# Patient Record
Sex: Female | Born: 1985
Health system: Southern US, Community
[De-identification: ages and names within clinical notes are randomized; demographics above are authoritative.]

## PROBLEM LIST (undated history)

## (undated) DIAGNOSIS — O99891 Other specified diseases and conditions complicating pregnancy: Secondary | ICD-10-CM

## (undated) DIAGNOSIS — B009 Herpesviral infection, unspecified: Secondary | ICD-10-CM

## (undated) DIAGNOSIS — O98519 Other viral diseases complicating pregnancy, unspecified trimester: Secondary | ICD-10-CM

## (undated) DIAGNOSIS — I509 Heart failure, unspecified: Secondary | ICD-10-CM

## (undated) DIAGNOSIS — Z8659 Personal history of other mental and behavioral disorders: Secondary | ICD-10-CM

## (undated) DIAGNOSIS — Z87898 Personal history of other specified conditions: Secondary | ICD-10-CM

## (undated) DIAGNOSIS — Z3490 Encounter for supervision of normal pregnancy, unspecified, unspecified trimester: Secondary | ICD-10-CM

## (undated) DIAGNOSIS — A549 Gonococcal infection, unspecified: Secondary | ICD-10-CM

## (undated) DIAGNOSIS — O9989 Other specified diseases and conditions complicating pregnancy, childbirth and the puerperium: Secondary | ICD-10-CM

## (undated) DIAGNOSIS — O280 Abnormal hematological finding on antenatal screening of mother: Secondary | ICD-10-CM

## (undated) DIAGNOSIS — Z720 Tobacco use: Secondary | ICD-10-CM

## (undated) DIAGNOSIS — E0500 Thyrotoxicosis with diffuse goiter without thyrotoxic crisis or storm: Secondary | ICD-10-CM

## (undated) DIAGNOSIS — F99 Mental disorder, not otherwise specified: Secondary | ICD-10-CM

## (undated) DIAGNOSIS — F419 Anxiety disorder, unspecified: Secondary | ICD-10-CM

## (undated) HISTORY — DX: Herpesviral infection, unspecified: O98.519

## (undated) HISTORY — PX: TONSILLECTOMY: SUR1361

## (undated) HISTORY — DX: Personal history of other mental and behavioral disorders: Z86.59

## (undated) HISTORY — DX: Other specified diseases and conditions complicating pregnancy: O99.891

## (undated) HISTORY — DX: Heart failure, unspecified: I50.9

## (undated) HISTORY — DX: Thyrotoxicosis with diffuse goiter without thyrotoxic crisis or storm: E05.00

## (undated) HISTORY — DX: Mental disorder, not otherwise specified: F99

## (undated) HISTORY — DX: Abnormal hematological finding on antenatal screening of mother: O28.0

## (undated) HISTORY — DX: Personal history of other specified conditions: Z87.898

## (undated) HISTORY — PX: TOE SURGERY: SHX1073

## (undated) HISTORY — DX: Herpesviral infection, unspecified: B00.9

## (undated) HISTORY — DX: Other specified diseases and conditions complicating pregnancy, childbirth and the puerperium: O99.89

---
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
## 2008-05-02 ENCOUNTER — Emergency Department (HOSPITAL_COMMUNITY): Admission: EM | Admit: 2008-05-02 | Discharge: 2008-05-02 | Payer: Self-pay | Admitting: Emergency Medicine

## 2008-05-11 ENCOUNTER — Emergency Department (HOSPITAL_COMMUNITY): Admission: EM | Admit: 2008-05-11 | Discharge: 2008-05-11 | Payer: Self-pay | Admitting: Emergency Medicine

## 2010-08-06 LAB — ABO/RH: RH Type: POSITIVE

## 2010-08-06 LAB — RUBELLA ANTIBODY, IGM: Rubella: IMMUNE

## 2010-08-06 LAB — HEPATITIS B SURFACE ANTIGEN: Hepatitis B Surface Ag: NEGATIVE

## 2010-08-06 LAB — ANTIBODY SCREEN: Antibody Screen: NEGATIVE

## 2010-08-27 ENCOUNTER — Other Ambulatory Visit: Payer: Self-pay | Admitting: Obstetrics and Gynecology

## 2010-08-27 ENCOUNTER — Other Ambulatory Visit (HOSPITAL_COMMUNITY)
Admission: RE | Admit: 2010-08-27 | Discharge: 2010-08-27 | Disposition: A | Discharge status: Home / Self Care | Payer: Medicaid Other | Source: Ambulatory Visit | Attending: Obstetrics and Gynecology | Admitting: Obstetrics and Gynecology

## 2010-08-27 DIAGNOSIS — Z113 Encounter for screening for infections with a predominantly sexual mode of transmission: Secondary | ICD-10-CM | POA: Insufficient documentation

## 2010-08-27 DIAGNOSIS — R87810 Cervical high risk human papillomavirus (HPV) DNA test positive: Secondary | ICD-10-CM | POA: Insufficient documentation

## 2010-08-27 DIAGNOSIS — Z01419 Encounter for gynecological examination (general) (routine) without abnormal findings: Secondary | ICD-10-CM | POA: Insufficient documentation

## 2010-10-19 NOTE — Op Note (Signed)
NAME:  Pamela [name], Pamela [name]                  ACCOUNT NO.:  [id number]   MEDICAL RECORD NO.:  [id number]          PATIENT TYPE:  AMB   LOCATION:  DSC                          FACILITY:  [facility]   PHYSICIAN:  Lucky Cowboy, MD         DATE OF BIRTH:  11/29/1985   DATE OF PROCEDURE:  01/04/2005  DATE OF DISCHARGE:  01/04/2005                                 OPERATIVE REPORT   PREOPERATIVE DIAGNOSIS:  Obstructive sleep apnea due to adenotonsillar  hypertrophy with chronic tonsillitis.   SURGEON:  Lucky Cowboy, MD   ANESTHESIA:  General endotracheal anesthesia.   ESTIMATED BLOOD LOSS:  30 mL.   SPECIMENS:  Tonsils and adenoids.   COMPLICATIONS:  None.   INDICATIONS:  This patient is a 25 year old female with chronic sore throat  over the past 5 years.  There is occasional Strep throat.  There is chronic  halitosis.  There is struggling to breathe with apnea at night.  She was  found to have kissing bilateral palatine tonsils.  For these reasons,  adenotonsillectomy is performed.   FINDINGS:  The patient was noted to have a profuse amount of adenotonsillar  hypertrophy with kissing bilateral palatine tonsils.   PROCEDURE:  The patient was taken to the operating room and placed on the  table in the supine position.  She was then placed under general  endotracheal anesthesia and the table rotated counterclockwise 90 degrees.  The neck was gently extended.  A Crowe-Davis mouth gag with a #3 tongue  blade was then placed intraorally, opened and suspended on the Mayo stand.  Palpation of the soft palate was without evidence of a submucosal cleft.  The tonsillectomy was performed first.  Allis clamp was used to grasp the  right palatine tonsil.  Harmonic scalpel was then used to excise the tonsil,  staying within the peritonsillar space adjacent to the tonsillar capsule.  A  small amount of cauterization was required in the right superior tonsillar  pole.  The left palatine tonsil was removed in  identical fashion.  The  palate was then re-elevated and a large adenoid curette was placed against  the vomer and direct inferiorly, severing the majority adenoid pad.  Subsequent passes were required.  Two sterile gauze Afrin-soaked packs were  placed in the nasopharynx and time allowed for hemostasis.  After allowing  time for hemostasis, the packs were removed and suction cautery performed.  The nasopharynx was copiously irrigated transnasally with normal saline,  which was suctioned out through the oral cavity.  An NG tube was placed down  the esophagus for  suctioning of the gastric contents.  The mouth gag was removed, noting no  damage to the teeth or soft tissues.  The table was rotated clockwise 90  degrees to its original position.  The patient was awakened from anesthesia  and taken to the postanesthesia care unit in stable condition.  There were  no complications.      Lucky Cowboy, MD  Electronically Signed     SJ/MEDQ  D:  01/06/2005  T:  01/07/2005  Job:  [id number]   cc:   [facility]   Day, Molly Maduro MD

## 2011-03-03 ENCOUNTER — Inpatient Hospital Stay (HOSPITAL_COMMUNITY)
Admission: AD | Admit: 2011-03-03 | Discharge: 2011-03-07 | DRG: 766 | Disposition: A | Discharge status: Home / Self Care | Payer: Medicaid Other | Source: Ambulatory Visit | Attending: Obstetrics & Gynecology | Admitting: Obstetrics & Gynecology

## 2011-03-03 ENCOUNTER — Encounter (HOSPITAL_COMMUNITY): Payer: Self-pay

## 2011-03-03 HISTORY — DX: Anxiety disorder, unspecified: F41.9

## 2011-03-03 LAB — CBC
HCT: 36.9 % (ref 36.0–46.0)
Hemoglobin: 12.3 g/dL (ref 12.0–15.0)
MCH: 28.3 pg (ref 26.0–34.0)
MCV: 85 fL (ref 78.0–100.0)
RBC: 4.34 MIL/uL (ref 3.87–5.11)

## 2011-03-03 LAB — COMPREHENSIVE METABOLIC PANEL
AST: 14 U/L (ref 0–37)
CO2: 21 mEq/L (ref 19–32)
Chloride: 105 mEq/L (ref 96–112)
Creatinine, Ser: <0.47 mg/dL — ABNORMAL LOW (ref 0.50–1.10)
Total Bilirubin: 0.2 mg/dL — ABNORMAL LOW (ref 0.3–1.2)

## 2011-03-03 MED ORDER — OXYCODONE-ACETAMINOPHEN 5-325 MG PO TABS: 2.0000 | ORAL_TABLET | ORAL | Status: DC | PRN

## 2011-03-03 MED ORDER — NALBUPHINE SYRINGE 5 MG/0.5 ML
10.0000 mg | INJECTION | INTRAMUSCULAR | Status: DC | PRN
  Administered 2011-03-03 (×2): 10 mg via INTRAVENOUS
  Filled 2011-03-03 (×3): qty 1

## 2011-03-03 MED ORDER — LACTATED RINGERS IV SOLN
INTRAVENOUS | Status: DC
  Administered 2011-03-03: 23:00:00 via INTRAVENOUS

## 2011-03-03 MED ORDER — LACTATED RINGERS IV SOLN
500.0000 mL | INTRAVENOUS | Status: DC | PRN
  Administered 2011-03-04: 1000 mL via INTRAVENOUS
  Administered 2011-03-04: 500 mL via INTRAVENOUS

## 2011-03-03 MED ORDER — OXYTOCIN BOLUS FROM INFUSION
500.0000 mL | Freq: Once | INTRAVENOUS | Status: DC
  Filled 2011-03-03: qty 500

## 2011-03-03 MED ORDER — OXYTOCIN 20 UNITS IN LACTATED RINGERS INFUSION - SIMPLE: 125.0000 mL/h | Freq: Once | INTRAVENOUS | Status: DC

## 2011-03-03 MED ORDER — LIDOCAINE HCL (PF) 1 % IJ SOLN
30.0000 mL | INTRAMUSCULAR | Status: DC | PRN
  Filled 2011-03-03: qty 30

## 2011-03-03 MED ORDER — IBUPROFEN 600 MG PO TABS: 600.0000 mg | ORAL_TABLET | Freq: Four times a day (QID) | ORAL | Status: DC | PRN

## 2011-03-03 MED ORDER — FLEET ENEMA 7-19 GM/118ML RE ENEM: 1.0000 | ENEMA | RECTAL | Status: DC | PRN

## 2011-03-03 MED ORDER — CITRIC ACID-SODIUM CITRATE 334-500 MG/5ML PO SOLN
30.0000 mL | ORAL | Status: DC | PRN
  Administered 2011-03-04: 30 mL via ORAL
  Filled 2011-03-03: qty 15

## 2011-03-03 MED ORDER — LACTATED RINGERS IV BOLUS (SEPSIS)
500.0000 mL | Freq: Once | INTRAVENOUS | Status: AC
  Administered 2011-03-03: 500 mL via INTRAVENOUS

## 2011-03-03 MED ORDER — ONDANSETRON HCL 4 MG/2ML IJ SOLN: 4.0000 mg | Freq: Four times a day (QID) | INTRAMUSCULAR | Status: DC | PRN

## 2011-03-03 MED ORDER — ACETAMINOPHEN 325 MG PO TABS: 650.0000 mg | ORAL_TABLET | ORAL | Status: DC | PRN

## 2011-03-03 NOTE — Progress Notes (Signed)
Report given to Jodi, RN

## 2011-03-03 NOTE — H&P (Signed)
Pamela [name] is a 25 y.o. female presenting for earley labor and variable decels on admit. Maternal Medical History:  Reason for admission: Reason for admission: contractions.  Contractions: Onset was 3-5 hours ago.   Frequency: regular.   Perceived severity is mild.    Fetal activity: Perceived fetal activity is normal.   Last perceived fetal movement was within the past hour.      OB History    Grav Para Term Preterm Abortions TAB SAB Ect Mult Living   2 0 0 0 1 0 1 0 0 0      Past Medical History  Diagnosis Date  . Genital herpes    No past surgical history on file. Family History: family history is not on file. Social History:  does not have a smoking history on file. She does not have any smokeless tobacco history on file. Her alcohol and drug histories not on file.  Review of Systems  Constitutional: Negative.   HENT: Negative.   Eyes: Negative.   Respiratory: Negative.   Cardiovascular: Negative.   Gastrointestinal: Negative.   Genitourinary: Negative.   Musculoskeletal: Negative.   Skin: Negative.   Neurological: Negative.   Endo/Heme/Allergies: Negative.   Psychiatric/Behavioral: Negative.     Dilation: 1.5 Effacement (%): 60 Station: -1 Exam by:: J. Rasch RN  Blood pressure 151/99, pulse 98, temperature 98.2 F (36.8 C), temperature source Oral, resp. rate 16, height 5\' 10"  (1.778 m), weight 146.965 kg (324 lb). Maternal Exam:  Uterine Assessment: Contraction strength is mild.  Contraction frequency is regular.   Abdomen: Patient reports no abdominal tenderness. Fetal presentation: vertex  Introitus: Normal vulva. Normal vagina.    Physical Exam  Constitutional: She is oriented to person, place, and time. She appears well-developed and well-nourished.  HENT:  Head: Normocephalic.  Neck: Normal range of motion.  Cardiovascular: Normal rate, regular rhythm and normal heart sounds.   Respiratory: Effort normal and breath sounds normal.  GI: Soft.  Bowel sounds are normal.  Genitourinary: Vagina normal and uterus normal.  Musculoskeletal: Normal range of motion.  Neurological: She is alert and oriented to person, place, and time. She has normal reflexes.  Skin: Skin is warm and dry.  Psychiatric: She has a normal mood and affect. Her behavior is normal. Judgment and thought content normal.    Prenatal labs: ABO, Rh:   Antibody:   Rubella:   RPR:    HBsAg:    HIV:    GBS:   neg  Assessment/Plan: Adnit, pit aug and deliver.   Zerita Boers 03/03/2011, 7:44 PM

## 2011-03-03 NOTE — Progress Notes (Signed)
Contractions since early this morning, contractions much worse stronger and closer together, G1 39 weeks

## 2011-03-03 NOTE — Progress Notes (Signed)
  2100 SVE 2-3/90/-1 AROM mod mec IUPC and FSE placed.

## 2011-03-03 NOTE — Plan of Care (Signed)
Problem: Consults Goal: Birthing Suites Patient Information Press F2 to bring up selections list  Outcome: Completed/Met Date Met:  03/03/11  Pt 37-[redacted] weeks EGA

## 2011-03-03 NOTE — Progress Notes (Signed)
Pt presents to MAU with complaints of contractions. Pt states the contractions have kept her up all night. Pt is a G2P0

## 2011-03-03 NOTE — Progress Notes (Signed)
Pamela [name] Returned page. Notified her of patient arrival for labor eval. G2P0 39 weeks contracting 2-3 mins with variable decels.Pt very uncomfortable, thrashing in bed. Orders received.

## 2011-03-03 NOTE — Progress Notes (Signed)
Unable to reach Marlynn Perking CNM- Over head page done.

## 2011-03-04 ENCOUNTER — Encounter (HOSPITAL_COMMUNITY): Payer: Self-pay | Admitting: Anesthesiology

## 2011-03-04 ENCOUNTER — Inpatient Hospital Stay (HOSPITAL_COMMUNITY): Payer: Medicaid Other | Admitting: Anesthesiology

## 2011-03-04 ENCOUNTER — Other Ambulatory Visit: Payer: Self-pay | Admitting: Obstetrics & Gynecology

## 2011-03-04 ENCOUNTER — Encounter (HOSPITAL_COMMUNITY): Payer: Self-pay

## 2011-03-04 ENCOUNTER — Encounter (HOSPITAL_COMMUNITY)
Admission: AD | Disposition: A | Discharge status: Home / Self Care | Payer: Self-pay | Source: Ambulatory Visit | Attending: Obstetrics & Gynecology

## 2011-03-04 DIAGNOSIS — O368990 Maternal care for other specified fetal problems, unspecified trimester, not applicable or unspecified: Secondary | ICD-10-CM

## 2011-03-04 DIAGNOSIS — O43899 Other placental disorders, unspecified trimester: Secondary | ICD-10-CM

## 2011-03-04 DIAGNOSIS — O368390 Maternal care for abnormalities of the fetal heart rate or rhythm, unspecified trimester, not applicable or unspecified: Secondary | ICD-10-CM

## 2011-03-04 LAB — PROTEIN / CREATININE RATIO, URINE: Total Protein, Urine: 16.6 mg/dL

## 2011-03-04 LAB — RPR: RPR Ser Ql: NONREACTIVE

## 2011-03-04 SURGERY — Surgical Case
Anesthesia: Epidural | Site: Abdomen | Wound class: Clean Contaminated

## 2011-03-04 MED ORDER — OXYTOCIN 10 UNIT/ML IJ SOLN
INTRAMUSCULAR | Status: AC
  Filled 2011-03-04: qty 4

## 2011-03-04 MED ORDER — CLINDAMYCIN PHOSPHATE 600 MG/50ML IV SOLN
INTRAVENOUS | Status: DC | PRN
  Administered 2011-03-04: 900 mg via INTRAVENOUS

## 2011-03-04 MED ORDER — LACTATED RINGERS IV SOLN
INTRAVENOUS | Status: DC | PRN
  Administered 2011-03-04 (×2): via INTRAVENOUS

## 2011-03-04 MED ORDER — ONDANSETRON HCL 4 MG/2ML IJ SOLN: 4.0000 mg | INTRAMUSCULAR | Status: DC | PRN

## 2011-03-04 MED ORDER — SODIUM CHLORIDE 0.9 % IJ SOLN: 3.0000 mL | INTRAMUSCULAR | Status: DC | PRN

## 2011-03-04 MED ORDER — FENTANYL 2.5 MCG/ML BUPIVACAINE 1/10 % EPIDURAL INFUSION (WH - ANES)
14.0000 mL/h | INTRAMUSCULAR | Status: DC
  Administered 2011-03-04: 14 mL/h via EPIDURAL
  Filled 2011-03-04: qty 60

## 2011-03-04 MED ORDER — KETOROLAC TROMETHAMINE 30 MG/ML IJ SOLN
30.0000 mg | Freq: Four times a day (QID) | INTRAMUSCULAR | Status: AC | PRN
  Administered 2011-03-04: 30 mg via INTRAVENOUS

## 2011-03-04 MED ORDER — OXYTOCIN 20 UNITS IN LACTATED RINGERS INFUSION - SIMPLE: 125.0000 mL/h | INTRAVENOUS | Status: AC

## 2011-03-04 MED ORDER — SIMETHICONE 80 MG PO CHEW
80.0000 mg | CHEWABLE_TABLET | ORAL | Status: DC | PRN
  Administered 2011-03-04 – 2011-03-06 (×7): 80 mg via ORAL

## 2011-03-04 MED ORDER — DIPHENHYDRAMINE HCL 25 MG PO CAPS: 25.0000 mg | ORAL_CAPSULE | Freq: Four times a day (QID) | ORAL | Status: DC | PRN

## 2011-03-04 MED ORDER — SODIUM CHLORIDE 0.9 % IV SOLN
1.0000 ug/kg/h | INTRAVENOUS | Status: DC | PRN
  Filled 2011-03-04: qty 2.5

## 2011-03-04 MED ORDER — IBUPROFEN 600 MG PO TABS
600.0000 mg | ORAL_TABLET | Freq: Four times a day (QID) | ORAL | Status: DC | PRN
  Filled 2011-03-04: qty 1

## 2011-03-04 MED ORDER — LANOLIN HYDROUS EX OINT: 1.0000 "application " | TOPICAL_OINTMENT | CUTANEOUS | Status: DC | PRN

## 2011-03-04 MED ORDER — NALOXONE HCL 0.4 MG/ML IJ SOLN: 0.4000 mg | INTRAMUSCULAR | Status: DC | PRN

## 2011-03-04 MED ORDER — DIPHENHYDRAMINE HCL 50 MG/ML IJ SOLN: 25.0000 mg | INTRAMUSCULAR | Status: DC | PRN

## 2011-03-04 MED ORDER — KETOROLAC TROMETHAMINE 30 MG/ML IJ SOLN
INTRAMUSCULAR | Status: AC
  Administered 2011-03-04: 30 mg via INTRAVENOUS
  Filled 2011-03-04: qty 1

## 2011-03-04 MED ORDER — MEPERIDINE HCL 25 MG/ML IJ SOLN
INTRAMUSCULAR | Status: DC | PRN
  Administered 2011-03-04: 25 mg via INTRAVENOUS

## 2011-03-04 MED ORDER — METOCLOPRAMIDE HCL 5 MG/ML IJ SOLN
10.0000 mg | Freq: Three times a day (TID) | INTRAMUSCULAR | Status: DC | PRN
  Administered 2011-03-04: 10 mg via INTRAVENOUS
  Filled 2011-03-04: qty 2

## 2011-03-04 MED ORDER — NALBUPHINE HCL 10 MG/ML IJ SOLN
5.0000 mg | INTRAMUSCULAR | Status: DC | PRN
  Filled 2011-03-04: qty 1

## 2011-03-04 MED ORDER — PRENATAL PLUS 27-1 MG PO TABS
1.0000 | ORAL_TABLET | Freq: Every day | ORAL | Status: DC
  Administered 2011-03-04 – 2011-03-07 (×4): 1 via ORAL
  Filled 2011-03-04 (×4): qty 1

## 2011-03-04 MED ORDER — PHENYLEPHRINE 40 MCG/ML (10ML) SYRINGE FOR IV PUSH (FOR BLOOD PRESSURE SUPPORT)
80.0000 ug | PREFILLED_SYRINGE | INTRAVENOUS | Status: DC | PRN
  Filled 2011-03-04: qty 5

## 2011-03-04 MED ORDER — ONDANSETRON HCL 4 MG/2ML IJ SOLN
INTRAMUSCULAR | Status: AC
  Filled 2011-03-04: qty 2

## 2011-03-04 MED ORDER — TETANUS-DIPHTH-ACELL PERTUSSIS 5-2.5-18.5 LF-MCG/0.5 IM SUSP
0.5000 mL | Freq: Once | INTRAMUSCULAR | Status: AC
  Administered 2011-03-05: 0.5 mL via INTRAMUSCULAR
  Filled 2011-03-04: qty 0.5

## 2011-03-04 MED ORDER — FENTANYL CITRATE 0.05 MG/ML IJ SOLN
INTRAMUSCULAR | Status: AC
  Filled 2011-03-04: qty 2

## 2011-03-04 MED ORDER — LACTATED RINGERS IV SOLN
500.0000 mL | Freq: Once | INTRAVENOUS | Status: AC
  Administered 2011-03-04: 500 mL via INTRAVENOUS

## 2011-03-04 MED ORDER — DIPHENHYDRAMINE HCL 50 MG/ML IJ SOLN: 12.5000 mg | INTRAMUSCULAR | Status: DC | PRN

## 2011-03-04 MED ORDER — ONDANSETRON HCL 4 MG/2ML IJ SOLN
INTRAMUSCULAR | Status: DC | PRN
  Administered 2011-03-04: 4 mg via INTRAVENOUS

## 2011-03-04 MED ORDER — WITCH HAZEL-GLYCERIN EX PADS: 1.0000 "application " | MEDICATED_PAD | CUTANEOUS | Status: DC | PRN

## 2011-03-04 MED ORDER — MEASLES, MUMPS & RUBELLA VAC ~~LOC~~ INJ
0.5000 mL | INJECTION | Freq: Once | SUBCUTANEOUS | Status: DC
  Filled 2011-03-04: qty 0.5

## 2011-03-04 MED ORDER — FENTANYL CITRATE 0.05 MG/ML IJ SOLN: 25.0000 ug | INTRAMUSCULAR | Status: DC | PRN

## 2011-03-04 MED ORDER — ONDANSETRON HCL 4 MG PO TABS: 4.0000 mg | ORAL_TABLET | ORAL | Status: DC | PRN

## 2011-03-04 MED ORDER — EPHEDRINE 5 MG/ML INJ
10.0000 mg | INTRAVENOUS | Status: DC | PRN
  Filled 2011-03-04 (×2): qty 4

## 2011-03-04 MED ORDER — PROMETHAZINE HCL 25 MG/ML IJ SOLN: 6.2500 mg | INTRAMUSCULAR | Status: DC | PRN

## 2011-03-04 MED ORDER — SCOPOLAMINE 1 MG/3DAYS TD PT72
1.0000 | MEDICATED_PATCH | Freq: Once | TRANSDERMAL | Status: AC
  Administered 2011-03-04: 1.5 mg via TRANSDERMAL

## 2011-03-04 MED ORDER — MENTHOL 3 MG MT LOZG: 1.0000 | LOZENGE | OROMUCOSAL | Status: DC | PRN

## 2011-03-04 MED ORDER — KETOROLAC TROMETHAMINE 30 MG/ML IJ SOLN: 30.0000 mg | Freq: Four times a day (QID) | INTRAMUSCULAR | Status: AC | PRN

## 2011-03-04 MED ORDER — SCOPOLAMINE 1 MG/3DAYS TD PT72
MEDICATED_PATCH | TRANSDERMAL | Status: AC
  Administered 2011-03-04: 1.5 mg via TRANSDERMAL
  Filled 2011-03-04: qty 1

## 2011-03-04 MED ORDER — OXYTOCIN 10 UNIT/ML IJ SOLN
40.0000 [IU] | INTRAVENOUS | Status: DC | PRN
  Administered 2011-03-04: 40 [IU] via INTRAVENOUS

## 2011-03-04 MED ORDER — DIPHENHYDRAMINE HCL 25 MG PO CAPS
25.0000 mg | ORAL_CAPSULE | ORAL | Status: DC | PRN
  Administered 2011-03-04 (×2): 25 mg via ORAL
  Filled 2011-03-04 (×2): qty 1

## 2011-03-04 MED ORDER — FENTANYL CITRATE 0.05 MG/ML IJ SOLN
INTRAMUSCULAR | Status: DC | PRN
  Administered 2011-03-04: 100 ug via INTRAVENOUS

## 2011-03-04 MED ORDER — IBUPROFEN 600 MG PO TABS
600.0000 mg | ORAL_TABLET | Freq: Four times a day (QID) | ORAL | Status: DC
  Administered 2011-03-04 – 2011-03-07 (×12): 600 mg via ORAL
  Filled 2011-03-04 (×12): qty 1

## 2011-03-04 MED ORDER — LIDOCAINE HCL 1.5 % IJ SOLN
INTRAMUSCULAR | Status: DC | PRN
  Administered 2011-03-04 (×2): 5 mL via INTRADERMAL

## 2011-03-04 MED ORDER — DIPHENHYDRAMINE HCL 50 MG/ML IJ SOLN
12.5000 mg | INTRAMUSCULAR | Status: DC | PRN
Start: 2011-03-04 — End: 2011-03-07
  Administered 2011-03-04: 50 mg via INTRAVENOUS
  Filled 2011-03-04: qty 1

## 2011-03-04 MED ORDER — PHENYLEPHRINE 40 MCG/ML (10ML) SYRINGE FOR IV PUSH (FOR BLOOD PRESSURE SUPPORT)
80.0000 ug | PREFILLED_SYRINGE | INTRAVENOUS | Status: DC | PRN
  Filled 2011-03-04 (×2): qty 5

## 2011-03-04 MED ORDER — MORPHINE SULFATE (PF) 0.5 MG/ML IJ SOLN
INTRAMUSCULAR | Status: DC | PRN
  Administered 2011-03-04: 4 mg via EPIDURAL

## 2011-03-04 MED ORDER — PHENYLEPHRINE HCL 10 MG/ML IJ SOLN
INTRAMUSCULAR | Status: DC | PRN
  Administered 2011-03-04: 120 ug via INTRAVENOUS
  Administered 2011-03-04: 80 ug via INTRAVENOUS
  Administered 2011-03-04: 120 ug via INTRAVENOUS
  Administered 2011-03-04: 80 ug via INTRAVENOUS

## 2011-03-04 MED ORDER — ACETAMINOPHEN 325 MG PO TABS: 325.0000 mg | ORAL_TABLET | ORAL | Status: DC | PRN

## 2011-03-04 MED ORDER — OXYTOCIN 10 UNIT/ML IJ SOLN
INTRAMUSCULAR | Status: AC
  Filled 2011-03-04: qty 2

## 2011-03-04 MED ORDER — OXYCODONE-ACETAMINOPHEN 5-325 MG PO TABS
1.0000 | ORAL_TABLET | ORAL | Status: DC | PRN
  Administered 2011-03-05 (×5): 1 via ORAL
  Administered 2011-03-06 (×2): 2 via ORAL
  Administered 2011-03-07 (×2): 1 via ORAL
  Filled 2011-03-04 (×6): qty 1
  Filled 2011-03-04 (×2): qty 2
  Filled 2011-03-04: qty 1

## 2011-03-04 MED ORDER — LACTATED RINGERS IV SOLN
INTRAVENOUS | Status: DC
  Administered 2011-03-04 (×2): via INTRAVENOUS

## 2011-03-04 MED ORDER — MEPERIDINE HCL 25 MG/ML IJ SOLN
INTRAMUSCULAR | Status: AC
  Filled 2011-03-04: qty 1

## 2011-03-04 MED ORDER — LACTATED RINGERS IV SOLN
INTRAVENOUS | Status: DC
  Administered 2011-03-04: 01:00:00 via INTRAUTERINE

## 2011-03-04 MED ORDER — EPHEDRINE 5 MG/ML INJ
10.0000 mg | INTRAVENOUS | Status: DC | PRN
  Filled 2011-03-04: qty 4

## 2011-03-04 MED ORDER — DEXTROSE 5 % IV SOLN
Freq: Three times a day (TID) | INTRAVENOUS | Status: AC
  Filled 2011-03-04: qty 6

## 2011-03-04 MED ORDER — KETOROLAC TROMETHAMINE 30 MG/ML IJ SOLN: 15.0000 mg | Freq: Once | INTRAMUSCULAR | Status: DC | PRN

## 2011-03-04 MED ORDER — PHENYLEPHRINE 40 MCG/ML (10ML) SYRINGE FOR IV PUSH (FOR BLOOD PRESSURE SUPPORT)
PREFILLED_SYRINGE | INTRAVENOUS | Status: AC
  Filled 2011-03-04: qty 10

## 2011-03-04 MED ORDER — SODIUM BICARBONATE 8.4 % IV SOLN
INTRAVENOUS | Status: DC | PRN
  Administered 2011-03-04: 15 mL via EPIDURAL

## 2011-03-04 MED ORDER — ONDANSETRON HCL 4 MG/2ML IJ SOLN: 4.0000 mg | Freq: Three times a day (TID) | INTRAMUSCULAR | Status: DC | PRN

## 2011-03-04 MED ORDER — MORPHINE SULFATE (PF) 0.5 MG/ML IJ SOLN
INTRAMUSCULAR | Status: DC | PRN
  Administered 2011-03-04: 1 mg via INTRAVENOUS

## 2011-03-04 MED ORDER — MEPERIDINE HCL 25 MG/ML IJ SOLN: 6.2500 mg | INTRAMUSCULAR | Status: DC | PRN

## 2011-03-04 MED ORDER — DIBUCAINE 1 % RE OINT: 1.0000 "application " | TOPICAL_OINTMENT | RECTAL | Status: DC | PRN

## 2011-03-04 MED ORDER — MORPHINE SULFATE 0.5 MG/ML IJ SOLN
INTRAMUSCULAR | Status: AC
  Filled 2011-03-04: qty 10

## 2011-03-04 SURGICAL SUPPLY — 33 items
CHLORAPREP W/TINT 26ML (MISCELLANEOUS) ×2 IMPLANT
CLOTH BEACON ORANGE TIMEOUT ST (SAFETY) ×2 IMPLANT
CONTAINER PREFILL 10% NBF 15ML (MISCELLANEOUS) IMPLANT
DRAIN JACKSON PRT FLT 7MM (DRAIN) IMPLANT
DRESSING TELFA 8X3 (GAUZE/BANDAGES/DRESSINGS) ×1 IMPLANT
DRSG PAD ABDOMINAL 8X10 ST (GAUZE/BANDAGES/DRESSINGS) ×1 IMPLANT
ELECT REM PT RETURN 9FT ADLT (ELECTROSURGICAL) ×2
ELECTRODE REM PT RTRN 9FT ADLT (ELECTROSURGICAL) ×1 IMPLANT
EVACUATOR SILICONE 100CC (DRAIN) IMPLANT
EXTRACTOR VACUUM M CUP 4 TUBE (SUCTIONS) IMPLANT
GAUZE SPONGE 4X4 12PLY STRL LF (GAUZE/BANDAGES/DRESSINGS) ×4 IMPLANT
GLOVE BIO SURGEON STRL SZ7 (GLOVE) ×2 IMPLANT
GLOVE BIOGEL PI IND STRL 7.0 (GLOVE) ×2 IMPLANT
GLOVE BIOGEL PI INDICATOR 7.0 (GLOVE) ×2
GOWN PREVENTION PLUS LG XLONG (DISPOSABLE) ×6 IMPLANT
KIT ABG SYR 3ML LUER SLIP (SYRINGE) ×1 IMPLANT
NDL HYPO 25X5/8 SAFETYGLIDE (NEEDLE) ×1 IMPLANT
NEEDLE HYPO 25X5/8 SAFETYGLIDE (NEEDLE) ×2 IMPLANT
NS IRRIG 1000ML POUR BTL (IV SOLUTION) ×2 IMPLANT
PACK C SECTION WH (CUSTOM PROCEDURE TRAY) ×2 IMPLANT
PAD ABD 7.5X8 STRL (GAUZE/BANDAGES/DRESSINGS) ×1 IMPLANT
RTRCTR C-SECT PINK 25CM LRG (MISCELLANEOUS) ×2 IMPLANT
SLEEVE SCD COMPRESS KNEE MED (MISCELLANEOUS) ×1 IMPLANT
SPONGE GAUZE 4X4 12PLY (GAUZE/BANDAGES/DRESSINGS) ×1 IMPLANT
STAPLER VISISTAT 35W (STAPLE) ×1 IMPLANT
SUT PLAIN 2 0 XLH (SUTURE) ×1 IMPLANT
SUT VIC AB 0 CTX 36 (SUTURE) ×10
SUT VIC AB 0 CTX36XBRD ANBCTRL (SUTURE) ×5 IMPLANT
SUT VIC AB 4-0 KS 27 (SUTURE) IMPLANT
TAPE CLOTH SURG 4X10 WHT LF (GAUZE/BANDAGES/DRESSINGS) ×1 IMPLANT
TOWEL OR 17X24 6PK STRL BLUE (TOWEL DISPOSABLE) ×4 IMPLANT
TRAY FOLEY CATH 14FR (SET/KITS/TRAYS/PACK) ×1 IMPLANT
WATER STERILE IRR 1000ML POUR (IV SOLUTION) ×2 IMPLANT

## 2011-03-04 NOTE — Anesthesia Preprocedure Evaluation (Addendum)
Anesthesia Evaluation  Name, MR# and DOB Patient awake  General Assessment Comment  Reviewed: Allergy & Precautions, H&P , Patient's Chart, lab work & pertinent test results  Airway Mallampati: IV TM Distance: >3 FB Neck ROM: full    Dental No notable dental hx.    Pulmonary  clear to auscultation  Pulmonary exam normal       Cardiovascular regular Normal    Neuro/Psych Negative Neurological ROS  Negative Psych ROS   GI/Hepatic negative GI ROS Neg liver ROS    Endo/Other  Negative Endocrine ROSMorbid obesity  Renal/GU negative Renal ROS     Musculoskeletal   Abdominal   Peds  Hematology negative hematology ROS (+)   Anesthesia Other Findings   Reproductive/Obstetrics (+) Pregnancy                           Anesthesia Physical Anesthesia Plan  ASA: III  Anesthesia Plan: Epidural   Post-op Pain Management:    Induction:   Airway Management Planned:   Additional Equipment:   Intra-op Plan:   Post-operative Plan:   Informed Consent: I have reviewed the patients History and Physical, chart, labs and discussed the procedure including the risks, benefits and alternatives for the proposed anesthesia with the patient or authorized representative who has indicated his/her understanding and acceptance.     Plan Discussed with:   Anesthesia Plan Comments:         Anesthesia Quick Evaluation

## 2011-03-04 NOTE — Progress Notes (Signed)
UR chart review completed.  

## 2011-03-04 NOTE — Progress Notes (Signed)
Pt in OR .

## 2011-03-04 NOTE — Consult Note (Signed)
Called to attend primary C/S for non reassuring FHR to term gestation pregnancy. Also reported is moderate MSF c < 12 hrs of ROM.   At delivery infant in vertex, manually extracted with spontaneous cries and active tone. Laryngscopy deferred. Placed under radiant warmer; minimal meconium staining noted. Also cafe au lait on right forearm, left testis not descended or palpated in canal. Right testis in scrotum.  Given tactile stimulation and bulb suction to naso/oropharynx  Yielding minimal fluid.  Shown to mother and then carried to transitional nursery.    Care to assigned Pediatrician  James Lafalce. Alphonsa Gin MD [facility]

## 2011-03-04 NOTE — Progress Notes (Signed)
Dr Penne Lash called emergent c-section.

## 2011-03-04 NOTE — Anesthesia Procedure Notes (Signed)
Epidural Patient location during procedure: OB Start time: 03/04/2011 12:54 AM  Staffing Performed by: anesthesiologist   Preanesthetic Checklist Completed: patient identified, site marked, surgical consent, pre-op evaluation, timeout performed, IV checked, risks and benefits discussed and monitors and equipment checked  Epidural Patient position: sitting Prep: site prepped and draped and DuraPrep Patient monitoring: continuous pulse ox and blood pressure Approach: midline Injection technique: LOR air and LOR saline  Needle:  Needle type: Tuohy  Needle gauge: 17 G Needle length: 9 cm Needle insertion depth: 9 cm Catheter type: closed end flexible Catheter size: 19 Gauge Catheter at skin depth: 14 cm Test dose: negative  Assessment Events: blood not aspirated, injection not painful, no injection resistance, negative IV test and no paresthesia  Additional Notes Patient identified.  Risk benefits discussed including failed block, incomplete pain control, headache, nerve damage, paralysis, blood pressure changes, nausea, vomiting, reactions to medication both toxic or allergic, and postpartum back pain.  Patient expressed understanding and wished to proceed.  All questions were answered.  Sterile technique used throughout procedure and epidural site dressed with sterile barrier dressing. No paresthesia or other complications noted.The patient did not experience any signs of intravascular injection such as tinnitus or metallic taste in mouth nor signs of intrathecal spread such as rapid motor block. Please see nursing notes for vital signs.

## 2011-03-04 NOTE — Progress Notes (Signed)
  Late decels will call Dr. Penne Lash to evaluate.

## 2011-03-04 NOTE — Anesthesia Postprocedure Evaluation (Signed)
Anesthesia Post Note  Patient: Pamela [name]  Procedure(s) Performed:  CESAREAN SECTION - primary of baby  boy at 0330  Anesthesia type: Epidural  Patient location: pacu   Post pain: Pain level controlled  Post assessment: Post-op Vital signs reviewed  Last Vitals:  Filed Vitals:   03/04/11 0615  BP: 117/85  Pulse: 101  Temp: 98.9 F (37.2 C)  Resp: 16    Post vital signs: Reviewed  Level of consciousness: awake  Complications: No apparent anesthesia complications

## 2011-03-04 NOTE — Op Note (Signed)
Pamela [name] PROCEDURE DATE: 03/04/2011  PREOPERATIVE DIAGNOSIS: Intrauterine pregnancy at  [redacted]w[redacted]d weeks gestation with non reassuring fetal heart tracing      POSTOPERATIVE DIAGNOSIS: The same  PROCEDURE:    Low Transverse Cesarean Section  SURGEON:  Dr. Elsie Lincoln  ASSISTANT:  Zerita Boers, CNM  INDICATIONS: Pamela [name] is a 25 y.o. G2P1011 at [redacted]w[redacted]d with non reassuring FHT.  The risks of cesarean section discussed with the patient included but were not limited to: bleeding which may require transfusion or reoperation; infection which may require antibiotics; injury to bowel, bladder, ureters or other surrounding organs; injury to the fetus; need for additional procedures including hysterectomy in the event of a life-threatening hemorrhage; placental abnormalities wth subsequent pregnancies, incisional problems, thromboembolic phenomenon and other postoperative/anesthesia complications. The patient concurred with the proposed plan, giving informed written consent for the procedure.    FINDINGS:  Viable female infant in cephalic presentation, APGAR 8, 9:  6 lb 12 oz. Meconium stained amniotic fluid.  Intact placenta, three vessel cord.  Grossly normal uterus, ovaries and fallopian tubes. .   ANESTHESIA:    Epidural ESTIMATED BLOOD LOSS: 800 ml SPECIMENS: Placenta sent to placenta   COMPLICATIONS: None immediate  PROCEDURE IN DETAIL:  The patient received intravenous antibiotics and had sequential compression devices applied to her lower extremities while in the preoperative area.  She was then taken to the operating room where epidural anesthesia was dosed up to surgical level and was found to be adequate. She was then placed in a dorsal supine position with a leftward tilt, and prepped and draped in a sterile manner.  A foley catheter was in her bladder and attached to constant gravity.  After an adequate timeout was performed, a Pfannenstiel skin incision was made with scalpel and  carried through to the underlying layer of fascia. The fascia was incised in the midline and this incision was extended bilaterally using the Mayo scissors. Kocher clamps were applied to the superior aspect of the fascial incision and the underlying rectus muscles were dissected off bluntly. A similar process was carried out on the inferior aspect of the facial incision. The rectus muscles were separated in the midline bluntly and the peritoneum was entered bluntly.  The peritoneal incision was extended with good visualization of the bladder.  A transverse hysterotomy was made with a scalpel and extended bilaterally bluntly. The bladder blade was then removed. The infant was successfully delivered, and cord was clamped and cut and infant was handed over to awaiting neonatology team. Uterine massage was then administered and the placenta delivered intact with three-vessel cord. The uterus was cleared of clot and debris.  The hysterotomy was closed with 0 vicryl.  A second imbricating suture of 0-Vicryl was used to reinforce the incision and aid in hemostasis.  The peritoneum and rectus muscles were noted to be hemostatic.  The fascia was closed with 0-Vicryl in a running fashion with good restoration of anatomy.  The subcutaneus tissue was copiously irrigated and re approximated with 0 plain suture.The skin was closed with staples.  Pt tolerated the procedure will.  All counts were correct x2.  Pt went to the recovery room in stable condition.

## 2011-03-04 NOTE — Progress Notes (Signed)
Encounter addended by: Suella Grove on: 03/04/2011  8:04 AM<BR>     Documentation filed: Notes Section

## 2011-03-04 NOTE — Anesthesia Postprocedure Evaluation (Signed)
Anesthesia Post Note  Patient: Pamela [name]  Procedure(s) Performed:  CESAREAN SECTION - primary of baby  boy at 0330  Anesthesia type: Epidural  Patient location: pacu   Post pain: Pain level controlled  Post assessment: Post-op Vital signs reviewed  Last Vitals:  Filed Vitals:   03/04/11 0700  BP: 121/76  Pulse: 92  Temp: 98.3 F (36.8 C)  Resp: 18    Post vital signs: Reviewed  Level of consciousness: awake  Complications: No apparent anesthesia complications

## 2011-03-04 NOTE — Anesthesia Postprocedure Evaluation (Signed)
  Anesthesia Post-op Note  Patient: Pamela [name]  Procedure(s) Performed:  CESAREAN SECTION - primary of baby  boy at 51  Patient Location: PACU and Mother/Baby  Anesthesia Type: Epidural  Level of Consciousness: awake, alert , oriented and patient cooperative  Airway and Oxygen Therapy: Patient Spontanous Breathing  Post-op Pain: none  Post-op Assessment: Post-op Vital signs reviewed, Patient's Cardiovascular Status Stable, Respiratory Function Stable, Patent Airway, NAUSEA AND VOMITING PRESENT, Adequate PO intake and Pain level controlled  Post-op Vital Signs: Reviewed and stable  Complications: No apparent anesthesia complications

## 2011-03-04 NOTE — OR Nursing (Signed)
Fundal Massage DLWegner RN, cord ph [phone]

## 2011-03-04 NOTE — Transfer of Care (Signed)
Immediate Anesthesia Transfer of Care Note  Patient: Pamela [name]  Procedure(s) Performed:  CESAREAN SECTION - primary of baby  boy at 66  Patient Location: PACU  Anesthesia Type: Epidural  Level of Consciousness: awake, alert  and oriented  Airway & Oxygen Therapy: Patient Spontanous Breathing  Post-op Assessment: Report given to PACU RN and Post -op Vital signs reviewed and stable  Post vital signs: stable  Complications: No apparent anesthesia complications

## 2011-03-04 NOTE — Progress Notes (Signed)
Pamela [name] is a 25 y.o. G2P1011 at [redacted]w[redacted]d by with non reassuring fetal heart tracing (This is a late entry.  Entered after urgent c/s complete)  Subjective: Pt has no complaints  Objective: BP 119/41  Pulse 94  Temp(Src) 97.9 F (36.6 C) (Oral)  Resp 18  Ht 5\' 10"  (1.778 m)  Wt 146.965 kg (324 lb)  BMI 46.49 kg/m2  SpO2 99%  Breastfeeding? Unknown   Total I/O In: 1300 [I.V.:1300] Out: 650 [Urine:50; Blood:600]  FHT:  Variability moderate, late decelerations present after each contractions for approx 30 mins.  O2, postion changes and fluid bolus di d not relieve late decelerations.   UC:   regular, every 3-4 minutes SVE:   Dilation: 4.5 Effacement (%): 90 Station: -1 Exam by:: l.poore, rn  Labs: Lab Results  Component Value Date   WBC 11.8* 03/03/2011   HGB 12.3 03/03/2011   HCT 36.9 03/03/2011   MCV 85.0 03/03/2011   PLT 271 03/03/2011    Assessment / Plan: Spontaneous labor, progressing normally  Labor: 4-5 cm, transverse presentation Preeclampsia:  no signs or symptoms of toxicity Fetal Wellbeing:  Category III Pain Control:  Epidural I/D:  n/a Anticipated MOD:  Pt was counseled for c/s due to nonreassuring FHT.  Rozelle Caudle H. 03/04/2011, 4:08 AM

## 2011-03-04 NOTE — Progress Notes (Signed)
Pamela [name] is a 25 y.o. G2P0010 at [redacted]w[redacted]d by ultrasound admitted for earley labor  Subjective:   Objective: BP 129/45  Pulse 93  Temp(Src) 97.9 F (36.6 C) (Oral)  Resp 18  Ht 5\' 10"  (1.778 m)  Wt 146.965 kg (324 lb)  BMI 46.49 kg/m2      FHT:  FHR baseline 145-150, mod variability, accels noted. Variable decels noted. UC:   regular, every 3-4 minutes SVE:   Dilation: 3 Effacement (%): 90 Station: -1 Exam by:: l.poore, rn  Labs: Lab Results  Component Value Date   WBC 11.8* 03/03/2011   HGB 12.3 03/03/2011   HCT 36.9 03/03/2011   MCV 85.0 03/03/2011   PLT 271 03/03/2011    Assessment / Plan: Spontaneous labor, progressing normally  Labor: Progressing normally Preeclampsia:  no signs or symptoms of toxicity Fetal Wellbeing:  Category II Pain Control:  Labor support without medications I/D:  n/a Anticipated MOD:  NSVD  Zerita Boers 03/04/2011, 12:29 AM

## 2011-03-05 ENCOUNTER — Encounter (HOSPITAL_COMMUNITY): Payer: Self-pay | Admitting: Obstetrics & Gynecology

## 2011-03-05 LAB — URINALYSIS, ROUTINE W REFLEX MICROSCOPIC
Bilirubin Urine: NEGATIVE
Glucose, UA: NEGATIVE
Ketones, ur: NEGATIVE
Leukocytes, UA: NEGATIVE
Nitrite: NEGATIVE
pH: 5.5

## 2011-03-05 LAB — CBC
MCV: 87.9 fL (ref 78.0–100.0)
Platelets: 246 10*3/uL (ref 150–400)
RBC: 3.72 MIL/uL — ABNORMAL LOW (ref 3.87–5.11)
WBC: 14.2 10*3/uL — ABNORMAL HIGH (ref 4.0–10.5)

## 2011-03-05 LAB — PREGNANCY, URINE: Preg Test, Ur: POSITIVE

## 2011-03-05 LAB — HEMOGLOBIN AND HEMATOCRIT, BLOOD
HCT: 35.2 — ABNORMAL LOW
Hemoglobin: 11.7 — ABNORMAL LOW

## 2011-03-05 LAB — URINE MICROSCOPIC-ADD ON

## 2011-03-05 MED ORDER — SENNOSIDES-DOCUSATE SODIUM 8.6-50 MG PO TABS
2.0000 | ORAL_TABLET | Freq: Every day | ORAL | Status: DC
  Administered 2011-03-05 – 2011-03-06 (×2): 2 via ORAL

## 2011-03-05 NOTE — Progress Notes (Addendum)
Subjective: Postpartum Day 1: Cesarean Delivery Patient reports nausea.    Objective: Vital signs in last 24 hours: Temp:  [98.3 F (36.8 C)-99.2 F (37.3 C)] 98.5 F (36.9 C) (10/02 0400) Pulse Rate:  [84-105] 96  (10/02 0400) Resp:  [18-20] 18  (10/02 0400) BP: (102-141)/(64-76) 119/76 mmHg (10/02 0400) SpO2:  [96 %-99 %] 98 % (10/02 0400)  Physical Exam:  General: alert, cooperative and no distress Lung: CTAB Hrt: RRR, No murmur or bruits Lochia: appropriate Uterine Fundus: firm Incision: healing well, no significant drainage, staples DVT Evaluation: No evidence of DVT seen on physical exam. 1+ edema in lower ext   [facility] 03/05/11 0550 03/03/11 1951  HGB 10.6* 12.3  HCT 32.7* 36.9    Assessment/Plan: Status post Cesarean section. Doing well postoperatively.  Continue current care Brst/Bottlefeeding Mirena at 6 week visit.  Zacchary Pompei E. 03/05/2011, 7:25 AM

## 2011-03-05 NOTE — Progress Notes (Signed)
PSYCHOSOCIAL ASSESSMENT ~ MATERNAL/CHILD  Name: Pamela [name] Age: 25  Referral Date: 10 / 02 / 12  Reason/Source: History of MJ use / CN  I. FAMILY/HOME ENVIRONMENT  A. Child's Legal Guardian _X__Parent(s) ___Grandparent ___Foster parent ___DSS_________________  Name: Pamela [name] DOB: // Age: 24  Address: [address] [place], [place] [address]  Name: Pamela [name] DOB: // Age: 24  Address:  B. Other Household Members/Support Persons Name: Pamela [name] Relationship: parents DOB ___/___/___  Name: Relationship: sister ; 17yr DOB ___/___/___  Name: Relationship: DOB ___/___/___  Name: Relationship: DOB ___/___/___  C. Other Support:  II. PSYCHOSOCIAL DATA A. Information Source _X_Patient Interview __Family Interview __Other___________ B. Financial and Community Resources __Employment:  _X_Medicaid County: [place] __Private Insurance: __Self Pay  __Food Stamps _X_WIC __Work First __Public Housing __Section 8  _X_Maternity Care Coordination/Child Service Coordination/Early Intervention : ?  ___School: Grade:  __Other:  C. Cultural and Environment Information Cultural Issues Impacting Care:  III. STRENGTHS _X__Supportive family/friends  _X__Adequate Resources  ___Compliance with medical plan  _X__Home prepared for Child (including basic supplies)  ___Understanding of illness  ___Other:  RISK FACTORS AND CURRENT PROBLEMS ____No Problems Noted  History of MJ use  IV. SOCIAL WORK ASSESSMENT Pt admits to smoking MJ, daily prior to pregnancy confirmation at 2 months. Pt states that she "tried to quit" but couldn't as a result of being scared (about becoming a mom) and "stressed out." At present, pt states she is no longer "scared" and loves her child. While pt decreased MJ use, she continued to smoke, estimating a total of 8 times during the pregnancy. The last time the pt smoked MJ was at the end of August. Sw informed pt of positive UDS results and advised her that a CPS report  would be made. Pt appears understanding as she stated " there are consequences to every action." Pt lives with her parents, who they are not aware of MJ use. Pt is hopeful that CPS staff will not disclose the reason for there involvement with her parents, as she is "grown." FOB will not be involved, as per the pt. She has supplies for the infant and adequate family support. Pt appears to be bonding well with the infant and appropriate. Sw will report positive drug screen result and continue to follow until discharge.  V. SOCIAL WORK PLAN ___No Further Intervention Required/No Barriers to Discharge  ___Psychosocial Support and Ongoing Assessment of Needs  ___Patient/Family Education:  _X__Child Protective Services Report County: [place] Date: 03/05/11  ___Information/Referral to Community Resources_________________________  ___Other:     DOB ___/___/___  C. Other Support:   II. PSYCHOSOCIAL DATA A. Information Source                                                                                             _X_Patient Interview  __Family Interview           __Other___________  B. Surveyor, [employer] and [employer] __Employment: _X_Medicaid    [place]: [employer]                __Private Insurance:                   __Self Pay  __Food Stamps   _X_WIC __Work First     __Public Housing     __Section 8    _X_Maternity Care Coordination/Child Service Coordination/Early Intervention : ?  ___School:                                                                         Grade:  __Other:   Pamela [name] Cultural and Environment Information Cultural Issues Impacting Care:  III. STRENGTHS _X__Supportive  family/friends _X__Adequate Resources ___Compliance with medical plan _X__Home prepared for Child (including basic supplies) ___Understanding of illness      ___Other: RISK FACTORS AND CURRENT PROBLEMS         ____No Problems Noted       History of MJ use                                                                                                                                                                                                                                            IV. SOCIAL WORK ASSESSMENT  Pt admits to smoking MJ, daily prior to pregnancy confirmation at 2 months.  Pt  states that she "tried to quit" but couldn't as a result of being scared (about becoming a mom) and "stressed out."  At present, pt states she is no longer "scared" and loves her child.  While pt decreased MJ use, she continued to smoke, estimating a total of 8 times during the pregnancy.  The last time the pt smoked MJ was at the end of August.  Sw informed pt of positive UDS results and advised her that a CPS report would be made.  Pt appears understanding as she stated " there are consequences to every action."  Pt lives with her parents, who they are not aware of MJ use.  Pt is hopeful that CPS staff will not disclose the reason for there involvement with her parents, as she is "grown."   FOB will not be involved, as per the pt.  She has supplies for the infant and adequate family support.  Pt appears to be bonding well with the infant and appropriate.  Sw will report positive drug screen result and continue to follow until discharge.     V. SOCIAL WORK PLAN  ___No Further Intervention Required/No Barriers to Discharge   ___Psychosocial Support and Ongoing Assessment of Needs   ___Patient/Family Education:   _X__Child Protective Services Report County: [place] Date: 03/05/11   ___Information/Referral to [employer]_________________________   ___Other:

## 2011-03-06 NOTE — Progress Notes (Signed)
Post Partum Day 2 Subjective: no complaints, up ad lib, voiding, tolerating PO and no flatus or BM yet. Mild pain at incision site, well-controlled with PO pain medication. Patient states she has had mild nausea with one episode of vomiting yesterday.  Objective: Blood pressure 133/74, pulse 109, temperature 98 F (36.7 C), temperature source Oral, resp. rate 20, height 5\' 10"  (1.778 m), weight 146.965 kg (324 lb), SpO2 98.00%, unknown if currently breastfeeding.  Physical Exam:  General: alert, cooperative and no distress Lochia: appropriate Uterine Fundus: firm Incision: healing well; staples intact; no edema, erythema or discharge from the wound.  DVT Evaluation: No evidence of DVT seen on physical exam. No cords or calf tenderness.   Basename 03/05/11 0550 03/03/11 1951  HGB 10.6* 12.3  HCT 32.7* 36.9    Assessment/Plan: Plan for discharge tomorrow and Contraception ; plans for IUD insertion at [facility] following 6 week PP visit Patient was seen by the lactation consultant yesterday. She felt this was helpful, although she is still having difficulty with latching. Patient started on colace yesterday.  Patient given advice regarding the dangers of co-sleeping with her infant and was discouraged from doing so. She acknowledged and agreed with this plan.    LOS: 3 days   Judith Blonder 03/06/2011, 7:28 AM

## 2011-03-06 NOTE — H&P (Signed)
Agree with above note.  Pamela [name] H. 03/06/2011 5:26 AM

## 2011-03-07 MED ORDER — IBUPROFEN 600 MG PO TABS: 600.0000 mg | ORAL_TABLET | Freq: Four times a day (QID) | ORAL | Status: AC

## 2011-03-07 MED ORDER — OXYCODONE-ACETAMINOPHEN 5-325 MG PO TABS: 1.0000 | ORAL_TABLET | ORAL | Status: AC | PRN

## 2011-03-07 MED ORDER — DOCUSATE SODIUM 50 MG PO CAPS: 100.0000 mg | ORAL_CAPSULE | Freq: Two times a day (BID) | ORAL | Status: AC | PRN

## 2011-03-07 NOTE — Progress Notes (Signed)
Post Partum Day 3 Subjective: no complaints, up ad lib, voiding, tolerating PO, + flatus and +BM Patient continues to have moderate pain at incision site. Well-controlled by PO pain medications.  Objective: Blood pressure 132/71, pulse 111, temperature 98.6 F (37 C), temperature source Oral, resp. rate 18, height 5\' 10"  (1.778 m), weight 146.965 kg (324 lb), SpO2 98.00%, unknown if currently breastfeeding.  Physical Exam:  General: alert, cooperative and no distress Lochia: appropriate Uterine Fundus: firm Incision: healing well, no significant drainage, no dehiscence, no significant erythema DVT Evaluation: No evidence of DVT seen on physical exam. No cords or calf tenderness.   Basename 03/05/11 0550  HGB 10.6*  HCT 32.7*    Assessment/Plan: Discharge home, Breastfeeding and Contraception ; plans for IUD after 6 week PP visit Patient has scheduled [facility]; Needs [facility] on Monday 10/8 for staple removal    LOS: 4 days   Judith Blonder 03/07/2011, 7:43 AM

## 2011-03-07 NOTE — Discharge Summary (Signed)
Obstetric Discharge Summary Reason for Admission: onset of labor Prenatal Procedures: ultrasound Intrapartum Procedures: cesarean: low cervical, transverse for NRFHR Postpartum Procedures: none Complications-Operative and Postpartum: none Hemoglobin  Date Value Range Status  03/05/2011 10.6* 12.0-15.0 (g/dL) Final     HCT  Date Value Range Status  03/05/2011 32.7* 36.0-46.0 (%) Final   Hospital Course: [name] was admitted on 9/30 at 39.4 for early labor. Several hours into the process the fetus started having late decelerations. Therefore, she take emergently to the OR by Dr. Penne Lash. A successful LTCS was performed, and the delivery was attended by the neonatology team. The patient had no complications after surgery. She was concerned about her ability to breast feed, so she had a lactation consultation. On POD#3 the patient was appropriate for discharge.   Discharge Diagnoses: Term Pregnancy-delivered  Discharge Information: Date: 03/07/2011 Activity: pelvic rest Diet: routine Medications: PNV, Ibuprofen and Colace, Percocet 5-325 mg, #60 Condition: stable Instructions: refer to practice specific booklet Discharge to: home Follow-up Information    Follow up with [facility] in 6 weeks. (as scheduled)    Contact information:   [address] [place] [address] [phone]       Follow-up to [facility] on 10/8 for staple removal   Newborn Data: Live born female  Birth Weight: 6 lb 12.1 oz (3065 g) APGAR: 8, 9  Home with mother.  Pamela [name] 03/07/2011, 7:57 AM  [name]. [name] M.D. 03/07/11 @ 3:52 PM

## 2011-03-08 LAB — CBC
MCHC: 33 g/dL (ref 30.0–36.0)
MCV: 87.1 fL (ref 78.0–100.0)
Platelets: 426 10*3/uL — ABNORMAL HIGH (ref 150–400)
RBC: 3.3 MIL/uL — ABNORMAL LOW (ref 3.87–5.11)

## 2011-03-08 LAB — HCG, QUANTITATIVE, PREGNANCY: hCG, Beta Chain, Quant, S: 1393 m[IU]/mL — ABNORMAL HIGH (ref <5)

## 2011-03-08 LAB — DIFFERENTIAL
Basophils Absolute: 0 10*3/uL (ref 0.0–0.1)
Basophils Relative: 0 % (ref 0–1)
Eosinophils Absolute: 0.2 10*3/uL (ref 0.0–0.7)
Neutro Abs: 7.3 10*3/uL (ref 1.7–7.7)
Neutrophils Relative %: 64 % (ref 43–77)

## 2011-03-08 LAB — PREGNANCY, URINE: Preg Test, Ur: POSITIVE

## 2011-03-13 NOTE — Discharge Summary (Signed)
Agree with above note.  Pamela [name] H. 03/13/2011 9:40 AM

## 2011-10-08 ENCOUNTER — Other Ambulatory Visit: Payer: Self-pay

## 2011-10-08 ENCOUNTER — Emergency Department (HOSPITAL_COMMUNITY): Payer: Medicaid Other

## 2011-10-08 ENCOUNTER — Emergency Department (HOSPITAL_COMMUNITY)
Admission: EM | Admit: 2011-10-08 | Discharge: 2011-10-08 | Disposition: A | Discharge status: Home / Self Care | Payer: Medicaid Other | Attending: Emergency Medicine | Admitting: Emergency Medicine

## 2011-10-08 DIAGNOSIS — F411 Generalized anxiety disorder: Secondary | ICD-10-CM | POA: Insufficient documentation

## 2011-10-08 DIAGNOSIS — R1011 Right upper quadrant pain: Secondary | ICD-10-CM | POA: Insufficient documentation

## 2011-10-08 DIAGNOSIS — R109 Unspecified abdominal pain: Secondary | ICD-10-CM

## 2011-10-08 DIAGNOSIS — K8020 Calculus of gallbladder without cholecystitis without obstruction: Secondary | ICD-10-CM | POA: Insufficient documentation

## 2011-10-08 LAB — URINALYSIS, ROUTINE W REFLEX MICROSCOPIC
Bilirubin Urine: NEGATIVE
Glucose, UA: NEGATIVE mg/dL
Protein, ur: NEGATIVE mg/dL

## 2011-10-08 LAB — COMPREHENSIVE METABOLIC PANEL
ALT: 36 U/L — ABNORMAL HIGH (ref 0–35)
AST: 58 U/L — ABNORMAL HIGH (ref 0–37)
Albumin: 3.6 g/dL (ref 3.5–5.2)
CO2: 23 mEq/L (ref 19–32)
Chloride: 103 mEq/L (ref 96–112)
Creatinine, Ser: 0.77 mg/dL (ref 0.50–1.10)
Potassium: 3.5 mEq/L (ref 3.5–5.1)
Sodium: 138 mEq/L (ref 135–145)
Total Bilirubin: 0.2 mg/dL — ABNORMAL LOW (ref 0.3–1.2)

## 2011-10-08 LAB — DIFFERENTIAL
Basophils Absolute: 0 10*3/uL (ref 0.0–0.1)
Basophils Relative: 0 % (ref 0–1)
Lymphocytes Relative: 49 % — ABNORMAL HIGH (ref 12–46)
Monocytes Absolute: 0.6 10*3/uL (ref 0.1–1.0)
Neutro Abs: 3.9 10*3/uL (ref 1.7–7.7)
Neutrophils Relative %: 43 % (ref 43–77)

## 2011-10-08 LAB — CBC
MCHC: 33.1 g/dL (ref 30.0–36.0)
Platelets: 360 10*3/uL (ref 150–400)
RDW: 13.2 % (ref 11.5–15.5)
WBC: 9.2 10*3/uL (ref 4.0–10.5)

## 2011-10-08 LAB — PREGNANCY, URINE: Preg Test, Ur: NEGATIVE

## 2011-10-08 LAB — URINE MICROSCOPIC-ADD ON

## 2011-10-08 LAB — LIPASE, BLOOD: Lipase: 27 U/L (ref 11–59)

## 2011-10-08 MED ORDER — OXYCODONE-ACETAMINOPHEN 5-325 MG PO TABS: 2.0000 | ORAL_TABLET | ORAL | Status: DC | PRN

## 2011-10-08 MED ORDER — HYDROMORPHONE HCL PF 1 MG/ML IJ SOLN
1.0000 mg | Freq: Once | INTRAMUSCULAR | Status: AC
  Administered 2011-10-08: 1 mg via INTRAVENOUS
  Filled 2011-10-08: qty 1

## 2011-10-08 MED ORDER — SODIUM CHLORIDE 0.9 % IV SOLN
INTRAVENOUS | Status: DC
Start: 2011-10-08 — End: 2011-10-08
  Administered 2011-10-08 (×2): via INTRAVENOUS

## 2011-10-08 MED ORDER — HYDROMORPHONE HCL PF 1 MG/ML IJ SOLN
0.5000 mg | Freq: Once | INTRAMUSCULAR | Status: AC
  Administered 2011-10-08: 0.5 mg via INTRAVENOUS
  Filled 2011-10-08: qty 1

## 2011-10-08 MED ORDER — SODIUM CHLORIDE 0.9 % IV BOLUS (SEPSIS)
500.0000 mL | Freq: Once | INTRAVENOUS | Status: AC
  Administered 2011-10-08: 500 mL via INTRAVENOUS

## 2011-10-08 MED ORDER — ONDANSETRON HCL 4 MG/2ML IJ SOLN
4.0000 mg | Freq: Once | INTRAMUSCULAR | Status: AC
  Administered 2011-10-08: 4 mg via INTRAVENOUS
  Filled 2011-10-08: qty 2

## 2011-10-08 MED ORDER — OXYCODONE-ACETAMINOPHEN 5-325 MG PO TABS: 1.0000 | ORAL_TABLET | ORAL | Status: AC | PRN

## 2011-10-08 NOTE — Discharge Instructions (Signed)
Call the surgeon for a followup appointment, as soon as possible.  Biliary Colic  Biliary colic is a steady or irregular pain in the upper abdomen. It is usually under the right side of the rib cage. It happens when gallstones interfere with the normal flow of bile from the gallbladder. Bile is a liquid that helps to digest fats. Bile is made in the liver and stored in the gallbladder. When you eat a meal, bile passes from the gallbladder through the cystic duct and the common bile duct into the small intestine. There, it mixes with partially digested food. If a gallstone blocks either of these ducts, the normal flow of bile is blocked. The muscle cells in the bile duct contract forcefully to try to move the stone. This causes the pain of biliary colic.  SYMPTOMS   A person with biliary colic usually complains of pain in the upper abdomen. This pain can be:   In the center of the upper abdomen just below the breastbone.   In the upper-right part of the abdomen, near the gallbladder and liver.   Spread back toward the right shoulder blade.   Nausea and vomiting.   The pain usually occurs after eating.   Biliary colic is usually triggered by the digestive system's demand for bile. The demand for bile is high after fatty meals. Symptoms can also occur when a person who has been fasting suddenly eats a very large meal. Most episodes of biliary colic pass after 1 to 5 hours. After the most intense pain passes, your abdomen may continue to ache mildly for about 24 hours.  DIAGNOSIS  After you describe your symptoms, your caregiver will perform a physical exam. He or she will pay attention to the upper right portion of your belly (abdomen). This is the area of your liver and gallbladder. An ultrasound will help your caregiver look for gallstones. Specialized scans of the gallbladder may also be done. Blood tests may be done, especially if you have fever or if your pain persists. PREVENTION  Biliary  colic can be prevented by controlling the risk factors for gallstones. Some of these risk factors, such as heredity, increasing age, and pregnancy are a normal part of life. Obesity and a high-fat diet are risk factors you can change through a healthy lifestyle. Women going through menopause who take hormone replacement therapy (estrogen) are also more likely to develop biliary colic. TREATMENT   Pain medication may be prescribed.   You may be encouraged to eat a fat-free diet.   If the first episode of biliary colic is severe, or episodes of colic keep retuning, surgery to remove the gallbladder (cholecystectomy) is usually recommended. This procedure can be done through small incisions using an instrument called a laparoscope. The procedure often requires a brief stay in the hospital. Some people can leave the hospital the same day. It is the most widely used treatment in people troubled by painful gallstones. It is effective and safe, with no complications in more than 90% of cases.   If surgery cannot be done, medication that dissolves gallstones may be used. This medication is expensive and can take months or years to work. Only small stones will dissolve.   Rarely, medication to dissolve gallstones is combined with a procedure called shock-wave lithotripsy. This procedure uses carefully aimed shock waves to break up gallstones. In many people treated with this procedure, gallstones form again within a few years.  PROGNOSIS  If gallstones block your cystic duct  or common bile duct, you are at risk for repeated episodes of biliary colic. There is also a 25% chance that you will develop a gallbladder infection(acute cholecystitis), or some other complication of gallstones within 10 to 20 years. If you have surgery, schedule it at a time that is convenient for you and at a time when you are not sick. HOME CARE INSTRUCTIONS   Drink plenty of clear fluids.   Avoid fatty, greasy or fried foods, or  any foods that make your pain worse.   Take medications as directed.  SEEK MEDICAL CARE IF:   You develop a fever over 100.5 F (38.1 C).   Your pain gets worse over time.   You develop nausea that prevents you from eating and drinking.   You develop vomiting.  SEEK IMMEDIATE MEDICAL CARE IF:   You have continuous or severe belly (abdominal) pain which is not relieved with medications.   You develop nausea and vomiting which is not relieved with medications.   You have symptoms of biliary colic and you suddenly develop a fever and shaking chills. This may signal cholecystitis. Call your caregiver immediately.   You develop a yellow color to your skin or the white part of your eyes (jaundice).  Document Released: 10/21/2005 Document Revised: 05/09/2011 Document Reviewed: 12/31/2007 [facility] Patient Information 2012 [place], [place].Gallstones Gallstones are a form of gallbladder disease. The gallbladder is a small organ that helps you digest food.  HOME CARE  Only take medicine as told by your doctor.   Eat a low-fat diet.   Follow up as told.  GET HELP RIGHT AWAY IF:   Your pain gets worse.   You develop yellow skin or eyes (jaundice).   The pain moves to another part of your belly (abdomen) or back.   You have a temperature by mouth above 102 F (38.9 C), not controlled by medicine.   You feel sick to your stomach (nauseous) and throw up (vomit).  MAKE SURE YOU:   Understand these instructions.   Will watch your condition.   Will get help right away if you are not doing well or get worse.  Document Released: 11/06/2007 Document Revised: 05/09/2011 Document Reviewed: 04/25/2009 [facility] Patient Information 2012 [place], [place].

## 2011-10-08 NOTE — ED Provider Notes (Signed)
History     CSN: [id number]  Arrival date & time 10/08/11  4098   First MD Initiated Contact with Patient 10/08/11 [phone]      Chief Complaint  Patient presents with  . Abdominal Pain    (Consider location/radiation/quality/duration/timing/severity/associated sxs/prior treatment) HPI Comments: Pamela [name] is a 26 y.o. Female who was awakened by upper abdominal pain. 2 hours ago. It has persisted. She's never had this happen before. She denies nausea, vomiting, diarrhea, fever, chills, back pain, cough, or shortness of breath. She did not take any medicine for it.  The history is provided by the patient and a relative.    Past Medical History  Diagnosis Date  . Genital herpes   . Anxiety     Past Surgical History  Procedure Date  . Tonsillectomy   . Cesarean section 03/04/2011    Procedure: CESAREAN SECTION;  Surgeon: Lesly Dukes, MD;  Location: [facility];  Service: Gynecology;  Laterality: N/A;  primary of baby  boy at 20    No family history on file.  History  Substance Use Topics  . Smoking status: Former Games developer  . Smokeless tobacco: Never Used  . Alcohol Use: No    OB History    Grav Para Term Preterm Abortions TAB SAB Ect Mult Living   2 1 1  0 1 0 1 0 0 1      Review of Systems  All other systems reviewed and are negative.    Allergies  Ceftriaxone  Home Medications   Current Outpatient Rx  Name Route Sig Dispense Refill  . PRENATAL PLUS 27-1 MG PO TABS Oral Take 1 tablet by mouth daily.      Marland Kitchen VALACYCLOVIR HCL 500 MG PO TABS Oral Take 500 mg by mouth 2 (two) times daily.      . OXYCODONE-ACETAMINOPHEN 5-325 MG PO TABS Oral Take 1 tablet by mouth every 4 (four) hours as needed for pain. 30 tablet 0    BP 118/90  Pulse 61  Temp(Src) 98.3 F (36.8 C) (Oral)  Resp 18  Ht 5\' 10"  (1.778 m)  SpO2 100%  LMP 09/13/2011  Breastfeeding? Unknown  Physical Exam  Nursing note and vitals reviewed. Constitutional: She is oriented to person, place,  and time. She appears well-developed and well-nourished.  HENT:  Head: Normocephalic and atraumatic.  Eyes: Conjunctivae and EOM are normal. Pupils are equal, round, and reactive to light.  Neck: Normal range of motion and phonation normal. Neck supple.  Cardiovascular: Normal rate, regular rhythm and intact distal pulses.   Pulmonary/Chest: Effort normal and breath sounds normal. She exhibits no tenderness.  Abdominal: Soft. She exhibits no distension and no mass. There is tenderness (Moderate right upper quadrant). There is no rebound and no guarding.  Musculoskeletal: Normal range of motion.  Neurological: She is alert and oriented to person, place, and time. She has normal strength. She exhibits normal muscle tone.  Skin: Skin is warm and dry.  Psychiatric: Her behavior is normal.       Anxious.     ED Course  Procedures (including critical care time) Emergency department treatment: IV fluids. IV Dilaudid, IV Zofran      Date: 10/08/2011  Rate: 79  Rhythm: normal sinus rhythm  QRS Axis: normal  Intervals: normal  ST/T Wave abnormalities: normal  Conduction Disutrbances:none  Narrative Interpretation:   Old EKG Reviewed: none available    Labs Reviewed  DIFFERENTIAL - Abnormal; Notable for the following:    Lymphocytes Relative  49 (*)    Lymphs Abs 4.5 (*)    All other components within normal limits  COMPREHENSIVE METABOLIC PANEL - Abnormal; Notable for the following:    Glucose, Bld 108 (*)    AST 58 (*)    ALT 36 (*)    Total Bilirubin 0.2 (*)    All other components within normal limits  URINALYSIS, ROUTINE W REFLEX MICROSCOPIC - Abnormal; Notable for the following:    APPearance CLOUDY (*)    Hgb urine dipstick MODERATE (*)    All other components within normal limits  URINE MICROSCOPIC-ADD ON - Abnormal; Notable for the following:    Bacteria, UA MANY (*)    All other components within normal limits  CBC  PREGNANCY, URINE  LIPASE, BLOOD   Dg Chest 2  View  10/08/2011  *RADIOLOGY REPORT*  Clinical Data: Upper abdominal pain  CHEST - 2 VIEW  Comparison: None.  Findings: Normal heart size.  Clear lungs.  No pneumothorax.  IMPRESSION: No active cardiopulmonary disease.  Original Report Authenticated By: Donavan Burnet, M.D.   US Abdomen Complete  10/08/2011  *RADIOLOGY REPORT*  Clinical Data:  Abdominal pain  COMPLETE ABDOMINAL ULTRASOUND  Comparison:  None.  Findings:  Gallbladder:  Tiny gallstones are noted within gallbladder.  No thickening of the gallbladder wall.  There is positive sonographic Murphy's sign.  Clinical correlation is necessary to exclude early cholecystitis.  Common bile duct:  Measures 7 mm in diameter mild prominent in size.  Liver:    Mild coarse echogenicity without focal mass or intrahepatic biliary ductal dilatation.  IVC:  Appears normal.  Pancreas:  Limited visualization due to abundant bowel gas.  Spleen:  Measures 8.5 cm in length.  Normal echogenicity.  Small accessory splenule measures 1.7 x 1.6 cm.  Right Kidney:  Measures 12.2 cm in length. No hydronephrosis or diagnostic renal calculus  Left Kidney:  Measures 11.5 cm in length.  No hydronephrosis or diagnostic renal calculus  Abdominal aorta:  No aneurysm identified. Measures up to 1.9 cm in diameter.  IMPRESSION: 1.  Tiny gallstones are noted within gallbladder.  No thickening of the gallbladder wall.  There is positive sonographic Murphy's sign. Clinical correlation is necessary to exclude early cholecystitis. 2.  Mild prominent size CBD measures 7 mm in diameter. 3.  No hydronephrosis or diagnostic renal calculus.  Original Report Authenticated By: Natasha Mead, M.D.   Mr Mrcp  10/08/2011  *RADIOLOGY REPORT*  Clinical Data:  Abdominal pain with biliary dilatation on ultrasound.  Cholelithiasis with positive sonographic Murphy's sign.  MRI ABDOMEN WITHOUT CONTRAST (INCLUDING MRCP)  Technique:  Multiplanar multisequence MR imaging of the abdomen was performed without  administration of intravenous contrast. Heavily T2-weighted images of the biliary and pancreatic ducts were obtained, and three-dimensional MRCP images were rendered by post processing.  Comparison:  Ultrasound same date.  Findings:  Tiny gallstones were better seen on ultrasound.  The gallbladder is distended without wall thickening or surrounding inflammatory change.  There is no intrahepatic biliary dilatation. The common hepatic duct measures 9 mm in diameter.  The common bile duct measures 7 mm in diameter and tapers distally.  There is no evidence of choledocholithiasis.  There is no evidence of pancreatic mass or pancreatic ductal dilatation.  There are no peripancreatic inflammatory changes.  The liver, spleen, adrenal glands and kidneys appear normal.  There is no lymphadenopathy or ascites.  IMPRESSION:  1.  Mild extrahepatic biliary dilatation without evidence of choledocholithiasis.  The duct tapers  distally and may reflect a normal variant. 2.  Cholelithiasis without gallbladder wall thickening.  Original Report Authenticated By: Gerrianne Scale, M.D.   Mr 3d Recon At Scanner  10/08/2011  *RADIOLOGY REPORT*  Clinical Data:  Abdominal pain with biliary dilatation on ultrasound.  Cholelithiasis with positive sonographic Murphy's sign.  MRI ABDOMEN WITHOUT CONTRAST (INCLUDING MRCP)  Technique:  Multiplanar multisequence MR imaging of the abdomen was performed without administration of intravenous contrast. Heavily T2-weighted images of the biliary and pancreatic ducts were obtained, and three-dimensional MRCP images were rendered by post processing.  Comparison:  Ultrasound same date.  Findings:  Tiny gallstones were better seen on ultrasound.  The gallbladder is distended without wall thickening or surrounding inflammatory change.  There is no intrahepatic biliary dilatation. The common hepatic duct measures 9 mm in diameter.  The common bile duct measures 7 mm in diameter and tapers distally.  There  is no evidence of choledocholithiasis.  There is no evidence of pancreatic mass or pancreatic ductal dilatation.  There are no peripancreatic inflammatory changes.  The liver, spleen, adrenal glands and kidneys appear normal.  There is no lymphadenopathy or ascites.  IMPRESSION:  1.  Mild extrahepatic biliary dilatation without evidence of choledocholithiasis.  The duct tapers distally and may reflect a normal variant. 2.  Cholelithiasis without gallbladder wall thickening.  Original Report Authenticated By: Gerrianne Scale, M.D.     1. Abdominal pain   2. Cholelithiasis       MDM  Uncomplicated cholelithiasis. Pt's pain improved in ED. Doubt CBD stone. Suspect passed small stone accounting for transient severe pain. Pt is stable for d/c. Doubt sepsis, metabolic instability or need for acute surgical intervention.      Plan: Home Medications- Percocet; Home Treatments- low fat diet ; Recommend F/u with Gen Surg in 1-2 weeks       Flint Melter, MD 10/08/11 1924

## 2011-10-08 NOTE — ED Notes (Signed)
Off floor for testing 

## 2011-10-08 NOTE — ED Notes (Signed)
RUE:[id number]<JW> Expected date:<BR> Expected time:<BR> Means of arrival:<BR> Comments:<BR> Female/abd and chest wall pain

## 2011-10-08 NOTE — ED Notes (Signed)
AS per EMS pt c/o chest wall pain, no N/V, dizziness or SOB, pt was ambulatory when EMS arrived. Epigastric tender

## 2011-10-08 NOTE — ED Notes (Signed)
Report received-airway intact-no s/s's of distress-will continue to monitor 

## 2011-10-08 NOTE — ED Notes (Signed)
Transported back from MRI

## 2011-10-24 ENCOUNTER — Telehealth (INDEPENDENT_AMBULATORY_CARE_PROVIDER_SITE_OTHER): Payer: Self-pay

## 2011-10-24 NOTE — Telephone Encounter (Signed)
Called home and cell no answer. I need for pt to be notified that we r/s her appt from 6-6 to 6-17.

## 2011-10-30 ENCOUNTER — Ambulatory Visit (INDEPENDENT_AMBULATORY_CARE_PROVIDER_SITE_OTHER): Payer: Medicaid Other | Admitting: Surgery

## 2011-10-31 ENCOUNTER — Ambulatory Visit (INDEPENDENT_AMBULATORY_CARE_PROVIDER_SITE_OTHER): Payer: Medicaid Other | Admitting: General Surgery

## 2011-11-07 ENCOUNTER — Ambulatory Visit (INDEPENDENT_AMBULATORY_CARE_PROVIDER_SITE_OTHER): Payer: Medicaid Other | Admitting: Surgery

## 2011-11-18 ENCOUNTER — Ambulatory Visit (INDEPENDENT_AMBULATORY_CARE_PROVIDER_SITE_OTHER): Payer: Medicaid Other | Admitting: Surgery

## 2012-01-09 ENCOUNTER — Inpatient Hospital Stay (HOSPITAL_COMMUNITY)
Admission: EM | Admit: 2012-01-09 | Discharge: 2012-01-11 | DRG: 418 | Disposition: A | Discharge status: Home / Self Care | Payer: Medicaid Other | Attending: Internal Medicine | Admitting: Internal Medicine

## 2012-01-09 ENCOUNTER — Encounter (HOSPITAL_COMMUNITY): Payer: Self-pay | Admitting: Emergency Medicine

## 2012-01-09 DIAGNOSIS — A6000 Herpesviral infection of urogenital system, unspecified: Secondary | ICD-10-CM | POA: Diagnosis present

## 2012-01-09 DIAGNOSIS — I498 Other specified cardiac arrhythmias: Secondary | ICD-10-CM | POA: Diagnosis present

## 2012-01-09 DIAGNOSIS — Z9089 Acquired absence of other organs: Secondary | ICD-10-CM

## 2012-01-09 DIAGNOSIS — F1210 Cannabis abuse, uncomplicated: Secondary | ICD-10-CM | POA: Diagnosis present

## 2012-01-09 DIAGNOSIS — R109 Unspecified abdominal pain: Secondary | ICD-10-CM

## 2012-01-09 DIAGNOSIS — F411 Generalized anxiety disorder: Secondary | ICD-10-CM | POA: Diagnosis present

## 2012-01-09 DIAGNOSIS — Z91040 Latex allergy status: Secondary | ICD-10-CM

## 2012-01-09 DIAGNOSIS — K859 Acute pancreatitis without necrosis or infection, unspecified: Principal | ICD-10-CM | POA: Diagnosis present

## 2012-01-09 DIAGNOSIS — F17200 Nicotine dependence, unspecified, uncomplicated: Secondary | ICD-10-CM | POA: Diagnosis present

## 2012-01-09 DIAGNOSIS — K8050 Calculus of bile duct without cholangitis or cholecystitis without obstruction: Secondary | ICD-10-CM | POA: Diagnosis present

## 2012-01-09 DIAGNOSIS — Z6841 Body Mass Index (BMI) 40.0 and over, adult: Secondary | ICD-10-CM

## 2012-01-09 DIAGNOSIS — R001 Bradycardia, unspecified: Secondary | ICD-10-CM

## 2012-01-09 DIAGNOSIS — K8020 Calculus of gallbladder without cholecystitis without obstruction: Secondary | ICD-10-CM | POA: Diagnosis present

## 2012-01-09 DIAGNOSIS — Z881 Allergy status to other antibiotic agents status: Secondary | ICD-10-CM

## 2012-01-09 DIAGNOSIS — Z720 Tobacco use: Secondary | ICD-10-CM

## 2012-01-09 HISTORY — DX: Tobacco use: Z72.0

## 2012-01-09 LAB — CBC WITH DIFFERENTIAL/PLATELET
Basophils Absolute: 0 10*3/uL (ref 0.0–0.1)
Basophils Relative: 0 % (ref 0–1)
Eosinophils Absolute: 0.1 10*3/uL (ref 0.0–0.7)
Eosinophils Relative: 1 % (ref 0–5)
MCH: 28.2 pg (ref 26.0–34.0)
MCV: 85.4 fL (ref 78.0–100.0)
Platelets: 384 10*3/uL (ref 150–400)
RDW: 13.7 % (ref 11.5–15.5)

## 2012-01-09 LAB — COMPREHENSIVE METABOLIC PANEL
ALT: 15 U/L (ref 0–35)
AST: 15 U/L (ref 0–37)
Albumin: 3.5 g/dL (ref 3.5–5.2)
Alkaline Phosphatase: 59 U/L (ref 39–117)
CO2: 27 mEq/L (ref 19–32)
Chloride: 104 mEq/L (ref 96–112)
Creatinine, Ser: 0.61 mg/dL (ref 0.50–1.10)
Potassium: 4.1 mEq/L (ref 3.5–5.1)
Sodium: 138 mEq/L (ref 135–145)
Total Bilirubin: 0.2 mg/dL — ABNORMAL LOW (ref 0.3–1.2)

## 2012-01-09 MED ORDER — HYDROMORPHONE HCL PF 1 MG/ML IJ SOLN
1.0000 mg | INTRAMUSCULAR | Status: DC | PRN
  Administered 2012-01-09: 1 mg via INTRAVENOUS
  Filled 2012-01-09: qty 1

## 2012-01-09 MED ORDER — ONDANSETRON HCL 4 MG/2ML IJ SOLN
4.0000 mg | Freq: Three times a day (TID) | INTRAMUSCULAR | Status: DC | PRN
  Administered 2012-01-09: 4 mg via INTRAVENOUS
  Filled 2012-01-09: qty 2

## 2012-01-09 MED ORDER — ONDANSETRON HCL 4 MG/2ML IJ SOLN
4.0000 mg | Freq: Once | INTRAMUSCULAR | Status: AC
  Administered 2012-01-09: 4 mg via INTRAVENOUS
  Filled 2012-01-09: qty 2

## 2012-01-09 MED ORDER — HYDROMORPHONE HCL PF 1 MG/ML IJ SOLN
1.0000 mg | Freq: Once | INTRAMUSCULAR | Status: AC
  Administered 2012-01-09: 1 mg via INTRAVENOUS
  Filled 2012-01-09: qty 1

## 2012-01-09 NOTE — ED Notes (Signed)
Pt c/o RUQ pain. Was supposed to see surgeon for removal last month, did not make appt.

## 2012-01-09 NOTE — ED Notes (Signed)
Report given to floor. Pt ready for transport to room 302.

## 2012-01-09 NOTE — ED Provider Notes (Signed)
History     CSN: [id number]  Arrival date & time 01/09/12  1801   First MD Initiated Contact with Patient 01/09/12 1911      Chief Complaint  Patient presents with  . Abdominal Pain    (Consider location/radiation/quality/duration/timing/severity/associated sxs/prior treatment) HPI Pt with known gall stones reports daily RUQ abdominal pain for the last several months, worse after eating and particularly severe since around dinner time tonight. Pain is severe, aching, not associated with vomiting or fever.   Past Medical History  Diagnosis Date  . Genital herpes   . Anxiety     Past Surgical History  Procedure Date  . Tonsillectomy   . Cesarean section 03/04/2011    Procedure: CESAREAN SECTION;  Surgeon: Lesly Dukes, MD;  Location: [facility];  Service: Gynecology;  Laterality: N/A;  primary of baby  boy at 64    History reviewed. No pertinent family history.  History  Substance Use Topics  . Smoking status: Current Everyday Smoker -- 0.5 packs/day for 5 years    Types: Cigarettes  . Smokeless tobacco: Never Used  . Alcohol Use: No    OB History    Grav Para Term Preterm Abortions TAB SAB Ect Mult Living   2 1 1  0 1 0 1 0 0 1      Review of Systems All other systems reviewed and are negative except as noted in HPI.    Allergies  Ceftriaxone and Latex  Home Medications  No current outpatient prescriptions on file.  BP 130/70  Pulse 85  Temp 97.7 F (36.5 C) (Oral)  Resp 20  Ht 5\' 10"  (1.778 m)  Wt 274 lb (124.286 kg)  BMI 39.32 kg/m2  SpO2 100%  LMP 01/09/2012  Physical Exam  Nursing note and vitals reviewed. Constitutional: She is oriented to person, place, and time. She appears well-developed and well-nourished.  HENT:  Head: Normocephalic and atraumatic.  Eyes: EOM are normal. Pupils are equal, round, and reactive to light.  Neck: Normal range of motion. Neck supple.  Cardiovascular: Normal rate, normal heart sounds and intact distal  pulses.   Pulmonary/Chest: Effort normal and breath sounds normal.  Abdominal: There is tenderness.       Tender in the RUQ but patient pushed my hand away precluding any more detailed exam  Musculoskeletal: Normal range of motion. She exhibits no edema and no tenderness.  Neurological: She is alert and oriented to person, place, and time. She has normal strength. No cranial nerve deficit or sensory deficit.  Skin: Skin is warm and dry. No rash noted.  Psychiatric: She has a normal mood and affect.    ED Course  Procedures (including critical care time)   Labs Reviewed  CBC WITH DIFFERENTIAL  COMPREHENSIVE METABOLIC PANEL  LIPASE, BLOOD   No results found.   No diagnosis found.    MDM  Pt with likely biliary colic. She was advised to followup with General surgery after a visit to [facility] in May for same but has not yet done so. Will check labs for evidence of infection or duct obstruction.   8:59 PM Lipase is slightly elevated, otherwise labs are normal. Discussed with Dr. Lovell Sheehan with Gen Surg who recommends hospitalist admission and [place] in the AM. Dr. Orvan Falconer to admit.       Charles B. Bernette Mayers, MD 01/09/12 2100

## 2012-01-10 ENCOUNTER — Inpatient Hospital Stay (HOSPITAL_COMMUNITY): Payer: Medicaid Other

## 2012-01-10 ENCOUNTER — Encounter (HOSPITAL_COMMUNITY): Payer: Self-pay | Admitting: Anesthesiology

## 2012-01-10 ENCOUNTER — Encounter (HOSPITAL_COMMUNITY)
Admission: EM | Disposition: A | Discharge status: Home / Self Care | Payer: Self-pay | Source: Home / Self Care | Attending: Internal Medicine

## 2012-01-10 ENCOUNTER — Encounter (HOSPITAL_COMMUNITY): Payer: Self-pay | Admitting: *Deleted

## 2012-01-10 ENCOUNTER — Inpatient Hospital Stay (HOSPITAL_COMMUNITY): Payer: Medicaid Other | Admitting: Anesthesiology

## 2012-01-10 ENCOUNTER — Encounter (HOSPITAL_COMMUNITY): Payer: Self-pay | Admitting: Internal Medicine

## 2012-01-10 DIAGNOSIS — F17200 Nicotine dependence, unspecified, uncomplicated: Secondary | ICD-10-CM

## 2012-01-10 DIAGNOSIS — R109 Unspecified abdominal pain: Secondary | ICD-10-CM | POA: Diagnosis present

## 2012-01-10 DIAGNOSIS — Z720 Tobacco use: Secondary | ICD-10-CM

## 2012-01-10 DIAGNOSIS — K8020 Calculus of gallbladder without cholecystitis without obstruction: Secondary | ICD-10-CM

## 2012-01-10 DIAGNOSIS — I498 Other specified cardiac arrhythmias: Secondary | ICD-10-CM

## 2012-01-10 DIAGNOSIS — R001 Bradycardia, unspecified: Secondary | ICD-10-CM | POA: Diagnosis not present

## 2012-01-10 HISTORY — PX: CHOLECYSTECTOMY: SHX55

## 2012-01-10 HISTORY — DX: Tobacco use: Z72.0

## 2012-01-10 LAB — COMPREHENSIVE METABOLIC PANEL
AST: 10 U/L (ref 0–37)
Albumin: 3.1 g/dL — ABNORMAL LOW (ref 3.5–5.2)
BUN: 7 mg/dL (ref 6–23)
Creatinine, Ser: 0.65 mg/dL (ref 0.50–1.10)
Total Protein: 6.5 g/dL (ref 6.0–8.3)

## 2012-01-10 LAB — CBC
HCT: 37.4 % (ref 36.0–46.0)
MCHC: 32.6 g/dL (ref 30.0–36.0)
MCV: 86.2 fL (ref 78.0–100.0)
Platelets: 358 10*3/uL (ref 150–400)
RDW: 13.7 % (ref 11.5–15.5)

## 2012-01-10 LAB — LIPASE, BLOOD: Lipase: 22 U/L (ref 11–59)

## 2012-01-10 LAB — APTT: aPTT: 33 seconds (ref 24–37)

## 2012-01-10 LAB — PROTIME-INR
INR: 1.14 (ref 0.00–1.49)
Prothrombin Time: 14.8 seconds (ref 11.6–15.2)

## 2012-01-10 LAB — TSH: TSH: 0.44 u[IU]/mL (ref 0.350–4.500)

## 2012-01-10 SURGERY — LAPAROSCOPIC CHOLECYSTECTOMY
Anesthesia: General | Site: Abdomen | Wound class: Contaminated

## 2012-01-10 MED ORDER — FENTANYL CITRATE 0.05 MG/ML IJ SOLN
25.0000 ug | INTRAMUSCULAR | Status: DC | PRN
  Administered 2012-01-10 (×4): 50 ug via INTRAVENOUS

## 2012-01-10 MED ORDER — 0.9 % SODIUM CHLORIDE (POUR BTL) OPTIME
TOPICAL | Status: DC | PRN
  Administered 2012-01-10: 1000 mL

## 2012-01-10 MED ORDER — LIDOCAINE HCL (CARDIAC) 10 MG/ML IV SOLN
INTRAVENOUS | Status: DC | PRN
  Administered 2012-01-10: 10 mg via INTRAVENOUS

## 2012-01-10 MED ORDER — FENTANYL CITRATE 0.05 MG/ML IJ SOLN
INTRAMUSCULAR | Status: DC | PRN
  Administered 2012-01-10: 100 ug via INTRAVENOUS
  Administered 2012-01-10 (×3): 50 ug via INTRAVENOUS

## 2012-01-10 MED ORDER — POTASSIUM CHLORIDE IN NACL 20-0.9 MEQ/L-% IV SOLN
INTRAVENOUS | Status: DC
  Administered 2012-01-10: 05:00:00 via INTRAVENOUS

## 2012-01-10 MED ORDER — BUPIVACAINE HCL (PF) 0.5 % IJ SOLN
INTRAMUSCULAR | Status: AC
  Filled 2012-01-10: qty 30

## 2012-01-10 MED ORDER — HEMOSTATIC AGENTS (NO CHARGE) OPTIME
TOPICAL | Status: DC | PRN
  Administered 2012-01-10: 1 via TOPICAL

## 2012-01-10 MED ORDER — HYDROMORPHONE HCL PF 1 MG/ML IJ SOLN
1.0000 mg | INTRAMUSCULAR | Status: DC | PRN
  Administered 2012-01-10 – 2012-01-11 (×5): 1 mg via INTRAVENOUS
  Filled 2012-01-10 (×5): qty 1

## 2012-01-10 MED ORDER — GLYCOPYRROLATE 0.2 MG/ML IJ SOLN
0.2000 mg | Freq: Once | INTRAMUSCULAR | Status: AC
  Administered 2012-01-10: 0.2 mg via INTRAVENOUS

## 2012-01-10 MED ORDER — LACTATED RINGERS IV SOLN
INTRAVENOUS | Status: DC
  Administered 2012-01-10: 1000 mL via INTRAVENOUS

## 2012-01-10 MED ORDER — SODIUM CHLORIDE 0.9 % IJ SOLN
INTRAMUSCULAR | Status: AC
  Administered 2012-01-10: 22:00:00
  Filled 2012-01-10: qty 3

## 2012-01-10 MED ORDER — MIDAZOLAM HCL 2 MG/2ML IJ SOLN
INTRAMUSCULAR | Status: AC
  Filled 2012-01-10: qty 2

## 2012-01-10 MED ORDER — CHLORHEXIDINE GLUCONATE 4 % EX LIQD: 1.0000 "application " | Freq: Once | CUTANEOUS | Status: DC

## 2012-01-10 MED ORDER — PROPOFOL 10 MG/ML IV EMUL
INTRAVENOUS | Status: AC
  Filled 2012-01-10: qty 20

## 2012-01-10 MED ORDER — MIDAZOLAM HCL 2 MG/2ML IJ SOLN
1.0000 mg | INTRAMUSCULAR | Status: DC | PRN
  Administered 2012-01-10: 2 mg via INTRAVENOUS

## 2012-01-10 MED ORDER — PROPOFOL 10 MG/ML IV EMUL
INTRAVENOUS | Status: DC | PRN
  Administered 2012-01-10: 150 mg via INTRAVENOUS

## 2012-01-10 MED ORDER — ONDANSETRON HCL 4 MG/2ML IJ SOLN
4.0000 mg | Freq: Four times a day (QID) | INTRAMUSCULAR | Status: DC | PRN
  Administered 2012-01-10 – 2012-01-11 (×3): 4 mg via INTRAVENOUS
  Filled 2012-01-10 (×3): qty 2

## 2012-01-10 MED ORDER — BISACODYL 10 MG RE SUPP: 10.0000 mg | Freq: Every day | RECTAL | Status: DC | PRN

## 2012-01-10 MED ORDER — FLEET ENEMA 7-19 GM/118ML RE ENEM: 1.0000 | ENEMA | Freq: Once | RECTAL | Status: DC | PRN

## 2012-01-10 MED ORDER — FENTANYL CITRATE 0.05 MG/ML IJ SOLN
INTRAMUSCULAR | Status: AC
  Filled 2012-01-10: qty 5

## 2012-01-10 MED ORDER — HYDROMORPHONE HCL PF 1 MG/ML IJ SOLN
0.5000 mg | INTRAMUSCULAR | Status: DC | PRN
  Administered 2012-01-10 (×2): 1 mg via INTRAVENOUS
  Filled 2012-01-10 (×2): qty 1

## 2012-01-10 MED ORDER — GLYCOPYRROLATE 0.2 MG/ML IJ SOLN
INTRAMUSCULAR | Status: AC
  Filled 2012-01-10: qty 1

## 2012-01-10 MED ORDER — SODIUM CHLORIDE 0.9 % IR SOLN
Status: DC | PRN
  Administered 2012-01-10: 3000 mL

## 2012-01-10 MED ORDER — ONDANSETRON HCL 4 MG/2ML IJ SOLN
4.0000 mg | INTRAMUSCULAR | Status: DC | PRN
  Administered 2012-01-10: 4 mg via INTRAVENOUS
  Filled 2012-01-10 (×2): qty 2

## 2012-01-10 MED ORDER — FENTANYL CITRATE 0.05 MG/ML IJ SOLN
INTRAMUSCULAR | Status: AC
  Filled 2012-01-10: qty 2

## 2012-01-10 MED ORDER — PANTOPRAZOLE SODIUM 40 MG PO TBEC
40.0000 mg | DELAYED_RELEASE_TABLET | Freq: Every day | ORAL | Status: DC
  Administered 2012-01-10: 40 mg via ORAL
  Filled 2012-01-10: qty 1

## 2012-01-10 MED ORDER — VANCOMYCIN HCL 1000 MG IV SOLR
1500.0000 mg | INTRAVENOUS | Status: AC
  Administered 2012-01-10: 1500 mg via INTRAVENOUS
  Filled 2012-01-10: qty 1500

## 2012-01-10 MED ORDER — ONDANSETRON HCL 4 MG/2ML IJ SOLN
INTRAMUSCULAR | Status: AC
  Filled 2012-01-10: qty 2

## 2012-01-10 MED ORDER — NEOSTIGMINE METHYLSULFATE 1 MG/ML IJ SOLN
INTRAMUSCULAR | Status: DC | PRN
  Administered 2012-01-10: 2 mg via INTRAVENOUS

## 2012-01-10 MED ORDER — ROCURONIUM BROMIDE 50 MG/5ML IV SOLN
INTRAVENOUS | Status: AC
  Filled 2012-01-10: qty 1

## 2012-01-10 MED ORDER — LACTATED RINGERS IV SOLN
INTRAVENOUS | Status: DC
  Administered 2012-01-11: 06:00:00 via INTRAVENOUS

## 2012-01-10 MED ORDER — GLYCOPYRROLATE 0.2 MG/ML IJ SOLN
INTRAMUSCULAR | Status: DC | PRN
  Administered 2012-01-10: 0.4 mg via INTRAVENOUS

## 2012-01-10 MED ORDER — ENOXAPARIN SODIUM 40 MG/0.4ML ~~LOC~~ SOLN: 40.0000 mg | SUBCUTANEOUS | Status: DC

## 2012-01-10 MED ORDER — ENOXAPARIN SODIUM 40 MG/0.4ML ~~LOC~~ SOLN
40.0000 mg | SUBCUTANEOUS | Status: DC
  Administered 2012-01-11: 40 mg via SUBCUTANEOUS
  Filled 2012-01-10 (×2): qty 0.4

## 2012-01-10 MED ORDER — ACETAMINOPHEN 10 MG/ML IV SOLN
1000.0000 mg | Freq: Four times a day (QID) | INTRAVENOUS | Status: AC
  Administered 2012-01-10 – 2012-01-11 (×4): 1000 mg via INTRAVENOUS
  Filled 2012-01-10 (×3): qty 100

## 2012-01-10 MED ORDER — GLYCOPYRROLATE 0.2 MG/ML IJ SOLN
INTRAMUSCULAR | Status: AC
  Filled 2012-01-10: qty 2

## 2012-01-10 MED ORDER — IOHEXOL 350 MG/ML SOLN
INTRAVENOUS | Status: DC | PRN
  Administered 2012-01-10: 50 mL via INTRAVENOUS

## 2012-01-10 MED ORDER — ONDANSETRON HCL 4 MG/2ML IJ SOLN
4.0000 mg | Freq: Once | INTRAMUSCULAR | Status: AC
  Administered 2012-01-10: 4 mg via INTRAVENOUS

## 2012-01-10 MED ORDER — ONDANSETRON HCL 4 MG/2ML IJ SOLN: 4.0000 mg | Freq: Once | INTRAMUSCULAR | Status: DC | PRN

## 2012-01-10 MED ORDER — ACETAMINOPHEN 10 MG/ML IV SOLN
INTRAVENOUS | Status: AC
  Filled 2012-01-10: qty 100

## 2012-01-10 MED ORDER — ONDANSETRON HCL 4 MG PO TABS: 4.0000 mg | ORAL_TABLET | Freq: Four times a day (QID) | ORAL | Status: DC | PRN

## 2012-01-10 MED ORDER — ROCURONIUM BROMIDE 100 MG/10ML IV SOLN
INTRAVENOUS | Status: DC | PRN
  Administered 2012-01-10: 5 mg via INTRAVENOUS
  Administered 2012-01-10: 35 mg via INTRAVENOUS

## 2012-01-10 MED ORDER — BUPIVACAINE HCL (PF) 0.5 % IJ SOLN
INTRAMUSCULAR | Status: DC | PRN
  Administered 2012-01-10: 10 mL

## 2012-01-10 SURGICAL SUPPLY — 46 items
APPLIER CLIP ROT 10 11.4 M/L (STAPLE) ×3
APR CLP MED LRG 11.4X10 (STAPLE) ×2
BAG HAMPER (MISCELLANEOUS) ×3 IMPLANT
BAG SPEC RTRVL LRG 6X4 10 (ENDOMECHANICALS) ×2
CATH CHOLANGIOGRAM 4.5FR (CATHETERS) ×3 IMPLANT
CLIP APPLIE ROT 10 11.4 M/L (STAPLE) ×2 IMPLANT
CLOTH BEACON ORANGE TIMEOUT ST (SAFETY) ×3 IMPLANT
COVER LIGHT HANDLE STERIS (MISCELLANEOUS) ×6 IMPLANT
COVER MAYO STAND XLG (DRAPE) ×3 IMPLANT
DECANTER SPIKE VIAL GLASS SM (MISCELLANEOUS) ×3 IMPLANT
DISSECTOR BLUNT TIP ENDO 5MM (MISCELLANEOUS) IMPLANT
DRAPE C-ARM FOLDED MOBILE STRL (DRAPES) ×3 IMPLANT
DURAPREP 26ML APPLICATOR (WOUND CARE) ×3 IMPLANT
ELECT REM PT RETURN 9FT ADLT (ELECTROSURGICAL) ×3
ELECTRODE REM PT RTRN 9FT ADLT (ELECTROSURGICAL) ×2 IMPLANT
FILTER SMOKE EVAC LAPAROSHD (FILTER) ×3 IMPLANT
FORMALIN 10 PREFIL 120ML (MISCELLANEOUS) ×3 IMPLANT
GLOVE BIO SURGEON STRL SZ7.5 (GLOVE) ×3 IMPLANT
GLOVE BIOGEL PI IND STRL 7.0 (GLOVE) ×2 IMPLANT
GLOVE BIOGEL PI INDICATOR 7.0 (GLOVE) ×2
GLOVE SKINSENSE NS SZ6.5 (GLOVE) ×1
GLOVE SKINSENSE NS SZ7.0 (GLOVE) ×1
GLOVE SKINSENSE NS SZ7.5 (GLOVE) ×1
GLOVE SKINSENSE STRL SZ6.5 (GLOVE) ×1 IMPLANT
GLOVE SKINSENSE STRL SZ7.0 (GLOVE) ×1 IMPLANT
GLOVE SKINSENSE STRL SZ7.5 (GLOVE) ×1 IMPLANT
GOWN STRL REIN XL XLG (GOWN DISPOSABLE) ×9 IMPLANT
HEMOSTAT SNOW SURGICEL 2X4 (HEMOSTASIS) ×3 IMPLANT
INST SET LAPROSCOPIC AP (KITS) ×3 IMPLANT
IV NS IRRIG 3000ML ARTHROMATIC (IV SOLUTION) ×2 IMPLANT
KIT ROOM TURNOVER APOR (KITS) ×3 IMPLANT
KIT TROCAR LAP CHOLE (TROCAR) ×3 IMPLANT
MANIFOLD NEPTUNE II (INSTRUMENTS) ×3 IMPLANT
NS IRRIG 1000ML POUR BTL (IV SOLUTION) ×3 IMPLANT
PACK LAP CHOLE LZT030E (CUSTOM PROCEDURE TRAY) ×3 IMPLANT
PAD ARMBOARD 7.5X6 YLW CONV (MISCELLANEOUS) ×3 IMPLANT
POUCH SPECIMEN RETRIEVAL 10MM (ENDOMECHANICALS) ×3 IMPLANT
SET BASIN LINEN APH (SET/KITS/TRAYS/PACK) ×3 IMPLANT
SET TUBE IRRIG SUCTION NO TIP (IRRIGATION / IRRIGATOR) ×2 IMPLANT
SPONGE GAUZE 2X2 8PLY STRL LF (GAUZE/BANDAGES/DRESSINGS) ×12 IMPLANT
STAPLER VISISTAT (STAPLE) ×3 IMPLANT
SUT VICRYL 0 UR6 27IN ABS (SUTURE) ×3 IMPLANT
SYR 20CC LL (SYRINGE) ×3 IMPLANT
SYR 30ML LL (SYRINGE) ×3 IMPLANT
WARMER LAPAROSCOPE (MISCELLANEOUS) ×3 IMPLANT
YANKAUER SUCT 12FT TUBE ARGYLE (SUCTIONS) ×3 IMPLANT

## 2012-01-10 NOTE — H&P (Signed)
[facility] History and Physical  Pamela [name] ZOX:[id number] DOB: June 16, 1985 DOA: 01/09/2012    PCP:   No primary provider on file.   Chief Complaint:  Right upper quadrant and chest pain worse since today  HPI: Pamela [name] is an 26 y.o. female.   Morbidly obese African American lady with known gallstones and chronic episodic right upper quadrant pain for the past 3 months, presents with sudden worsening of the pain since today. She says the pain is steady in the right flank radiates up to her chest and last for 5 minutes to one hour at a time 10 out of 10 in intensity. It is associated with nausea no vomiting/ She denies fever or chills she denies palpitations or diaphoresis; denies jaundice or black or bloody stool.  Rewiew of Systems:   All systems negative except as marked bold or noted in the HPI;  Constitutional: Negative for malaise, fever and chills. ;  Eyes: Negative for eye pain, redness and discharge. ;  ENMT: Negative for ear pain, hoarseness, nasal congestion, sinus pressure and sore throat. ;  Cardiovascular: Negative for palpitations, diaphoresis, dyspnea and peripheral edema. ;  Respiratory: Negative for cough, hemoptysis, wheezing and stridor. ;  Gastrointestinal: Negative for vomiting, diarrhea, constipation, melena, blood in stool, hematemesis, jaundice and rectal bleeding. unusual weight loss..   Genitourinary: Negative for frequency, dysuria, incontinence,flank pain and hematuria; Musculoskeletal: Negative for back pain and neck pain. Negative for swelling and trauma.;  Skin: . Negative for pruritus, rash, abrasions, bruising and skin lesion.; ulcerations Neuro: Negative for headache, lightheadedness and neck stiffness. Negative for weakness, altered level of consciousness , altered mental status, extremity weakness, burning feet, involuntary movement, seizure and syncope.  Psych: negative for, depression, insomnia, tearfulness, panic attacks, hallucinations,  paranoia, suicidal or homicidal ideation; anxiety   Past Medical History  Diagnosis Date  . Genital herpes   . Anxiety     Past Surgical History  Procedure Date  . Tonsillectomy   . Cesarean section 03/04/2011    Procedure: CESAREAN SECTION;  Surgeon: Lesly Dukes, MD;  Location: [facility];  Service: Gynecology;  Laterality: N/A;  primary of baby  boy at 38    Medications:  HOME MEDS: Prior to Admission medications   Not on File     Allergies:  Allergies  Allergen Reactions  . Ceftriaxone Hives and Shortness Of Breath    Penicillins OK  . Latex Itching and Other (See Comments)    REACTION: discoloring of skin    Social History:   reports that she has been smoking Cigarettes.  She has a 1.25 pack-year smoking history. She has never used smokeless tobacco. She reports that she uses illicit drugs (Marijuana) about twice per week. She reports that she does not drink alcohol.  Family History: Family History  Problem Relation Age of Onset  . Nephrolithiasis Father   . Graves' disease Father      Physical Exam: Filed Vitals:   01/09/12 1755 01/09/12 1756 01/09/12 2132 01/09/12 2212  BP: 130/70  119/48 106/70  Pulse: 85  75 62  Temp: 97.7 F (36.5 C)  97.4 F (36.3 C) 97.6 F (36.4 C)  TempSrc: Oral  Oral Oral  Resp: 20   20  Height:  5\' 10"  (1.778 m)  5\' 10"  (1.778 m)  Weight:  124.286 kg (274 lb)  135.399 kg (298 lb 8 oz)  SpO2: 100%  100% 96%   Blood pressure 106/70, pulse 62, temperature 97.6 F (36.4 C),  temperature source Oral, resp. rate 20, height 5\' 10"  (1.778 m), weight 135.399 kg (298 lb 8 oz), last menstrual period 01/09/2012, SpO2 96.00%, not currently breastfeeding.  GEN:  Pleasant morbidly obese African American lady lying in the stretcher; cooperative with exam PSYCH:  alert and oriented x4; does not appear anxious or depressed; affect is appropriate. HEENT: Mucous membranes pink and anicteric; PERRLA; EOM intact; no cervical lymphadenopathy  nor thyromegaly or carotid bruit; no JVD; markedly thick neck Breasts:: Not examined CHEST WALL: No tenderness CHEST: Normal respiration, clear to auscultation bilaterally HEART: Regular rate and rhythm; no murmurs rubs or gallops BACK: No kyphosis or scoliosis; no CVA tenderness ABDOMEN: Obese, exquisitely tender in the right anterior flank and epigastrium; no masses, no organomegaly, normal abdominal bowel sounds; large pannus; no intertriginous candida. Rectal Exam: Not done EXTREMITIES: No bone or joint deformity; age-appropriate arthropathy of the hands and knees; no edema; no ulcerations. Genitalia: not examined PULSES: 2+ and symmetric SKIN: Normal hydration no rash or ulceration CNS: Cranial nerves 2-12 grossly intact no focal lateralizing neurologic deficit   Labs on Admission:  Basic Metabolic Panel:  Lab 01/09/12 4098  NA 138  K 4.1  CL 104  CO2 27  GLUCOSE 84  BUN 8  CREATININE 0.61  CALCIUM 9.9  MG --  PHOS --   Liver Function Tests:  Lab 01/09/12 1915  AST 15  ALT 15  ALKPHOS 59  BILITOT 0.2*  PROT 7.2  ALBUMIN 3.5    Lab 01/09/12 1915  LIPASE 172*  AMYLASE --   No results found for this basename: AMMONIA:5 in the last 168 hours CBC:  Lab 01/09/12 1915  WBC 7.5  NEUTROABS 3.5  HGB 13.3  HCT 40.2  MCV 85.4  PLT 384   Cardiac Enzymes: No results found for this basename: CKTOTAL:5,CKMB:5,CKMBINDEX:5,TROPONINI:5 in the last 168 hours BNP: No components found with this basename: POCBNP:5 CBG: No results found for this basename: GLUCAP:5 in the last 168 hours  Results for orders placed during the hospital encounter of 01/09/12 (from the past 48 hour(s))  CBC WITH DIFFERENTIAL     Status: Normal   Collection Time   01/09/12  7:15 PM      Component Value Range Comment   WBC 7.5  4.0 - 10.5 K/uL    RBC 4.71  3.87 - 5.11 MIL/uL    Hemoglobin 13.3  12.0 - 15.0 g/dL    HCT 11.9  14.7 - 82.9 %    MCV 85.4  78.0 - 100.0 fL    MCH 28.2  26.0 - 34.0  pg    MCHC 33.1  30.0 - 36.0 g/dL    RDW 56.2  13.0 - 86.5 %    Platelets 384  150 - 400 K/uL    Neutrophils Relative 47  43 - 77 %    Neutro Abs 3.5  1.7 - 7.7 K/uL    Lymphocytes Relative 45  12 - 46 %    Lymphs Abs 3.4  0.7 - 4.0 K/uL    Monocytes Relative 7  3 - 12 %    Monocytes Absolute 0.5  0.1 - 1.0 K/uL    Eosinophils Relative 1  0 - 5 %    Eosinophils Absolute 0.1  0.0 - 0.7 K/uL    Basophils Relative 0  0 - 1 %    Basophils Absolute 0.0  0.0 - 0.1 K/uL   COMPREHENSIVE METABOLIC PANEL     Status: Abnormal   Collection Time  01/09/12  7:15 PM      Component Value Range Comment   Sodium 138  135 - 145 mEq/L    Potassium 4.1  3.5 - 5.1 mEq/L    Chloride 104  96 - 112 mEq/L    CO2 27  19 - 32 mEq/L    Glucose, Bld 84  70 - 99 mg/dL    BUN 8  6 - 23 mg/dL    Creatinine, Ser 9.81  0.50 - 1.10 mg/dL    Calcium 9.9  8.4 - 19.1 mg/dL    Total Protein 7.2  6.0 - 8.3 g/dL    Albumin 3.5  3.5 - 5.2 g/dL    AST 15  0 - 37 U/L    ALT 15  0 - 35 U/L    Alkaline Phosphatase 59  39 - 117 U/L    Total Bilirubin 0.2 (*) 0.3 - 1.2 mg/dL    GFR calc non Af Amer >90  >90 mL/min    GFR calc Af Amer >90  >90 mL/min   LIPASE, BLOOD     Status: Abnormal   Collection Time   01/09/12  7:15 PM      Component Value Range Comment   Lipase 172 (*) 11 - 59 U/L      Radiological Exams on Admission: No results found.   Assessment/Plan Present on Admission:  .Cholelithiasis .Pancreatitis .Abdominal pain .Morbid obesity   PLAN: Admit this lady for pain control, bowel rest, and abdominal ultrasound in the morning. She will likely need a surgical consult.  Counseled on nicotine cessation.  Counsel on dietary and exercise management of her morbid obesity  Other plans as per orders.  Code Status: Full code Family Communication:  Disposition Plan: Pending    Hugo Lybrand Nocturnist [facility] Pager [phone]   01/10/2012, 12:16 AM

## 2012-01-10 NOTE — Progress Notes (Signed)
Subjective: The patient has 8/10 epigastric and right upper quadrant abdominal pain. No nausea vomiting this morning.  Objective: Vital signs in last 24 hours: Filed Vitals:   01/09/12 1756 01/09/12 2132 01/09/12 2212 01/10/12 0522  BP:  119/48 106/70 101/67  Pulse:  75 62 53  Temp:  97.4 F (36.3 C) 97.6 F (36.4 C) 98.3 F (36.8 C)  TempSrc:  Oral Oral   Resp:   20 20  Height: 5\' 10"  (1.778 m)  5\' 10"  (1.778 m)   Weight: 124.286 kg (274 lb)  135.399 kg (298 lb 8 oz) 135.898 kg (299 lb 9.6 oz)  SpO2:  100% 96% 100%   No intake or output data in the 24 hours ending 01/10/12 0907  Weight change:   Physical exam: General: Pleasant morbidly obese 26 year old African-American woman laying in bed, in no acute distress. Lungs: Clear to auscultation bilaterally. Heart: S1, S2, with bradycardia. Abdomen: Obese, positive bowel sounds, soft, moderately tender in the epigastrium and right upper quadrant. No masses palpated. No hepatosplenomegaly. Extremities: No pedal edema.    Lab Results: Basic Metabolic Panel:  Basename 01/10/12 0517 01/09/12 1915  NA 138 138  K 4.2 4.1  CL 103 104  CO2 27 27  GLUCOSE 89 84  BUN 7 8  CREATININE 0.65 0.61  CALCIUM 9.5 9.9  MG -- --  PHOS -- --   Liver Function Tests:  [facility] 01/10/12 0517 01/09/12 1915  AST 10 15  ALT 13 15  ALKPHOS 51 59  BILITOT 0.2* 0.2*  PROT 6.5 7.2  ALBUMIN 3.1* 3.5    Basename 01/10/12 0517 01/09/12 1915  LIPASE 22 172*  AMYLASE -- --   No results found for this basename: AMMONIA:2 in the last 72 hours CBC:  Basename 01/10/12 0517 01/09/12 1915  WBC 8.4 7.5  NEUTROABS -- 3.5  HGB 12.2 13.3  HCT 37.4 40.2  MCV 86.2 85.4  PLT 358 384   Cardiac Enzymes: No results found for this basename: CKTOTAL:3,CKMB:3,CKMBINDEX:3,TROPONINI:3 in the last 72 hours BNP: No results found for this basename: PROBNP:3 in the last 72 hours D-Dimer: No results found for this basename: DDIMER:2 in the last 72  hours CBG: No results found for this basename: GLUCAP:6 in the last 72 hours Hemoglobin A1C: No results found for this basename: HGBA1C in the last 72 hours Fasting Lipid Panel: No results found for this basename: CHOL,HDL,LDLCALC,TRIG,CHOLHDL,LDLDIRECT in the last 72 hours Thyroid Function Tests: No results found for this basename: TSH,T4TOTAL,FREET4,T3FREE,THYROIDAB in the last 72 hours Anemia Panel: No results found for this basename: VITAMINB12,FOLATE,FERRITIN,TIBC,IRON,RETICCTPCT in the last 72 hours Coagulation:  Basename 01/10/12 0517  LABPROT 14.8  INR 1.14   Urine Drug Screen: Drugs of Abuse  No results found for this basename: labopia,  cocainscrnur,  labbenz,  amphetmu,  thcu,  labbarb    Alcohol Level: No results found for this basename: ETH:2 in the last 72 hours Urinalysis: No results found for this basename: COLORURINE:2,APPERANCEUR:2,LABSPEC:2,PHURINE:2,GLUCOSEU:2,HGBUR:2,BILIRUBINUR:2,KETONESUR:2,PROTEINUR:2,UROBILINOGEN:2,NITRITE:2,LEUKOCYTESUR:2 in the last 72 hours Misc. Labs:   Micro: No results found for this or any previous visit (from the past 240 hour(s)).  Studies/Results: No results found.  Medications:  Scheduled:   . enoxaparin (LOVENOX) injection  40 mg Subcutaneous Q24H  .  HYDROmorphone (DILAUDID) injection  1 mg Intravenous Once  . ondansetron  4 mg Intravenous Once   Continuous:   . 0.9 % NaCl with KCl 20 mEq / L 150 mL/hr at 01/10/12 0526   ZOX:WRUEAVWUJ, HYDROmorphone (DILAUDID) injection, ondansetron (ZOFRAN) IV,  sodium phosphate, DISCONTD:  HYDROmorphone (DILAUDID) injection, DISCONTD: ondansetron (ZOFRAN) IV  Assessment: Active Problems:  Cholelithiasis  Pancreatitis  Abdominal pain  Morbid obesity  Tobacco abuse  Bradycardia  This patient presents with known cholelithiasis diagnosed via an outpatient MRCP in May 2013. She was supposed to followup with General Surgery in [place]. She did not. Her lipase has normalized.  Her liver transaminases are within normal limits. Dr. Lovell Sheehan has evaluated the patient and will perform a cholecystectomy today. The patient was advised to stop smoking although she smokes only 3-4 cigarettes daily. A TSH will be ordered for evaluation of asymptomatic bradycardia. We'll continue supportive treatment an IV fluid hydration.  Plan: As above.   LOS: 1 day   Pamela [name] 01/10/2012, 9:07 AM

## 2012-01-10 NOTE — Anesthesia Preprocedure Evaluation (Signed)
Anesthesia Evaluation  Patient identified by MRN, date of birth, ID band Patient awake    Reviewed: Allergy & Precautions, H&P , NPO status , Patient's Chart, lab work & pertinent test results  Airway Mallampati: I TM Distance: >3 FB Neck ROM: Full    Dental No notable dental hx.    Pulmonary    Pulmonary exam normal       Cardiovascular negative cardio ROS  Rhythm:Regular Rate:Normal     Neuro/Psych Anxiety    GI/Hepatic negative GI ROS, Neg liver ROS,   Endo/Other  Morbid obesity  Renal/GU negative Renal ROS     Musculoskeletal negative musculoskeletal ROS (+)   Abdominal (+) + obese,  Abdomen: soft.    Peds  Hematology negative hematology ROS (+)   Anesthesia Other Findings   Reproductive/Obstetrics                           Anesthesia Physical Anesthesia Plan  ASA: II  Anesthesia Plan: General   Post-op Pain Management:    Induction: Intravenous  Airway Management Planned: Oral ETT  Additional Equipment:   Intra-op Plan:   Post-operative Plan: Extubation in OR  Informed Consent: I have reviewed the patients History and Physical, chart, labs and discussed the procedure including the risks, benefits and alternatives for the proposed anesthesia with the patient or authorized representative who has indicated his/her understanding and acceptance.   Dental advisory given  Plan Discussed with: CRNA  Anesthesia Plan Comments:         Anesthesia Quick Evaluation

## 2012-01-10 NOTE — Transfer of Care (Signed)
Immediate Anesthesia Transfer of Care Note  Patient: Pamela [name]  Procedure(s) Performed: Procedure(s) (LRB): LAPAROSCOPIC CHOLECYSTECTOMY (N/A)  Patient Location: PACU  Anesthesia Type: General  Level of Consciousness: awake  Airway & Oxygen Therapy: Patient Spontanous Breathing and non-rebreather face mask  Post-op Assessment: Report given to PACU RN, Post -op Vital signs reviewed and stable and Patient moving all extremities  Post vital signs: Reviewed and stable  Complications: No apparent anesthesia complications

## 2012-01-10 NOTE — Progress Notes (Signed)
UR chart review completed.  

## 2012-01-10 NOTE — Consult Note (Signed)
Reason for Consult: Gallstone pancreatitis Referring Physician: [facility]  Pamela [name] is an 26 y.o. female.  HPI: Patient is a 26 year old morbidly obese black female who presented emergency room yesterday evening with recurrent epigastric pain. She had been seen in the ER at [facility] several months ago and was diagnosed with cholelithiasis. She was noted to have a dilated common bile duct, but MRCP was negative for choledocholithiasis. In the emergency room, she was noted have an elevated lipase.  This morning, her lipase has normalized. She is still having some epigastric pain. She denies any jaundice.  Past Medical History  Diagnosis Date  . Genital herpes   . Anxiety   . Tobacco abuse 01/10/2012    Past Surgical History  Procedure Date  . Tonsillectomy   . Cesarean section 03/04/2011    Procedure: CESAREAN SECTION;  Surgeon: Lesly Dukes, MD;  Location: [facility];  Service: Gynecology;  Laterality: N/A;  primary of baby  boy at 69    Family History  Problem Relation Age of Onset  . Nephrolithiasis Father   . Graves' disease Father     Social History:  reports that she has been smoking Cigarettes.  She has a 1.25 pack-year smoking history. She has never used smokeless tobacco. She reports that she uses illicit drugs (Marijuana) about twice per week. She reports that she does not drink alcohol.  Allergies:  Allergies  Allergen Reactions  . Ceftriaxone Hives and Shortness Of Breath    Penicillins OK  . Latex Itching and Other (See Comments)    REACTION: discoloring of skin    Medications: I have reviewed the patient's current medications.  Results for orders placed during the hospital encounter of 01/09/12 (from the past 48 hour(s))  CBC WITH DIFFERENTIAL     Status: Normal   Collection Time   01/09/12  7:15 PM      Component Value Range Comment   WBC 7.5  4.0 - 10.5 K/uL    RBC 4.71  3.87 - 5.11 MIL/uL    Hemoglobin 13.3  12.0 - 15.0 g/dL    HCT  16.1  09.6 - 04.5 %    MCV 85.4  78.0 - 100.0 fL    MCH 28.2  26.0 - 34.0 pg    MCHC 33.1  30.0 - 36.0 g/dL    RDW 40.9  81.1 - 91.4 %    Platelets 384  150 - 400 K/uL    Neutrophils Relative 47  43 - 77 %    Neutro Abs 3.5  1.7 - 7.7 K/uL    Lymphocytes Relative 45  12 - 46 %    Lymphs Abs 3.4  0.7 - 4.0 K/uL    Monocytes Relative 7  3 - 12 %    Monocytes Absolute 0.5  0.1 - 1.0 K/uL    Eosinophils Relative 1  0 - 5 %    Eosinophils Absolute 0.1  0.0 - 0.7 K/uL    Basophils Relative 0  0 - 1 %    Basophils Absolute 0.0  0.0 - 0.1 K/uL   COMPREHENSIVE METABOLIC PANEL     Status: Abnormal   Collection Time   01/09/12  7:15 PM      Component Value Range Comment   Sodium 138  135 - 145 mEq/L    Potassium 4.1  3.5 - 5.1 mEq/L    Chloride 104  96 - 112 mEq/L    CO2 27  19 - 32 mEq/L  Glucose, Bld 84  70 - 99 mg/dL    BUN 8  6 - 23 mg/dL    Creatinine, Ser 2.84  0.50 - 1.10 mg/dL    Calcium 9.9  8.4 - 13.2 mg/dL    Total Protein 7.2  6.0 - 8.3 g/dL    Albumin 3.5  3.5 - 5.2 g/dL    AST 15  0 - 37 U/L    ALT 15  0 - 35 U/L    Alkaline Phosphatase 59  39 - 117 U/L    Total Bilirubin 0.2 (*) 0.3 - 1.2 mg/dL    GFR calc non Af Amer >90  >90 mL/min    GFR calc Af Amer >90  >90 mL/min   LIPASE, BLOOD     Status: Abnormal   Collection Time   01/09/12  7:15 PM      Component Value Range Comment   Lipase 172 (*) 11 - 59 U/L   CBC     Status: Normal   Collection Time   01/10/12  5:17 AM      Component Value Range Comment   WBC 8.4  4.0 - 10.5 K/uL    RBC 4.34  3.87 - 5.11 MIL/uL    Hemoglobin 12.2  12.0 - 15.0 g/dL    HCT 44.0  10.2 - 72.5 %    MCV 86.2  78.0 - 100.0 fL    MCH 28.1  26.0 - 34.0 pg    MCHC 32.6  30.0 - 36.0 g/dL    RDW 36.6  44.0 - 34.7 %    Platelets 358  150 - 400 K/uL   COMPREHENSIVE METABOLIC PANEL     Status: Abnormal   Collection Time   01/10/12  5:17 AM      Component Value Range Comment   Sodium 138  135 - 145 mEq/L    Potassium 4.2  3.5 - 5.1 mEq/L     Chloride 103  96 - 112 mEq/L    CO2 27  19 - 32 mEq/L    Glucose, Bld 89  70 - 99 mg/dL    BUN 7  6 - 23 mg/dL    Creatinine, Ser 4.25  0.50 - 1.10 mg/dL    Calcium 9.5  8.4 - 95.6 mg/dL    Total Protein 6.5  6.0 - 8.3 g/dL    Albumin 3.1 (*) 3.5 - 5.2 g/dL    AST 10  0 - 37 U/L    ALT 13  0 - 35 U/L    Alkaline Phosphatase 51  39 - 117 U/L    Total Bilirubin 0.2 (*) 0.3 - 1.2 mg/dL    GFR calc non Af Amer >90  >90 mL/min    GFR calc Af Amer >90  >90 mL/min   PROTIME-INR     Status: Normal   Collection Time   01/10/12  5:17 AM      Component Value Range Comment   Prothrombin Time 14.8  11.6 - 15.2 seconds    INR 1.14  0.00 - 1.49   APTT     Status: Normal   Collection Time   01/10/12  5:17 AM      Component Value Range Comment   aPTT 33  24 - 37 seconds   LIPASE, BLOOD     Status: Normal   Collection Time   01/10/12  5:17 AM      Component Value Range Comment   Lipase 22  11 - 59 U/L  No results found.  ROS: See chart Blood pressure 101/67, pulse 53, temperature 98.3 F (36.8 C), temperature source Oral, resp. rate 20, height 5\' 10"  (1.778 m), weight 135.898 kg (299 lb 9.6 oz), last menstrual period 01/09/2012, SpO2 100.00%, not currently breastfeeding. Physical Exam: Morbidly obese black female in no acute distress. HEENT: No scleral icterus Abdomen: Soft with tenderness noted in the epigastric region. No hepatosplenomegaly, masses, or hernias.   Assessment/Plan: Impression: Gallstone pancreatitis Plan: We'll take the patient today to the operating room for laparoscopic cholecystectomy with cholangiograms. The risks and benefits of the procedure including bleeding, infection, hepatobiliary, the possibly of an open procedure were fully explained to the patient, gave informed consent.  Rhyanna Sorce A 01/10/2012, 8:33 AM

## 2012-01-10 NOTE — Op Note (Signed)
Patient:  Pamela [name]  DOB:  1985-09-20  MRN:  [id number]   Preop Diagnosis:  Gallstone pancreatitis  Postop Diagnosis:  Same  Procedure:  Laparoscopic cholecystectomy  Surgeon:  Franky Macho, M.D.  Anes:  General endotracheal  Indications:  Patient is a 26 year old obese black female presents with gallstone pancreatitis. She's been seen in the emergency room at [facility] several months ago was found to have cholelithiasis. MRCP was negative for choledocholithiasis. She was unable to followup with the surgeons in [place], thus came to the emergency rooma at [facility]. Initially, her lipase was elevated but quickly returned to normal on hospital day one. She now comes to the operating room for laparoscopic cholecystectomy, possible cholangiogram. The risks and benefits of the procedure including bleeding, infection, hepatobiliary injury, the possibly of an open procedure were fully explained to the patient, gave informed consent.  Procedure note:  Patient is placed the supine position. After induction of general endotracheal anesthesia, the abdomen was prepped and draped using the usual sterile technique with DuraPrep. Surgical site confirmation was performed.  A supraumbilical incision was made down the fascia. A Veress needle was introduced into the abdominal cavity and confirmation of placement was done using the saline drop test. The abdomen was then insufflated to 16 mm mercury pressure. An 11 mm trocar was introduced into the abdominal cavity under direct visualization without difficulty. The patient was placed in reverse Trendelenburg position additional 11 mm trocar was placed the epigastric region. 5 mm trochars were placed the right upper quadrant and right flank regions. The liver was inspected and noted within normal limits. The gallbladder was retracted superiorly and laterally. The dissection was begun around the infundibulum of the gallbladder. The cystic duct was  first identified. Junction to the infundibulum flow identified. A single Endo Clip was placed proximally on the cystic duct. An incision was made in the cystic duct and a cholangiocatheter was inserted. I could not maintain adequate flow in the cystic duct despite it being widely patent and no evidence of obstruction. After multiple attempts, was elected to abort the cholangiogram. Multiple endoclips were placed distally on the cystic duct and cystic duct was divided. The cystic artery was likewise ligated and divided. The gallbladder is then freed away from the gallbladder fossa using Bovie electrocautery. The gallbladder was delivered through the epigastric trocar site using an Endo Catch bag. The gallbladder fossa was inspected no abnormal bleeding or bile leakage was noted. Surgicel is placed in the gallbladder fossa. All fluid and air were then evacuated from the abdominal cavity prior to removal of the trochars.  All wounds were gave normal saline. All wounds were checked with 0.5% Sensorcaine. The supraumbilical fascia was reapproximated using an 0 Vicryl interrupted suture. All skin incisions were closed using staples. Betadine ointment dry sterile dressings were applied.  All tape and needle counts were correct at the end of the procedure. The patient was extubated in the operating room and went back to recovery room awake in stable condition.  Complications:  None  EBL:  Minimal  Specimen:  Gallbladder with stones

## 2012-01-10 NOTE — Anesthesia Postprocedure Evaluation (Signed)
Anesthesia Post Note  Patient: Pamela [name]  Procedure(s) Performed: Procedure(s) (LRB): LAPAROSCOPIC CHOLECYSTECTOMY (N/A)  Anesthesia type: General  Patient location: PACU  Post pain: Pain level controlled  Post assessment: Post-op Vital signs reviewed, Patient's Cardiovascular Status Stable, Respiratory Function Stable, Patent Airway, No signs of Nausea or vomiting and Pain level controlled  Last Vitals:  Filed Vitals:   01/10/12 1259  BP: 142/51  Pulse: 83  Temp: 36.6 C  Resp: 18    Post vital signs: Reviewed and stable  Level of consciousness: awake and alert   Complications: No apparent anesthesia complications

## 2012-01-10 NOTE — Anesthesia Procedure Notes (Signed)
Procedure Name: Intubation Date/Time: 01/10/2012 12:12 PM Performed by: Franco Nones Pre-anesthesia Checklist: Patient identified, Patient being monitored, Timeout performed, Emergency Drugs available and Suction available Patient Re-evaluated:Patient Re-evaluated prior to inductionOxygen Delivery Method: Circle System Utilized Preoxygenation: Pre-oxygenation with 100% oxygen Intubation Type: IV induction, Rapid sequence and Cricoid Pressure applied Laryngoscope Size: Miller and 2 Grade View: Grade I Tube type: Oral Tube size: 7.0 mm Number of attempts: 1 Airway Equipment and Method: stylet Placement Confirmation: ETT inserted through vocal cords under direct vision,  positive ETCO2 and breath sounds checked- equal and bilateral Secured at: 21 cm Tube secured with: Tape Dental Injury: Teeth and Oropharynx as per pre-operative assessment

## 2012-01-10 NOTE — Progress Notes (Signed)
Pt having menses. Tampon removed by pt and sanitary napkin placed between legs by pt.

## 2012-01-11 LAB — CBC
MCHC: 32.5 g/dL (ref 30.0–36.0)
Platelets: 358 10*3/uL (ref 150–400)
RDW: 13.7 % (ref 11.5–15.5)

## 2012-01-11 LAB — BASIC METABOLIC PANEL
GFR calc Af Amer: >90 mL/min (ref >90)
GFR calc non Af Amer: >90 mL/min (ref >90)
Potassium: 4.1 mEq/L (ref 3.5–5.1)
Sodium: 138 mEq/L (ref 135–145)

## 2012-01-11 LAB — HEPATIC FUNCTION PANEL
Alkaline Phosphatase: 53 U/L (ref 39–117)
Total Protein: 6.4 g/dL (ref 6.0–8.3)

## 2012-01-11 LAB — PHOSPHORUS: Phosphorus: 3.8 mg/dL (ref 2.3–4.6)

## 2012-01-11 LAB — AMYLASE: Amylase: 48 U/L (ref 0–105)

## 2012-01-11 MED ORDER — OXYCODONE-ACETAMINOPHEN 7.5-325 MG PO TABS: 1.0000 | ORAL_TABLET | ORAL | Status: DC | PRN

## 2012-01-11 NOTE — Progress Notes (Signed)
Patient received discharge instructions along with follow up appointments and prescriptions. Patient verbalized understanding of all instructions. Patient was escorted by staff via wheelchair to vehicle. Patient discharged to home in stable condition. 

## 2012-01-11 NOTE — Addendum Note (Signed)
Addendum  created 01/11/12 1031 by Franco Nones, CRNA   Modules edited:Notes Section

## 2012-01-11 NOTE — Anesthesia Postprocedure Evaluation (Signed)
Anesthesia Post Note  Patient: Pamela [name]  Procedure(s) Performed: Procedure(s) (LRB): LAPAROSCOPIC CHOLECYSTECTOMY (N/A)  Anesthesia type: General  Patient location: 302  Post pain: Pain level controlled  Post assessment: Post-op Vital signs reviewed, Patient's Cardiovascular Status Stable, Respiratory Function Stable, Patent Airway, No signs of Nausea or vomiting and Pain level controlled  Last Vitals:  Filed Vitals:   01/11/12 0500  BP: 106/76  Pulse: 65  Temp: 36.7 C  Resp: 20    Post vital signs: Reviewed and stable  Level of consciousness: awake and alert  Complications: No apparent anesthesia complications  Being discharged to day

## 2012-01-11 NOTE — Discharge Summary (Signed)
Physician Discharge Summary  Patient ID: Pamela [name] MRN: [id number] DOB/AGE: 1986/02/11 25 y.o.  Admit date: 01/09/2012 Discharge date: 01/11/2012  Admission Diagnoses: Gallstone pancreatitis  Discharge Diagnoses: Same Active Problems:  Cholelithiasis  Pancreatitis  Abdominal pain  Morbid obesity  Tobacco abuse  Bradycardia   Discharged Condition: good  Hospital Course: Patient is a 26 year old morbidly obese black female who presented to [facility] with worsening epigastric right upper quadrant abdominal pain. She has a known history of cholelithiasis, but she did not followup with Gen. surgery in [place]. She was noted on admission to have an elevated lipase. Her LFTs were within normal limits. These subsequently returned to normal the following day. She underwent a laparoscopic cholecystectomy on 01/10/2012. I attempted a cholangiogram, but just could not be performed due to a significantly narrowed cystic duct. Previous MRCP done approximately 3 months ago was negative for choledocholithiasis. She tolerated the procedure well. Her postoperative course has been unremarkable. Her diet was advanced at difficulty. She is being discharged home on postoperative day one in good improving condition.  Treatments: surgery: Laparoscopic cholecystectomy on 01/10/2012  Discharge Exam: Blood pressure 106/76, pulse 65, temperature 98 F (36.7 C), temperature source Oral, resp. rate 20, height 5\' 10"  (1.778 m), weight 138.801 kg (306 lb), last menstrual period 01/09/2012, SpO2 99.00%, not currently breastfeeding. General appearance: alert, cooperative and no distress Eyes: No scleral icterus Resp: clear to auscultation bilaterally Cardio: regular rate and rhythm, S1, S2 normal, no murmur, click, rub or gallop GI: Soft, flat. Dressings dry and intact.  Disposition: 01-Home or Self Care   Medication List  As of 01/11/2012  8:55 AM   TAKE these medications        oxyCODONE-acetaminophen 7.5-325 MG per tablet   Commonly known as: PERCOCET   Take 1-2 tablets by mouth every 4 (four) hours as needed for pain.           Follow-up Information    Follow up with Dalia Heading, MD. Schedule an appointment as soon as possible for a visit on 01/21/2012.   Contact information:   [address] [place] [address] [phone]          Signed: Dalia Heading 01/11/2012, 8:55 AM

## 2012-01-13 ENCOUNTER — Encounter (HOSPITAL_COMMUNITY): Payer: Self-pay | Admitting: General Surgery

## 2012-01-20 ENCOUNTER — Emergency Department (HOSPITAL_COMMUNITY): Payer: Medicaid Other

## 2012-01-20 ENCOUNTER — Emergency Department (HOSPITAL_COMMUNITY)
Admission: EM | Admit: 2012-01-20 | Discharge: 2012-01-20 | Disposition: A | Discharge status: Home / Self Care | Payer: Medicaid Other | Attending: Emergency Medicine | Admitting: Emergency Medicine

## 2012-01-20 ENCOUNTER — Encounter (HOSPITAL_COMMUNITY): Payer: Self-pay

## 2012-01-20 DIAGNOSIS — R0602 Shortness of breath: Secondary | ICD-10-CM | POA: Insufficient documentation

## 2012-01-20 DIAGNOSIS — R071 Chest pain on breathing: Secondary | ICD-10-CM | POA: Insufficient documentation

## 2012-01-20 DIAGNOSIS — R05 Cough: Secondary | ICD-10-CM | POA: Insufficient documentation

## 2012-01-20 DIAGNOSIS — Z9089 Acquired absence of other organs: Secondary | ICD-10-CM | POA: Insufficient documentation

## 2012-01-20 DIAGNOSIS — R059 Cough, unspecified: Secondary | ICD-10-CM | POA: Insufficient documentation

## 2012-01-20 DIAGNOSIS — F17200 Nicotine dependence, unspecified, uncomplicated: Secondary | ICD-10-CM | POA: Insufficient documentation

## 2012-01-20 DIAGNOSIS — G8918 Other acute postprocedural pain: Secondary | ICD-10-CM

## 2012-01-20 LAB — COMPREHENSIVE METABOLIC PANEL
Albumin: 3.3 g/dL — ABNORMAL LOW (ref 3.5–5.2)
Alkaline Phosphatase: 73 U/L (ref 39–117)
BUN: 7 mg/dL (ref 6–23)
Potassium: 3.9 mEq/L (ref 3.5–5.1)
Sodium: 138 mEq/L (ref 135–145)
Total Protein: 6.9 g/dL (ref 6.0–8.3)

## 2012-01-20 LAB — CBC
HCT: 37.6 % (ref 36.0–46.0)
MCV: 85.1 fL (ref 78.0–100.0)
RDW: 13.7 % (ref 11.5–15.5)
WBC: 8.3 10*3/uL (ref 4.0–10.5)

## 2012-01-20 MED ORDER — HYDROCODONE-ACETAMINOPHEN 5-325 MG PO TABS: 1.0000 | ORAL_TABLET | Freq: Four times a day (QID) | ORAL | Status: AC | PRN

## 2012-01-20 MED ORDER — DIAZEPAM 5 MG PO TABS
ORAL_TABLET | ORAL | Status: AC
  Filled 2012-01-20: qty 1

## 2012-01-20 MED ORDER — ONDANSETRON HCL 4 MG/2ML IJ SOLN
4.0000 mg | INTRAMUSCULAR | Status: AC | PRN
  Administered 2012-01-20 (×2): 4 mg via INTRAVENOUS
  Filled 2012-01-20 (×2): qty 2

## 2012-01-20 MED ORDER — MORPHINE SULFATE 4 MG/ML IJ SOLN
8.0000 mg | Freq: Once | INTRAMUSCULAR | Status: AC
  Administered 2012-01-20: 8 mg via INTRAVENOUS
  Filled 2012-01-20: qty 2

## 2012-01-20 MED ORDER — IOHEXOL 350 MG/ML SOLN
80.0000 mL | Freq: Once | INTRAVENOUS | Status: AC | PRN
  Administered 2012-01-20: 80 mL via INTRAVENOUS

## 2012-01-20 MED ORDER — GI COCKTAIL ~~LOC~~
30.0000 mL | Freq: Once | ORAL | Status: AC
  Administered 2012-01-20: 30 mL via ORAL
  Filled 2012-01-20: qty 30

## 2012-01-20 MED ORDER — DIAZEPAM 5 MG PO TABS
5.0000 mg | ORAL_TABLET | Freq: Once | ORAL | Status: AC
  Administered 2012-01-20: 5 mg via ORAL

## 2012-01-20 MED ORDER — MORPHINE SULFATE 4 MG/ML IJ SOLN: 4.0000 mg | INTRAMUSCULAR | Status: DC

## 2012-01-20 MED ORDER — MORPHINE SULFATE 4 MG/ML IJ SOLN
4.0000 mg | Freq: Once | INTRAMUSCULAR | Status: AC
  Administered 2012-01-20: 4 mg via INTRAVENOUS
  Filled 2012-01-20: qty 1

## 2012-01-20 NOTE — ED Notes (Signed)
BIB by EMS for pain in her epigastric region after picking up her 46 month old child and having had her gallbladder removed 5 days ago. States the pain worsens with movement and breathing. Pain started this morning around 0930. Also reports a cough all night long that kept her up.

## 2012-01-20 NOTE — ED Notes (Signed)
Pt states she would not like pain medication, she would like to be discharged, states pain is very manageable.

## 2012-01-20 NOTE — ED Provider Notes (Signed)
History     CSN: [id number]  Arrival date & time 01/20/12  1009   First MD Initiated Contact with Patient 01/20/12 1012      Chief Complaint  Patient presents with  . Panic Attack    (Consider location/radiation/quality/duration/timing/severity/associated sxs/prior treatment) HPI Comments: Pt is an obese AA female w a hx of anxiety and recent lap chole that presents to the ER w cc of CP. Onset of symptoms occurred this morning around 9:00 AM and have been gradually worsening. Pain is located in the mid sternal chest area and does not radiate, described as constant sharp worsened with deep inhalation and associated with SOB and cough. Pain is worsened with moving and deep breath. Pt denies hemoptysis, leg swelling, abdominal pain, constipation, fever, night sweats, or chills. No other complaints at this time. Note pt is a current every day smoker taking birthcontrol.   Laparoscopic cholecystectomy on 01/10/2012 MRCP done approximately 3 months ago was negative for choledocholithiasis   The history is provided by the patient.    Past Medical History  Diagnosis Date  . Genital herpes   . Anxiety   . Tobacco abuse 01/10/2012    Past Surgical History  Procedure Date  . Tonsillectomy   . Cesarean section 03/04/2011    Procedure: CESAREAN SECTION;  Surgeon: Lesly Dukes, MD;  Location: [facility];  Service: Gynecology;  Laterality: N/A;  primary of baby  boy at 28  . Cholecystectomy 01/10/2012    Procedure: LAPAROSCOPIC CHOLECYSTECTOMY;  Surgeon: Dalia Heading, MD;  Location: [facility];  Service: General;  Laterality: N/A;  attempted Intraopertive cholangiogram    Family History  Problem Relation Age of Onset  . Nephrolithiasis Father   . Graves' disease Father     History  Substance Use Topics  . Smoking status: Current Everyday Smoker -- 0.2 packs/day for 5 years    Types: Cigarettes  . Smokeless tobacco: Never Used  . Alcohol Use: No    OB History    Grav Para Term  Preterm Abortions TAB SAB Ect Mult Living   2 1 1  0 1 0 1 0 0 1      Review of Systems  Constitutional: Negative for fever, chills and appetite change.  HENT: Negative for congestion.   Eyes: Negative for visual disturbance.  Respiratory: Positive for cough and shortness of breath.   Cardiovascular: Positive for chest pain. Negative for palpitations and leg swelling.  Gastrointestinal: Negative for abdominal pain.  Genitourinary: Negative for dysuria, urgency and frequency.  Skin: Negative for color change.  Neurological: Negative for dizziness, syncope, weakness, light-headedness, numbness and headaches.  Psychiatric/Behavioral: Negative for confusion.  All other systems reviewed and are negative.    Allergies  Ceftriaxone and Latex  Home Medications  No current outpatient prescriptions on file.  BP 137/85  Pulse 86  Temp 98.2 F (36.8 C) (Oral)  Resp 22  Ht 5\' 10"  (1.778 m)  Wt 306 lb (138.801 kg)  BMI 43.91 kg/m2  SpO2 100%  LMP 01/09/2012  Breastfeeding? No  Physical Exam  Constitutional: She is oriented to person, place, and time. She appears well-developed and well-nourished. No distress.  HENT:  Head: Normocephalic and atraumatic.  Mouth/Throat: Oropharynx is clear and moist. No oropharyngeal exudate.  Eyes: Conjunctivae and EOM are normal. Pupils are equal, round, and reactive to light. No scleral icterus.  Neck: Normal range of motion. Neck supple. No tracheal deviation present. No thyromegaly present.  Cardiovascular: Normal rate, regular rhythm, normal heart sounds  and intact distal pulses.        No pitting edema  Pulmonary/Chest: Effort normal and breath sounds normal. No stridor. No respiratory distress. She has no wheezes.  Abdominal: Soft.       Mild ttp over surgical incisions & in the epigastric areas  Musculoskeletal: Normal range of motion. She exhibits no edema and no tenderness.  Neurological: She is alert and oriented to person, place, and  time. Coordination normal.  Skin: Skin is warm and dry. No rash noted. She is not diaphoretic. No erythema. No pallor.  Psychiatric: She has a normal mood and affect. Her behavior is normal.    ED Course  Procedures (including critical care time)  Labs Reviewed - No data to display No results found.   No diagnosis found.   Date: 01/20/2012  Rate: 80  Rhythm: normal sinus rhythm  QRS Axis: normal  Intervals: normal  ST/T Wave abnormalities: normal  Conduction Disutrbances: none  Narrative Interpretation:   Old EKG Reviewed: No significant changes noted     MDM  Pt to ER w cc of pleuritic CP associated w SOB & cough. Risk factors for PE include obesity, recent surgery, current every day smoking & birthcontrol use. CTangio pending. Pain managed in ER.   2:18 PM Imaging & labs reviewed. Pain managed in ED. No evidence of PE. CP likely post surgical etiology. No signs of infection.  At this time there does not appear to be any evidence of an acute emergency medical condition and the patient appears stable for discharge with appropriate outpatient follow up     [facility], PA-C 01/20/12 1528

## 2012-01-22 NOTE — ED Provider Notes (Signed)
Medical screening examination/treatment/procedure(s) were performed by non-physician practitioner and as supervising physician I was immediately available for consultation/collaboration.  Shelda Jakes, MD 01/22/12 2042

## 2012-04-29 ENCOUNTER — Emergency Department (HOSPITAL_COMMUNITY)
Admission: EM | Admit: 2012-04-29 | Discharge: 2012-04-29 | Disposition: A | Discharge status: Home / Self Care | Payer: Medicaid Other | Attending: Emergency Medicine | Admitting: Emergency Medicine

## 2012-04-29 ENCOUNTER — Encounter (HOSPITAL_COMMUNITY): Payer: Self-pay | Admitting: *Deleted

## 2012-04-29 DIAGNOSIS — Z3490 Encounter for supervision of normal pregnancy, unspecified, unspecified trimester: Secondary | ICD-10-CM

## 2012-04-29 DIAGNOSIS — J029 Acute pharyngitis, unspecified: Secondary | ICD-10-CM | POA: Insufficient documentation

## 2012-04-29 DIAGNOSIS — H9209 Otalgia, unspecified ear: Secondary | ICD-10-CM | POA: Insufficient documentation

## 2012-04-29 DIAGNOSIS — R1033 Periumbilical pain: Secondary | ICD-10-CM | POA: Insufficient documentation

## 2012-04-29 DIAGNOSIS — F17200 Nicotine dependence, unspecified, uncomplicated: Secondary | ICD-10-CM | POA: Insufficient documentation

## 2012-04-29 DIAGNOSIS — Z3201 Encounter for pregnancy test, result positive: Secondary | ICD-10-CM | POA: Insufficient documentation

## 2012-04-29 DIAGNOSIS — R05 Cough: Secondary | ICD-10-CM

## 2012-04-29 DIAGNOSIS — Z8659 Personal history of other mental and behavioral disorders: Secondary | ICD-10-CM | POA: Insufficient documentation

## 2012-04-29 DIAGNOSIS — R059 Cough, unspecified: Secondary | ICD-10-CM | POA: Insufficient documentation

## 2012-04-29 DIAGNOSIS — A6000 Herpesviral infection of urogenital system, unspecified: Secondary | ICD-10-CM | POA: Insufficient documentation

## 2012-04-29 LAB — URINALYSIS, ROUTINE W REFLEX MICROSCOPIC
Bilirubin Urine: NEGATIVE
Hgb urine dipstick: NEGATIVE
Nitrite: NEGATIVE
Protein, ur: NEGATIVE mg/dL
Urobilinogen, UA: 0.2 mg/dL (ref 0.0–1.0)

## 2012-04-29 LAB — POCT PREGNANCY, URINE: Preg Test, Ur: POSITIVE — AB

## 2012-04-29 MED ORDER — AZITHROMYCIN 250 MG PO TABS: ORAL_TABLET | ORAL | Status: DC

## 2012-04-29 MED ORDER — AZITHROMYCIN 250 MG PO TABS
500.0000 mg | ORAL_TABLET | Freq: Once | ORAL | Status: AC
  Administered 2012-04-29: 500 mg via ORAL
  Filled 2012-04-29: qty 2

## 2012-04-29 NOTE — ED Notes (Signed)
RN at bedside

## 2012-04-29 NOTE — ED Provider Notes (Signed)
History     CSN: [id number]  Arrival date & time 04/29/12  1011   First MD Initiated Contact with Patient 04/29/12 1023      Chief Complaint  Patient presents with  . Cough  . Abdominal Pain    (Consider location/radiation/quality/duration/timing/severity/associated sxs/prior treatment) HPI Comments: Sore throat, cough, runny nose and ear irritation.  No fever or chills.  Also, noted vaginal blood when washing in shower this AM.  Denies any continued vaginal bleeding.  Mild dysuria, frequency and urgency since last.  LMP 02-09-12.  Normally has regular menses.    Patient is a 26 y.o. female presenting with cough and abdominal pain. The history is provided by the patient. No language interpreter was used.  Cough This is a new problem. Episode onset: 5 days ago. The problem occurs every few minutes. The problem has not changed since onset.The cough is productive of sputum. Associated symptoms include ear pain, rhinorrhea and sore throat. Pertinent negatives include no chills and no myalgias. She has tried nothing for the symptoms. She is not a smoker. Her past medical history does not include pneumonia.  Abdominal Pain The primary symptoms of the illness include abdominal pain. The primary symptoms of the illness do not include fever.  Symptoms associated with the illness do not include chills.    Past Medical History  Diagnosis Date  . Genital herpes   . Anxiety   . Tobacco abuse 01/10/2012    Past Surgical History  Procedure Date  . Tonsillectomy   . Cesarean section 03/04/2011    Procedure: CESAREAN SECTION;  Surgeon: Lesly Dukes, MD;  Location: [facility];  Service: Gynecology;  Laterality: N/A;  primary of baby  boy at 21  . Cholecystectomy 01/10/2012    Procedure: LAPAROSCOPIC CHOLECYSTECTOMY;  Surgeon: Dalia Heading, MD;  Location: [facility];  Service: General;  Laterality: N/A;  attempted Intraopertive cholangiogram    Family History  Problem Relation Age of Onset  .  Nephrolithiasis Father   . Graves' disease Father     History  Substance Use Topics  . Smoking status: Current Every Day Smoker -- 0.2 packs/day for 5 years    Types: Cigarettes  . Smokeless tobacco: Never Used  . Alcohol Use: No    OB History    Grav Para Term Preterm Abortions TAB SAB Ect Mult Living   2 1 1  0 1 0 1 0 0 1      Review of Systems  Constitutional: Negative for fever and chills.  HENT: Positive for ear pain, sore throat and rhinorrhea.   Respiratory: Positive for cough.   Gastrointestinal: Positive for abdominal pain.  Musculoskeletal: Negative for myalgias.  All other systems reviewed and are negative.    Allergies  Ceftriaxone and Latex  Home Medications   Current Outpatient Rx  Name  Route  Sig  Dispense  Refill  . AZITHROMYCIN 250 MG PO TABS      One tab po QD (initial dose given in ED)   4 tablet   0     BP 138/53  Pulse 89  Temp 98.3 F (36.8 C) (Oral)  Resp 16  Ht 5\' 10"  (1.778 m)  Wt 275 lb (124.739 kg)  BMI 39.46 kg/m2  SpO2 95%  LMP 02/04/2012  Physical Exam  Nursing note and vitals reviewed. Constitutional: She is oriented to person, place, and time. She appears well-developed and well-nourished. No distress.  HENT:  Head: Normocephalic and atraumatic.  Right Ear: Hearing, tympanic membrane,  external ear and ear canal normal.  Left Ear: Hearing, tympanic membrane, external ear and ear canal normal.  Mouth/Throat: Uvula is midline, oropharynx is clear and moist and mucous membranes are normal. No uvula swelling.  Eyes: EOM are normal.  Neck: Normal range of motion.  Cardiovascular: Normal rate, regular rhythm and normal heart sounds.   Pulmonary/Chest: Effort normal and breath sounds normal. No accessory muscle usage. Not tachypneic. No respiratory distress. She has no decreased breath sounds.  Abdominal: Soft. She exhibits no distension. There is no hepatosplenomegaly. There is tenderness in the suprapubic area. There is no  rigidity, no guarding, no CVA tenderness, no tenderness at McBurney's point and negative Murphy's sign.    Genitourinary: No vaginal discharge found.  Musculoskeletal: Normal range of motion.  Neurological: She is alert and oriented to person, place, and time.  Skin: Skin is warm and dry.  Psychiatric: She has a normal mood and affect. Judgment normal.    ED Course  Procedures (including critical care time)  Labs Reviewed  POCT PREGNANCY, URINE - Abnormal; Notable for the following:    Preg Test, Ur POSITIVE (*)     All other components within normal limits  HCG, QUANTITATIVE, PREGNANCY - Abnormal; Notable for the following:    hCG, Beta Chain, Quant, [place] [address] (*)     All other components within normal limits  URINALYSIS, ROUTINE W REFLEX MICROSCOPIC   No results found.   1. Cough   2. Pregnancy       MDM  rx-zithromax 250 mg, 4 Urine preg positive.   Quant HCG > 34K f        Evalina Field, [place] 04/29/12 1612

## 2012-04-29 NOTE — ED Notes (Signed)
Reports onset this morning of lower abd cramping; states had one episode of bright red blood with wiping, but has seen no blood since that time.  Also c/o cough x 5 days.

## 2012-05-01 NOTE — ED Provider Notes (Signed)
Medical screening examination/treatment/procedure(s) were performed by non-physician practitioner and as supervising physician I was immediately available for consultation/collaboration.   Antoniette Peake, MD 05/01/12 0707 

## 2012-05-08 LAB — OB RESULTS CONSOLE HIV ANTIBODY (ROUTINE TESTING): HIV: NONREACTIVE

## 2012-05-08 LAB — OB RESULTS CONSOLE RUBELLA ANTIBODY, IGM: Rubella: IMMUNE

## 2012-05-08 LAB — OB RESULTS CONSOLE VARICELLA ZOSTER ANTIBODY, IGG: Varicella: IMMUNE

## 2012-05-22 ENCOUNTER — Other Ambulatory Visit: Payer: Self-pay | Admitting: Adult Health

## 2012-05-22 ENCOUNTER — Other Ambulatory Visit (HOSPITAL_COMMUNITY)
Admission: RE | Admit: 2012-05-22 | Discharge: 2012-05-22 | Disposition: A | Discharge status: Home / Self Care | Payer: Medicaid Other | Source: Ambulatory Visit | Attending: Obstetrics and Gynecology | Admitting: Obstetrics and Gynecology

## 2012-05-22 DIAGNOSIS — Z01419 Encounter for gynecological examination (general) (routine) without abnormal findings: Secondary | ICD-10-CM | POA: Insufficient documentation

## 2012-07-31 ENCOUNTER — Emergency Department (HOSPITAL_COMMUNITY)
Admission: EM | Admit: 2012-07-31 | Discharge: 2012-08-01 | Disposition: A | Discharge status: Home / Self Care | Payer: Medicaid Other | Attending: Emergency Medicine | Admitting: Emergency Medicine

## 2012-07-31 ENCOUNTER — Encounter (HOSPITAL_COMMUNITY): Payer: Self-pay | Admitting: Emergency Medicine

## 2012-07-31 DIAGNOSIS — R51 Headache: Secondary | ICD-10-CM | POA: Insufficient documentation

## 2012-07-31 DIAGNOSIS — R109 Unspecified abdominal pain: Secondary | ICD-10-CM | POA: Insufficient documentation

## 2012-07-31 DIAGNOSIS — O99330 Smoking (tobacco) complicating pregnancy, unspecified trimester: Secondary | ICD-10-CM | POA: Insufficient documentation

## 2012-07-31 DIAGNOSIS — Z792 Long term (current) use of antibiotics: Secondary | ICD-10-CM | POA: Insufficient documentation

## 2012-07-31 DIAGNOSIS — Z8659 Personal history of other mental and behavioral disorders: Secondary | ICD-10-CM | POA: Insufficient documentation

## 2012-07-31 DIAGNOSIS — O9989 Other specified diseases and conditions complicating pregnancy, childbirth and the puerperium: Secondary | ICD-10-CM | POA: Insufficient documentation

## 2012-07-31 DIAGNOSIS — R42 Dizziness and giddiness: Secondary | ICD-10-CM

## 2012-07-31 DIAGNOSIS — J3489 Other specified disorders of nose and nasal sinuses: Secondary | ICD-10-CM | POA: Insufficient documentation

## 2012-07-31 DIAGNOSIS — Z8619 Personal history of other infectious and parasitic diseases: Secondary | ICD-10-CM | POA: Insufficient documentation

## 2012-07-31 NOTE — ED Notes (Signed)
Pt c/o nasal congestion. States when she sneezes or blows her nose she gets dizzy.  C/o abdominal pain upper midline x 3 days. States that along her surgical scar from her gallbladder removal it feels "like its coming open".

## 2012-08-01 ENCOUNTER — Encounter (HOSPITAL_COMMUNITY): Payer: Self-pay | Admitting: *Deleted

## 2012-08-01 MED ORDER — ACETAMINOPHEN 325 MG PO TABS
650.0000 mg | ORAL_TABLET | Freq: Once | ORAL | Status: AC
  Administered 2012-08-01: 650 mg via ORAL
  Filled 2012-08-01: qty 2

## 2012-08-01 MED ORDER — SODIUM CHLORIDE 0.9 % IV BOLUS (SEPSIS)
1000.0000 mL | Freq: Once | INTRAVENOUS | Status: AC
  Administered 2012-08-01: 1000 mL via INTRAVENOUS

## 2012-08-01 NOTE — ED Notes (Signed)
Fetal monitor removed per Dr. Velia Meyer instructions

## 2012-08-01 NOTE — ED Notes (Signed)
Pt requesting Tylenol for headache. Dr. Colon Branch notified.

## 2012-08-01 NOTE — Progress Notes (Signed)
Call from [facility], pt presenting with nasal congestion and abd pain x 3 days. FHR monitors placed by [facility]

## 2012-08-01 NOTE — ED Provider Notes (Signed)
History     CSN: [id number]  Arrival date & time 07/31/12  2341   First MD Initiated Contact with Patient 08/01/12 0012      Chief Complaint  Patient presents with  . Abdominal Pain  . Nasal Congestion    (Consider location/radiation/quality/duration/timing/severity/associated sxs/prior treatment) HPI Pamela [name] is a 27 y.o. female , G3 P1, A1, 6 months pregnant  who presents to the Emergency Department complaining of  Dizziness when she sneezes and blows her nose. She also has tenderness to the top of her abdomen and tenderness to the umbilicus.  OB/GYN Dr. Emelda Fear  Past Medical History  Diagnosis Date  . Genital herpes   . Anxiety   . Tobacco abuse 01/10/2012    Past Surgical History  Procedure Laterality Date  . Tonsillectomy    . Cesarean section  03/04/2011    Procedure: CESAREAN SECTION;  Surgeon: Lesly Dukes, MD;  Location: [facility];  Service: Gynecology;  Laterality: N/A;  primary of baby  boy at 34  . Cholecystectomy  01/10/2012    Procedure: LAPAROSCOPIC CHOLECYSTECTOMY;  Surgeon: Dalia Heading, MD;  Location: [facility];  Service: General;  Laterality: N/A;  attempted Intraopertive cholangiogram    Family History  Problem Relation Age of Onset  . Nephrolithiasis Father   . Graves' disease Father     History  Substance Use Topics  . Smoking status: Current Every Day Smoker -- 0.25 packs/day for 5 years    Types: Cigarettes  . Smokeless tobacco: Never Used  . Alcohol Use: No    OB History   Grav Para Term Preterm Abortions TAB SAB Ect Mult Living   3 1 1  0 1 0 1 0 0 1      Review of Systems  Constitutional: Negative for fever.       10 Systems reviewed and are negative for acute change except as noted in the HPI.  HENT: Negative for congestion.   Eyes: Negative for discharge and redness.  Respiratory: Negative for cough and shortness of breath.   Cardiovascular: Negative for chest pain.  Gastrointestinal: Negative for vomiting and abdominal  pain.       Pain tot he top of abdomen and to umbilicus  Musculoskeletal: Negative for back pain.  Skin: Negative for rash.  Neurological: Positive for headaches. Negative for syncope and numbness.  Psychiatric/Behavioral:       No behavior change.    Allergies  Ceftriaxone and Latex  Home Medications   Current Outpatient Rx  Name  Route  Sig  Dispense  Refill  . Prenatal Vit-Fe Fumarate-FA (PRENATAL MULTIVITAMIN) TABS   Oral   Take 1 tablet by mouth daily at 12 noon.         Marland Kitchen azithromycin (ZITHROMAX Z-PAK) 250 MG tablet      One tab po QD (initial dose given in ED)   4 tablet   0     BP 114/62  Pulse 109  Temp(Src) 98.2 F (36.8 C) (Oral)  Resp 14  SpO2 98%  LMP 02/04/2012  Physical Exam  Nursing note and vitals reviewed. Constitutional: She appears well-developed and well-nourished.  Awake, alert, nontoxic appearance.  HENT:  Head: Atraumatic.  Right Ear: External ear normal.  Left Ear: External ear normal.  Mouth/Throat: Oropharynx is clear and moist.  Neck: Neck supple.  Cardiovascular: Normal rate.   Pulmonary/Chest: Effort normal. She exhibits no tenderness.  Abdominal: Soft. Bowel sounds are normal. There is no tenderness. There is no rebound.  Mild tenderness to epigastric region. Umbilicus is normal.  Genitourinary:  gravid  Musculoskeletal: She exhibits no tenderness.  Baseline ROM, no obvious new focal weakness.  Neurological:  Mental status and motor strength appears baseline for patient and situation.  Skin: No rash noted.  Psychiatric: She has a normal mood and affect.    ED Course  Procedures (including critical care time)    0218 Patient received IVF, tylenol. TOCO showed normal FHR 151.   MDM  Patient with abdominal pain, unbilicus pain, pregnant, dizziness. Given IVF, PO fluids, tylenol with relief.  Pt feels improved after observation and/or treatment in ED.Pt stable in ED with no significant deterioration in condition.The  patient appears reasonably screened and/or stabilized for discharge and I doubt any other medical condition or other [facility] requiring further screening, evaluation, or treatment in the ED at this time prior to discharge.  MDM Reviewed: nursing note and vitals           Nicoletta Dress. Colon Branch, MD 08/01/12 [phone]

## 2012-08-05 LAB — OB RESULTS CONSOLE RPR: RPR: NONREACTIVE

## 2012-08-08 ENCOUNTER — Emergency Department (HOSPITAL_COMMUNITY)
Admission: EM | Admit: 2012-08-08 | Discharge: 2012-08-09 | Disposition: A | Discharge status: Home / Self Care | Payer: Medicaid Other | Attending: Emergency Medicine | Admitting: Emergency Medicine

## 2012-08-08 ENCOUNTER — Encounter (HOSPITAL_COMMUNITY): Payer: Self-pay

## 2012-08-08 DIAGNOSIS — G8918 Other acute postprocedural pain: Secondary | ICD-10-CM | POA: Insufficient documentation

## 2012-08-08 DIAGNOSIS — T85848A Pain due to other internal prosthetic devices, implants and grafts, initial encounter: Secondary | ICD-10-CM

## 2012-08-08 DIAGNOSIS — Z8619 Personal history of other infectious and parasitic diseases: Secondary | ICD-10-CM | POA: Insufficient documentation

## 2012-08-08 DIAGNOSIS — K089 Disorder of teeth and supporting structures, unspecified: Secondary | ICD-10-CM | POA: Insufficient documentation

## 2012-08-08 DIAGNOSIS — R220 Localized swelling, mass and lump, head: Secondary | ICD-10-CM | POA: Insufficient documentation

## 2012-08-08 DIAGNOSIS — Z8659 Personal history of other mental and behavioral disorders: Secondary | ICD-10-CM | POA: Insufficient documentation

## 2012-08-08 DIAGNOSIS — O99330 Smoking (tobacco) complicating pregnancy, unspecified trimester: Secondary | ICD-10-CM | POA: Insufficient documentation

## 2012-08-08 DIAGNOSIS — O9989 Other specified diseases and conditions complicating pregnancy, childbirth and the puerperium: Secondary | ICD-10-CM | POA: Insufficient documentation

## 2012-08-08 DIAGNOSIS — R221 Localized swelling, mass and lump, neck: Secondary | ICD-10-CM | POA: Insufficient documentation

## 2012-08-08 HISTORY — DX: Encounter for supervision of normal pregnancy, unspecified, unspecified trimester: Z34.90

## 2012-08-08 MED ORDER — BUPIVACAINE HCL (PF) 0.5 % IJ SOLN
1.8000 mL | Freq: Once | INTRAMUSCULAR | Status: AC
  Administered 2012-08-09: 1.8 mL

## 2012-08-08 MED ORDER — ACETAMINOPHEN 500 MG PO TABS
1000.0000 mg | ORAL_TABLET | Freq: Once | ORAL | Status: AC
  Administered 2012-08-08: 1000 mg via ORAL
  Filled 2012-08-08: qty 2

## 2012-08-08 NOTE — ED Notes (Signed)
Pt is approx [redacted] weeks pregnant, states she has a toothache for several days,

## 2012-08-08 NOTE — ED Notes (Signed)
Patient complaining of right side toothache since yesterday. Reports is causing headache and visual disturbance as well. Patient is [redacted] weeks pregnant. Denies problems with pregnancy.

## 2012-08-08 NOTE — ED Notes (Signed)
Patient came out of room asking about the wait time to see a doctor and get something for pain. Delay explained to patient.

## 2012-08-08 NOTE — ED Provider Notes (Signed)
History  This chart was scribed for Sunnie Nielsen, MD by Erskine Emery, ED Scribe. This patient was seen in room [facility] and the patient's care was started at 23:01.   CSN: [id number]  Arrival date & time 08/08/12  2032   First MD Initiated Contact with Patient 08/08/12 2301      Chief Complaint  Patient presents with  . Dental Pain  . 27 wks preg    (Consider location/radiation/quality/duration/timing/severity/associated sxs/prior treatment) The history is provided by the patient. No language interpreter was used.  Pamela [name] is a 27 y.o. female who presents to the Emergency Department complaining of a gradually worsening right-sided lower dental pain for the past several days. Pt reports she had an tooth pulled the last time she was pregnant and now is having pain in that area. Pt reports some associated right-sided facial swelling and jaw line pain, but denies any vaginal bleeding, contractions, back pain, or abdominal pain. Pain is sharp and mod in severity - hurts to chew Pt is 6 months pregnant now.   Past Medical History  Diagnosis Date  . Genital herpes   . Anxiety   . Tobacco abuse 01/10/2012  . Pregnant     Past Surgical History  Procedure Laterality Date  . Tonsillectomy    . Cesarean section  03/04/2011    Procedure: CESAREAN SECTION;  Surgeon: Lesly Dukes, MD;  Location: [facility];  Service: Gynecology;  Laterality: N/A;  primary of baby  boy at 26  . Cholecystectomy  01/10/2012    Procedure: LAPAROSCOPIC CHOLECYSTECTOMY;  Surgeon: Dalia Heading, MD;  Location: [facility];  Service: General;  Laterality: N/A;  attempted Intraopertive cholangiogram    Family History  Problem Relation Age of Onset  . Nephrolithiasis Father   . Graves' disease Father     History  Substance Use Topics  . Smoking status: Current Every Day Smoker -- 0.25 packs/day for 5 years    Types: Cigarettes  . Smokeless tobacco: Never Used  . Alcohol Use: No    OB History   Grav Para  Term Preterm Abortions TAB SAB Ect Mult Living   3 1 1  0 1 0 1 0 0 1      Review of Systems A complete 10 system review of systems was obtained and all systems are negative except as noted in the HPI and PMH.    Allergies  Ceftriaxone and Latex  Home Medications   Current Outpatient Rx  Name  Route  Sig  Dispense  Refill  . ibuprofen (ADVIL,MOTRIN) 200 MG tablet   Oral   Take 400 mg by mouth once as needed for pain.         . Prenatal Vit-Fe Fumarate-FA (PRENATAL MULTIVITAMIN) TABS   Oral   Take 1 tablet by mouth daily at 12 noon.           BP 120/55  Pulse 100  Temp(Src) 98.2 F (36.8 C) (Oral)  Resp 20  Ht 5\' 10"  (1.778 m)  Wt 304 lb (137.893 kg)  BMI 43.62 kg/m2  SpO2 100%  LMP 02/04/2012  Physical Exam  Nursing note and vitals reviewed. Constitutional: She is oriented to person, place, and time. She appears well-developed and well-nourished. No distress.  HENT:  Head: Normocephalic and atraumatic.  Upper teeth dentition nontender. Tenderness over right molar. No gingival erythema. No facial swelling or erythema, no trismus  Eyes: EOM are normal. Pupils are equal, round, and reactive to light.  Neck: Neck supple.  No tracheal deviation present.  Cardiovascular: Normal rate.   Pulmonary/Chest: Effort normal. No respiratory distress.  Abdominal: Soft. She exhibits no distension. There is no tenderness.  Gravid abdomen, consistent with dates.  Musculoskeletal: Normal range of motion. She exhibits no edema.  Neurological: She is alert and oriented to person, place, and time.  Skin: Skin is warm and dry.  Psychiatric: She has a normal mood and affect.    ED Course  Dental Date/Time: 08/09/2012 1:27 AM Performed by: Sunnie Nielsen Authorized by: Sunnie Nielsen Consent: Verbal consent obtained. Risks and benefits: risks, benefits and alternatives were discussed Consent given by: patient Patient understanding: patient states understanding of the procedure being  performed Patient consent: the patient's understanding of the procedure matches consent given Procedure consent: procedure consent matches procedure scheduled Required items: required blood products, implants, devices, and special equipment available Patient identity confirmed: verbally with patient Time out: Immediately prior to procedure a "time out" was called to verify the correct patient, procedure, equipment, support staff and site/side marked as required. Preparation: Patient was prepped and draped in the usual sterile fashion. Local anesthesia used: yes Anesthesia: local infiltration Local anesthetic: bupivacaine 0.5% without epinephrine Anesthetic total: 1.8 ml Patient tolerance: Patient tolerated the procedure well with no immediate complications. Comments: 27G needle used to inject locally R lower first molar, pain improved   (including critical care time) DIAGNOSTIC STUDIES: Oxygen Saturation is 100% on room air, normal by my interpretation.    COORDINATION OF CARE: 23:42--I evaluated the patient and we discussed a treatment plan including Tylenol and antibiotics to which the pt agreed.     MDM  Dental pain, 6 months pregnant, given tylenol and local dental block with pain improved, ABx provided - is allergic to C3 but has documented PCN OK. Rx PCN, plan f/u OB and DDS.   VS and nursing notes reviewed    I personally performed the services described in this documentation, which was scribed in my presence. The recorded information has been reviewed and is accurate.     Sunnie Nielsen, MD 08/09/12 0130

## 2012-08-09 MED ORDER — PENICILLIN V POTASSIUM 500 MG PO TABS: 500.0000 mg | ORAL_TABLET | Freq: Four times a day (QID) | ORAL | Status: AC

## 2012-08-09 MED ORDER — PENICILLIN V POTASSIUM 250 MG PO TABS
500.0000 mg | ORAL_TABLET | Freq: Once | ORAL | Status: AC
  Administered 2012-08-09: 500 mg via ORAL
  Filled 2012-08-09: qty 2

## 2012-08-11 ENCOUNTER — Encounter: Payer: Self-pay | Admitting: *Deleted

## 2012-08-11 DIAGNOSIS — Z8619 Personal history of other infectious and parasitic diseases: Secondary | ICD-10-CM | POA: Insufficient documentation

## 2012-08-11 DIAGNOSIS — B009 Herpesviral infection, unspecified: Secondary | ICD-10-CM | POA: Insufficient documentation

## 2012-08-11 DIAGNOSIS — Z87898 Personal history of other specified conditions: Secondary | ICD-10-CM

## 2012-08-11 DIAGNOSIS — O98519 Other viral diseases complicating pregnancy, unspecified trimester: Secondary | ICD-10-CM

## 2012-08-26 ENCOUNTER — Encounter: Payer: Self-pay | Admitting: Advanced Practice Midwife

## 2012-09-02 ENCOUNTER — Ambulatory Visit (INDEPENDENT_AMBULATORY_CARE_PROVIDER_SITE_OTHER): Payer: Medicaid Other | Admitting: Obstetrics and Gynecology

## 2012-09-02 VITALS — BP 128/70 | Wt 312.0 lb

## 2012-09-02 DIAGNOSIS — O34219 Maternal care for unspecified type scar from previous cesarean delivery: Secondary | ICD-10-CM

## 2012-09-02 DIAGNOSIS — O98519 Other viral diseases complicating pregnancy, unspecified trimester: Secondary | ICD-10-CM

## 2012-09-02 DIAGNOSIS — O09299 Supervision of pregnancy with other poor reproductive or obstetric history, unspecified trimester: Secondary | ICD-10-CM

## 2012-09-02 DIAGNOSIS — Z98891 History of uterine scar from previous surgery: Secondary | ICD-10-CM

## 2012-09-02 DIAGNOSIS — Z331 Pregnant state, incidental: Secondary | ICD-10-CM

## 2012-09-02 DIAGNOSIS — E669 Obesity, unspecified: Secondary | ICD-10-CM

## 2012-09-02 DIAGNOSIS — Z3490 Encounter for supervision of normal pregnancy, unspecified, unspecified trimester: Secondary | ICD-10-CM

## 2012-09-02 DIAGNOSIS — Z8619 Personal history of other infectious and parasitic diseases: Secondary | ICD-10-CM

## 2012-09-02 DIAGNOSIS — O99330 Smoking (tobacco) complicating pregnancy, unspecified trimester: Secondary | ICD-10-CM

## 2012-09-02 DIAGNOSIS — Z1389 Encounter for screening for other disorder: Secondary | ICD-10-CM

## 2012-09-02 LAB — POCT URINALYSIS DIPSTICK
Blood, UA: NEGATIVE
Glucose, UA: NEGATIVE

## 2012-09-02 MED ORDER — ACYCLOVIR 200 MG PO CAPS: 400.0000 mg | ORAL_CAPSULE | Freq: Two times a day (BID) | ORAL | Status: DC

## 2012-09-02 NOTE — Progress Notes (Signed)
Vaginal pressure

## 2012-09-02 NOTE — Patient Instructions (Signed)
Begin acyclovir at 34 weeks

## 2012-09-02 NOTE — Progress Notes (Signed)
104w2d, Prior cesarean, desires TOLAC. To sign consent today No c/o

## 2012-09-17 ENCOUNTER — Ambulatory Visit (INDEPENDENT_AMBULATORY_CARE_PROVIDER_SITE_OTHER): Payer: Medicaid Other | Admitting: Obstetrics and Gynecology

## 2012-09-17 VITALS — BP 138/62 | Wt 313.6 lb

## 2012-09-17 DIAGNOSIS — B009 Herpesviral infection, unspecified: Secondary | ICD-10-CM

## 2012-09-17 DIAGNOSIS — Z331 Pregnant state, incidental: Secondary | ICD-10-CM

## 2012-09-17 DIAGNOSIS — O09299 Supervision of pregnancy with other poor reproductive or obstetric history, unspecified trimester: Secondary | ICD-10-CM

## 2012-09-17 DIAGNOSIS — Z3480 Encounter for supervision of other normal pregnancy, unspecified trimester: Secondary | ICD-10-CM

## 2012-09-17 DIAGNOSIS — O98519 Other viral diseases complicating pregnancy, unspecified trimester: Secondary | ICD-10-CM

## 2012-09-17 DIAGNOSIS — Z1389 Encounter for screening for other disorder: Secondary | ICD-10-CM

## 2012-09-17 DIAGNOSIS — K859 Acute pancreatitis without necrosis or infection, unspecified: Secondary | ICD-10-CM

## 2012-09-17 DIAGNOSIS — O99210 Obesity complicating pregnancy, unspecified trimester: Secondary | ICD-10-CM

## 2012-09-17 DIAGNOSIS — O34219 Maternal care for unspecified type scar from previous cesarean delivery: Secondary | ICD-10-CM | POA: Insufficient documentation

## 2012-09-17 LAB — POCT URINALYSIS DIPSTICK
Glucose, UA: NEGATIVE
Ketones, UA: NEGATIVE

## 2012-09-17 NOTE — Patient Instructions (Signed)
Pick up acyclovir rx at pharmacy Discuss trial of labor request further at 37-38 weeks with providers.

## 2012-09-17 NOTE — Progress Notes (Signed)
C/o"vaginal pressure" 

## 2012-09-17 NOTE — Progress Notes (Signed)
TOLAC desired, discussed in detail including risks up to including uterine rupture and fetal damage or death.  Pt desires TOLAC OPTION, Consent signed. Due to obesity, and ballottable presenting part on today's exam, will plan to u/s for EFW at 37-38 wk, do internal exam to assess descent of vertex and pelvis, and discuss further. Pt may reconsider TOLAC if that eval unfavorable. jvf

## 2012-10-01 ENCOUNTER — Encounter: Payer: Self-pay | Admitting: Obstetrics & Gynecology

## 2012-10-01 ENCOUNTER — Ambulatory Visit (INDEPENDENT_AMBULATORY_CARE_PROVIDER_SITE_OTHER): Payer: Medicaid Other | Admitting: Obstetrics & Gynecology

## 2012-10-01 VITALS — BP 100/80 | Wt 310.5 lb

## 2012-10-01 DIAGNOSIS — O34219 Maternal care for unspecified type scar from previous cesarean delivery: Secondary | ICD-10-CM

## 2012-10-01 DIAGNOSIS — O09299 Supervision of pregnancy with other poor reproductive or obstetric history, unspecified trimester: Secondary | ICD-10-CM

## 2012-10-01 DIAGNOSIS — Z331 Pregnant state, incidental: Secondary | ICD-10-CM

## 2012-10-01 DIAGNOSIS — O0993 Supervision of high risk pregnancy, unspecified, third trimester: Secondary | ICD-10-CM

## 2012-10-01 DIAGNOSIS — O98519 Other viral diseases complicating pregnancy, unspecified trimester: Secondary | ICD-10-CM

## 2012-10-01 DIAGNOSIS — O0990 Supervision of high risk pregnancy, unspecified, unspecified trimester: Secondary | ICD-10-CM | POA: Insufficient documentation

## 2012-10-01 DIAGNOSIS — O99210 Obesity complicating pregnancy, unspecified trimester: Secondary | ICD-10-CM

## 2012-10-01 DIAGNOSIS — Z1389 Encounter for screening for other disorder: Secondary | ICD-10-CM

## 2012-10-01 LAB — POCT URINALYSIS DIPSTICK
Ketones, UA: NEGATIVE
Leukocytes, UA: NEGATIVE
Protein, UA: NEGATIVE

## 2012-10-01 NOTE — Progress Notes (Signed)
Pressure in vaginal area and cramping.

## 2012-10-01 NOTE — Patient Instructions (Signed)
Breastfeeding Deciding to breastfeed is one of the best choices you can make for you and your baby. The information that follows gives a brief overview of the benefits of breastfeeding as well as common topics surrounding breastfeeding. BENEFITS OF BREASTFEEDING For the baby  The first milk (colostrum) helps the baby's digestive system function better.   There are antibodies in the mother's milk that help the baby fight off infections.   The baby has a lower incidence of asthma, allergies, and sudden infant death syndrome (SIDS).   The nutrients in breast milk are better for the baby than infant formulas, and breast milk helps the baby's brain grow better.   Babies who breastfeed have less gas, colic, and constipation.  For the mother  Breastfeeding helps develop a very special bond between the mother and her baby.   Breastfeeding is convenient, always available at the correct temperature, and costs nothing.   Breastfeeding burns calories in the mother and helps her lose weight that was gained during pregnancy.   Breastfeeding makes the uterus contract back down to normal size faster and slows bleeding following delivery.   Breastfeeding mothers have a lower risk of developing breast cancer.  BREASTFEEDING FREQUENCY  A healthy, full-term baby may breastfeed as often as every hour or space his or her feedings to every 3 hours.   Watch your baby for signs of hunger. Nurse your baby if he or she shows signs of hunger. How often you nurse will vary from baby to baby.   Nurse as often as the baby requests, or when you feel the need to reduce the fullness of your breasts.   Awaken the baby if it has been 3 4 hours since the last feeding.   Frequent feeding will help the mother make more milk and will help prevent problems, such as sore nipples and engorgement of the breasts.  BABY'S POSITION AT THE BREAST  Whether lying down or sitting, be sure that the baby's tummy is  facing your tummy.   Support the breast with 4 fingers underneath the breast and the thumb above. Make sure your fingers are well away from the nipple and baby's mouth.   Stroke the baby's lips gently with your finger or nipple.   When the baby's mouth is open wide enough, place all of your nipple and as much of the areola as possible into your baby's mouth.   Pull the baby in close so the tip of the nose and the baby's cheeks touch the breast during the feeding.  FEEDINGS AND SUCTION  The length of each feeding varies from baby to baby and from feeding to feeding.   The baby must suck about 2 3 minutes for your milk to get to him or her. This is called a "let down." For this reason, allow the baby to feed on each breast as long as he or she wants. Your baby will end the feeding when he or she has received the right balance of nutrients.   To break the suction, put your finger into the corner of the baby's mouth and slide it between his or her gums before removing your breast from his or her mouth. This will help prevent sore nipples.  HOW TO TELL WHETHER YOUR BABY IS GETTING ENOUGH BREAST MILK. Wondering whether or not your baby is getting enough milk is a common concern among mothers. You can be assured that your baby is getting enough milk if:   Your baby is actively  sucking and you hear swallowing.   Your baby seems relaxed and satisfied after a feeding.   Your baby nurses at least 8 12 times in a 24 hour time period. Nurse your baby until he or she unlatches or falls asleep at the first breast (at least 10 20 minutes), then offer the second side.   Your baby is wetting 5 6 disposable diapers (6 8 cloth diapers) in a 24 hour period by 55 40 days of age.   Your baby is having at least 3 4 stools every 24 hours for the first 6 weeks. The stool should be soft and yellow.   Your baby should gain 4 7 ounces per week after he or she is 67 days old.   Your breasts feel softer  after nursing.  REDUCING BREAST ENGORGEMENT  In the first week after your baby is born, you may experience signs of breast engorgement. When breasts are engorged, they feel heavy, warm, full, and may be tender to the touch. You can reduce engorgement if you:   Nurse frequently, every 2 3 hours. Mothers who breastfeed early and often have fewer problems with engorgement.   Place light ice packs on your breasts for 10 20 minutes between feedings. This reduces swelling. Wrap the ice packs in a lightweight towel to protect your skin. Bags of frozen vegetables work well for this purpose.   Take a warm shower or apply warm, moist heat to your breast for 5 10 minutes just before each feeding. This increases circulation and helps the milk flow.   Gently massage your breast before and during the feeding. Using your finger tips, massage from the chest wall towards your nipple in a circular motion.   Make sure that the baby empties at least one breast at every feeding before switching sides.   Use a breast pump to empty the breasts if your baby is sleepy or not nursing well. You may also want to pump if you are returning to work oryou feel you are getting engorged.   Avoid bottle feeds, pacifiers, or supplemental feedings of water or juice in place of breastfeeding. Breast milk is all the food your baby needs. It is not necessary for your baby to have water or formula. In fact, to help your breasts make more milk, it is best not to give your baby supplemental feedings during the early weeks.   Be sure the baby is latched on and positioned properly while breastfeeding.   Wear a supportive bra, avoiding underwire styles.   Eat a balanced diet with enough fluids.   Rest often, relax, and take your prenatal vitamins to prevent fatigue, stress, and anemia.  If you follow these suggestions, your engorgement should improve in 24 48 hours. If you are still experiencing difficulty, call your  lactation consultant or caregiver.  CARING FOR YOURSELF Take care of your breasts  Bathe or shower daily.   Avoid using soap on your nipples.   Start feedings on your left breast at one feeding and on your right breast at the next feeding.   You will notice an increase in your milk supply 2 5 days after delivery. You may feel some discomfort from engorgement, which makes your breasts very firm and often tender. Engorgement "peaks" out within 24 48 hours. In the meantime, apply warm moist towels to your breasts for 5 10 minutes before feeding. Gentle massage and expression of some milk before feeding will soften your breasts, making it easier for your  baby to latch on.   Wear a well-fitting nursing bra, and air dry your nipples for a 3 after each feeding.   Only use cotton bra pads.   Only use pure lanolin on your nipples after nursing. You do not need to wash it off before feeding the baby again. Another option is to express a few drops of breast milk and gently massage it into your nipples.  Take care of yourself  Eat well-balanced meals and nutritious snacks.   Drinking milk, fruit juice, and water to satisfy your thirst (about 8 glasses a day).   Get plenty of rest.  Avoid foods that you notice affect the baby in a bad way.  SEEK MEDICAL CARE IF:   You have difficulty with breastfeeding and need help.   You have a hard, red, sore area on your breast that is accompanied by a fever.   Your baby is too sleepy to eat well or is having trouble sleeping.   Your baby is wetting less than 6 diapers a day, by 83 days of age.   Your baby's skin or white part of his or her eyes is more yellow than it was in the hospital.   You feel depressed.  Document Released: 05/20/2005 Document Revised: 11/19/2011 Document Reviewed: 08/18/2011 [facility] Patient Information 2013 [place], [place].

## 2012-10-01 NOTE — Progress Notes (Signed)
BP weight and urine results all reviewed and noted. Patient reports good fetal movement, denies any bleeding and no rupture of membranes symptoms or regular contractions. Patient is without complaints. All questions were answered. Patient confirms she wants a TOL

## 2012-10-08 ENCOUNTER — Ambulatory Visit (INDEPENDENT_AMBULATORY_CARE_PROVIDER_SITE_OTHER): Payer: Medicaid Other | Admitting: Obstetrics and Gynecology

## 2012-10-08 VITALS — BP 114/66 | Wt 313.6 lb

## 2012-10-08 DIAGNOSIS — Z3480 Encounter for supervision of other normal pregnancy, unspecified trimester: Secondary | ICD-10-CM

## 2012-10-08 DIAGNOSIS — O34219 Maternal care for unspecified type scar from previous cesarean delivery: Secondary | ICD-10-CM

## 2012-10-08 DIAGNOSIS — O09299 Supervision of pregnancy with other poor reproductive or obstetric history, unspecified trimester: Secondary | ICD-10-CM

## 2012-10-08 DIAGNOSIS — Z331 Pregnant state, incidental: Secondary | ICD-10-CM

## 2012-10-08 DIAGNOSIS — Z1389 Encounter for screening for other disorder: Secondary | ICD-10-CM

## 2012-10-08 DIAGNOSIS — L0231 Cutaneous abscess of buttock: Secondary | ICD-10-CM

## 2012-10-08 DIAGNOSIS — O98519 Other viral diseases complicating pregnancy, unspecified trimester: Secondary | ICD-10-CM

## 2012-10-08 DIAGNOSIS — E669 Obesity, unspecified: Secondary | ICD-10-CM

## 2012-10-08 LAB — POCT URINALYSIS DIPSTICK
Leukocytes, UA: NEGATIVE
Nitrite, UA: NEGATIVE
Protein, UA: NEGATIVE

## 2012-10-08 MED ORDER — SULFAMETHOXAZOLE-TRIMETHOPRIM 400-80 MG PO TABS: 1.0000 | ORAL_TABLET | Freq: Two times a day (BID) | ORAL | Status: DC

## 2012-10-08 NOTE — Progress Notes (Signed)
1-Pt states had a thick, clear discharge only one time. No evidence of srom 2-VBAC candidate:  Pt apprehensive, Vertex out of pelvis, ballottable PLAN: discuss further after 38 wk 3-GC/CHL/GBS collected 4-left Gluteal abscess, s/p I&D in [facility] clinic Rx refilled for Septra DS x 1 wk

## 2012-10-10 LAB — STREP B DNA PROBE: GBSP: NEGATIVE

## 2012-10-15 ENCOUNTER — Ambulatory Visit (INDEPENDENT_AMBULATORY_CARE_PROVIDER_SITE_OTHER): Payer: Medicaid Other | Admitting: Obstetrics and Gynecology

## 2012-10-15 VITALS — BP 138/64 | Wt 321.0 lb

## 2012-10-15 DIAGNOSIS — O09299 Supervision of pregnancy with other poor reproductive or obstetric history, unspecified trimester: Secondary | ICD-10-CM

## 2012-10-15 DIAGNOSIS — Z1389 Encounter for screening for other disorder: Secondary | ICD-10-CM

## 2012-10-15 DIAGNOSIS — E669 Obesity, unspecified: Secondary | ICD-10-CM

## 2012-10-15 DIAGNOSIS — Z3403 Encounter for supervision of normal first pregnancy, third trimester: Secondary | ICD-10-CM

## 2012-10-15 DIAGNOSIS — Z331 Pregnant state, incidental: Secondary | ICD-10-CM

## 2012-10-15 DIAGNOSIS — O98519 Other viral diseases complicating pregnancy, unspecified trimester: Secondary | ICD-10-CM

## 2012-10-15 DIAGNOSIS — O34219 Maternal care for unspecified type scar from previous cesarean delivery: Secondary | ICD-10-CM

## 2012-10-15 LAB — POCT URINALYSIS DIPSTICK
Glucose, UA: NEGATIVE
Ketones, UA: NEGATIVE
Leukocytes, UA: NEGATIVE
Nitrite, UA: NEGATIVE

## 2012-10-15 NOTE — Patient Instructions (Signed)
Cervix exam next week

## 2012-10-15 NOTE — Progress Notes (Signed)
1. VBAC candidate: pt inclined to have c/s unless vertex very low next wk. Currently vtx out of pelvis GBS NEG

## 2012-10-16 ENCOUNTER — Other Ambulatory Visit: Payer: Self-pay | Admitting: Obstetrics & Gynecology

## 2012-10-16 DIAGNOSIS — O10019 Pre-existing essential hypertension complicating pregnancy, unspecified trimester: Secondary | ICD-10-CM

## 2012-10-16 DIAGNOSIS — O3663X1 Maternal care for excessive fetal growth, third trimester, fetus 1: Secondary | ICD-10-CM

## 2012-10-22 ENCOUNTER — Other Ambulatory Visit: Payer: Medicaid Other

## 2012-10-23 ENCOUNTER — Ambulatory Visit (INDEPENDENT_AMBULATORY_CARE_PROVIDER_SITE_OTHER): Payer: Medicaid Other | Admitting: Obstetrics & Gynecology

## 2012-10-23 ENCOUNTER — Ambulatory Visit (INDEPENDENT_AMBULATORY_CARE_PROVIDER_SITE_OTHER): Payer: Medicaid Other

## 2012-10-23 ENCOUNTER — Encounter: Payer: Self-pay | Admitting: Obstetrics & Gynecology

## 2012-10-23 VITALS — BP 140/70 | Wt 324.0 lb

## 2012-10-23 DIAGNOSIS — O0993 Supervision of high risk pregnancy, unspecified, third trimester: Secondary | ICD-10-CM

## 2012-10-23 DIAGNOSIS — O3663X1 Maternal care for excessive fetal growth, third trimester, fetus 1: Secondary | ICD-10-CM

## 2012-10-23 DIAGNOSIS — E669 Obesity, unspecified: Secondary | ICD-10-CM

## 2012-10-23 DIAGNOSIS — O34219 Maternal care for unspecified type scar from previous cesarean delivery: Secondary | ICD-10-CM

## 2012-10-23 DIAGNOSIS — Z1389 Encounter for screening for other disorder: Secondary | ICD-10-CM

## 2012-10-23 DIAGNOSIS — O09299 Supervision of pregnancy with other poor reproductive or obstetric history, unspecified trimester: Secondary | ICD-10-CM

## 2012-10-23 DIAGNOSIS — Z331 Pregnant state, incidental: Secondary | ICD-10-CM

## 2012-10-23 DIAGNOSIS — O98519 Other viral diseases complicating pregnancy, unspecified trimester: Secondary | ICD-10-CM

## 2012-10-23 DIAGNOSIS — O3660X0 Maternal care for excessive fetal growth, unspecified trimester, not applicable or unspecified: Secondary | ICD-10-CM

## 2012-10-23 LAB — POCT URINALYSIS DIPSTICK
Blood, UA: NEGATIVE
Ketones, UA: NEGATIVE
Leukocytes, UA: NEGATIVE

## 2012-10-23 NOTE — Progress Notes (Signed)
EFW 81%tile cx not doing anything, plan if has not delivered by next week will schedule CS 6.4.2014 BP weight and urine results all reviewed and noted. Patient reports good fetal movement, denies any bleeding and no rupture of membranes symptoms or regular contractions. Patient is without complaints. All questions were answered.

## 2012-10-23 NOTE — Progress Notes (Signed)
PT having pain in back.

## 2012-10-23 NOTE — Progress Notes (Signed)
U/S(38+4wks)-vtx active fetus, EFW 8 lb 4 oz (81st%tile), fluid wnl AFI=17.2cm, female fetus, FHR=155BPM, ant gr 2 plac

## 2012-10-27 ENCOUNTER — Encounter (HOSPITAL_COMMUNITY): Payer: Self-pay | Admitting: Anesthesiology

## 2012-10-27 ENCOUNTER — Encounter (HOSPITAL_COMMUNITY): Payer: Self-pay | Admitting: *Deleted

## 2012-10-27 ENCOUNTER — Inpatient Hospital Stay (HOSPITAL_COMMUNITY)
Admission: AD | Admit: 2012-10-27 | Discharge: 2012-10-29 | DRG: 766 | Disposition: A | Discharge status: Home / Self Care | Payer: Medicaid Other | Source: Ambulatory Visit | Attending: Obstetrics & Gynecology | Admitting: Obstetrics & Gynecology

## 2012-10-27 ENCOUNTER — Inpatient Hospital Stay (HOSPITAL_COMMUNITY): Payer: Medicaid Other | Admitting: Anesthesiology

## 2012-10-27 ENCOUNTER — Encounter (HOSPITAL_COMMUNITY)
Admission: AD | Disposition: A | Discharge status: Home / Self Care | Payer: Self-pay | Source: Ambulatory Visit | Attending: Obstetrics & Gynecology

## 2012-10-27 DIAGNOSIS — E669 Obesity, unspecified: Secondary | ICD-10-CM | POA: Diagnosis present

## 2012-10-27 DIAGNOSIS — Z98891 History of uterine scar from previous surgery: Secondary | ICD-10-CM

## 2012-10-27 DIAGNOSIS — O99214 Obesity complicating childbirth: Secondary | ICD-10-CM | POA: Diagnosis present

## 2012-10-27 DIAGNOSIS — O34219 Maternal care for unspecified type scar from previous cesarean delivery: Principal | ICD-10-CM | POA: Diagnosis present

## 2012-10-27 LAB — CBC
HCT: 31 % — ABNORMAL LOW (ref 36.0–46.0)
Hemoglobin: 10.2 g/dL — ABNORMAL LOW (ref 12.0–15.0)
MCV: 83.1 fL (ref 78.0–100.0)
RBC: 3.73 MIL/uL — ABNORMAL LOW (ref 3.87–5.11)
WBC: 9.2 10*3/uL (ref 4.0–10.5)

## 2012-10-27 LAB — TYPE AND SCREEN
ABO/RH(D): O POS
Antibody Screen: NEGATIVE

## 2012-10-27 SURGERY — Surgical Case
Anesthesia: Spinal | Site: Abdomen | Wound class: Clean Contaminated

## 2012-10-27 MED ORDER — LANOLIN HYDROUS EX OINT: 1.0000 "application " | TOPICAL_OINTMENT | CUTANEOUS | Status: DC | PRN

## 2012-10-27 MED ORDER — OXYTOCIN 40 UNITS IN LACTATED RINGERS INFUSION - SIMPLE MED: 62.5000 mL/h | INTRAVENOUS | Status: AC

## 2012-10-27 MED ORDER — LACTATED RINGERS IV SOLN
INTRAVENOUS | Status: DC
  Administered 2012-10-27 – 2012-10-28 (×4): via INTRAVENOUS

## 2012-10-27 MED ORDER — BUPIVACAINE IN DEXTROSE 0.75-8.25 % IT SOLN
INTRATHECAL | Status: DC | PRN
  Administered 2012-10-27: 1.6 mL via INTRATHECAL

## 2012-10-27 MED ORDER — OXYCODONE-ACETAMINOPHEN 5-325 MG PO TABS
1.0000 | ORAL_TABLET | ORAL | Status: DC | PRN
  Administered 2012-10-28: 1 via ORAL
  Administered 2012-10-28: 2 via ORAL
  Administered 2012-10-28: 1 via ORAL
  Administered 2012-10-28: 2 via ORAL
  Administered 2012-10-29 (×2): 1 via ORAL
  Administered 2012-10-29: 2 via ORAL
  Administered 2012-10-29 (×2): 1 via ORAL
  Filled 2012-10-27: qty 2
  Filled 2012-10-27 (×2): qty 1
  Filled 2012-10-27: qty 2
  Filled 2012-10-27: qty 1
  Filled 2012-10-27: qty 2
  Filled 2012-10-27 (×3): qty 1

## 2012-10-27 MED ORDER — BUPIVACAINE HCL (PF) 0.5 % IJ SOLN
INTRAMUSCULAR | Status: DC | PRN
  Administered 2012-10-27: 30 mL

## 2012-10-27 MED ORDER — FENTANYL CITRATE 0.05 MG/ML IJ SOLN
INTRAMUSCULAR | Status: DC | PRN
  Administered 2012-10-27: 15 ug via INTRATHECAL

## 2012-10-27 MED ORDER — METOCLOPRAMIDE HCL 5 MG/ML IJ SOLN
INTRAMUSCULAR | Status: AC
  Filled 2012-10-27: qty 2

## 2012-10-27 MED ORDER — ONDANSETRON HCL 4 MG/2ML IJ SOLN
4.0000 mg | INTRAMUSCULAR | Status: DC | PRN
Start: 2012-10-27 — End: 2012-10-29

## 2012-10-27 MED ORDER — KETOROLAC TROMETHAMINE 60 MG/2ML IM SOLN: 60.0000 mg | Freq: Once | INTRAMUSCULAR | Status: AC | PRN

## 2012-10-27 MED ORDER — DIPHENHYDRAMINE HCL 50 MG/ML IJ SOLN: 12.5000 mg | INTRAMUSCULAR | Status: DC | PRN

## 2012-10-27 MED ORDER — SCOPOLAMINE 1 MG/3DAYS TD PT72: 1.0000 | MEDICATED_PATCH | Freq: Once | TRANSDERMAL | Status: DC

## 2012-10-27 MED ORDER — IBUPROFEN 600 MG PO TABS
600.0000 mg | ORAL_TABLET | Freq: Four times a day (QID) | ORAL | Status: DC
  Administered 2012-10-28 – 2012-10-29 (×7): 600 mg via ORAL
  Filled 2012-10-27 (×7): qty 1

## 2012-10-27 MED ORDER — TETANUS-DIPHTH-ACELL PERTUSSIS 5-2.5-18.5 LF-MCG/0.5 IM SUSP
0.5000 mL | Freq: Once | INTRAMUSCULAR | Status: AC
  Administered 2012-10-28: 0.5 mL via INTRAMUSCULAR

## 2012-10-27 MED ORDER — KETOROLAC TROMETHAMINE 60 MG/2ML IM SOLN
INTRAMUSCULAR | Status: AC
  Administered 2012-10-27: 60 mg via INTRAMUSCULAR
  Filled 2012-10-27: qty 2

## 2012-10-27 MED ORDER — LACTATED RINGERS IV SOLN: INTRAVENOUS | Status: DC

## 2012-10-27 MED ORDER — NALOXONE HCL 0.4 MG/ML IJ SOLN: 0.4000 mg | INTRAMUSCULAR | Status: DC | PRN

## 2012-10-27 MED ORDER — MEPERIDINE HCL 25 MG/ML IJ SOLN: 6.2500 mg | INTRAMUSCULAR | Status: DC | PRN

## 2012-10-27 MED ORDER — SCOPOLAMINE 1 MG/3DAYS TD PT72
MEDICATED_PATCH | TRANSDERMAL | Status: AC
  Administered 2012-10-27: 1.5 mg via TRANSDERMAL
  Filled 2012-10-27: qty 1

## 2012-10-27 MED ORDER — CIPROFLOXACIN IN D5W 400 MG/200ML IV SOLN: 400.0000 mg | INTRAVENOUS | Status: DC

## 2012-10-27 MED ORDER — NALOXONE HCL 1 MG/ML IJ SOLN
1.0000 ug/kg/h | INTRAVENOUS | Status: DC | PRN
  Filled 2012-10-27: qty 2

## 2012-10-27 MED ORDER — WITCH HAZEL-GLYCERIN EX PADS: 1.0000 "application " | MEDICATED_PAD | CUTANEOUS | Status: DC | PRN

## 2012-10-27 MED ORDER — DIBUCAINE 1 % RE OINT: 1.0000 "application " | TOPICAL_OINTMENT | RECTAL | Status: DC | PRN

## 2012-10-27 MED ORDER — DIPHENHYDRAMINE HCL 25 MG PO CAPS
25.0000 mg | ORAL_CAPSULE | Freq: Four times a day (QID) | ORAL | Status: DC | PRN
  Administered 2012-10-28: 25 mg via ORAL

## 2012-10-27 MED ORDER — 0.9 % SODIUM CHLORIDE (POUR BTL) OPTIME
TOPICAL | Status: DC | PRN
  Administered 2012-10-27: 1000 mL

## 2012-10-27 MED ORDER — CITRIC ACID-SODIUM CITRATE 334-500 MG/5ML PO SOLN
30.0000 mL | Freq: Once | ORAL | Status: AC
  Administered 2012-10-27: 30 mL via ORAL
  Filled 2012-10-27: qty 15

## 2012-10-27 MED ORDER — SIMETHICONE 80 MG PO CHEW: 80.0000 mg | CHEWABLE_TABLET | ORAL | Status: DC | PRN

## 2012-10-27 MED ORDER — ONDANSETRON HCL 4 MG PO TABS: 4.0000 mg | ORAL_TABLET | ORAL | Status: DC | PRN

## 2012-10-27 MED ORDER — NALBUPHINE HCL 10 MG/ML IJ SOLN
5.0000 mg | INTRAMUSCULAR | Status: DC | PRN
  Administered 2012-10-28: 10 mg via INTRAVENOUS
  Filled 2012-10-27 (×2): qty 1

## 2012-10-27 MED ORDER — DIPHENHYDRAMINE HCL 50 MG/ML IJ SOLN: 25.0000 mg | INTRAMUSCULAR | Status: DC | PRN

## 2012-10-27 MED ORDER — SODIUM CHLORIDE 0.9 % IJ SOLN: 3.0000 mL | INTRAMUSCULAR | Status: DC | PRN

## 2012-10-27 MED ORDER — SENNOSIDES-DOCUSATE SODIUM 8.6-50 MG PO TABS
2.0000 | ORAL_TABLET | Freq: Every day | ORAL | Status: DC
  Administered 2012-10-27 – 2012-10-28 (×2): 2 via ORAL

## 2012-10-27 MED ORDER — ONDANSETRON HCL 4 MG/2ML IJ SOLN
INTRAMUSCULAR | Status: DC | PRN
  Administered 2012-10-27: 4 mg via INTRAVENOUS

## 2012-10-27 MED ORDER — ONDANSETRON HCL 4 MG/2ML IJ SOLN: 4.0000 mg | Freq: Three times a day (TID) | INTRAMUSCULAR | Status: DC | PRN

## 2012-10-27 MED ORDER — CLINDAMYCIN PHOSPHATE 900 MG/50ML IV SOLN
900.0000 mg | INTRAVENOUS | Status: AC
Start: 2012-10-27 — End: 2012-10-27
  Administered 2012-10-27: 900 mg via INTRAVENOUS
  Filled 2012-10-27: qty 50

## 2012-10-27 MED ORDER — FAMOTIDINE IN NACL 20-0.9 MG/50ML-% IV SOLN
20.0000 mg | Freq: Once | INTRAVENOUS | Status: AC
  Administered 2012-10-27: 20 mg via INTRAVENOUS
  Filled 2012-10-27: qty 50

## 2012-10-27 MED ORDER — PHENYLEPHRINE HCL 10 MG/ML IJ SOLN
INTRAMUSCULAR | Status: DC | PRN
  Administered 2012-10-27: 40 ug via INTRAVENOUS
  Administered 2012-10-27 (×8): 80 ug via INTRAVENOUS
  Administered 2012-10-27: 40 ug via INTRAVENOUS
  Administered 2012-10-27: 80 ug via INTRAVENOUS

## 2012-10-27 MED ORDER — LACTATED RINGERS IV SOLN
INTRAVENOUS | Status: DC | PRN
  Administered 2012-10-27: 16:00:00 via INTRAVENOUS

## 2012-10-27 MED ORDER — NALBUPHINE HCL 10 MG/ML IJ SOLN
5.0000 mg | INTRAMUSCULAR | Status: DC | PRN
  Administered 2012-10-28: 10 mg via SUBCUTANEOUS
  Filled 2012-10-27 (×3): qty 1

## 2012-10-27 MED ORDER — MORPHINE SULFATE (PF) 0.5 MG/ML IJ SOLN
INTRAMUSCULAR | Status: DC | PRN
  Administered 2012-10-27: .1 mg via INTRATHECAL

## 2012-10-27 MED ORDER — MENTHOL 3 MG MT LOZG: 1.0000 | LOZENGE | OROMUCOSAL | Status: DC | PRN

## 2012-10-27 MED ORDER — DIPHENHYDRAMINE HCL 25 MG PO CAPS
25.0000 mg | ORAL_CAPSULE | ORAL | Status: DC | PRN
  Filled 2012-10-27: qty 1

## 2012-10-27 MED ORDER — SIMETHICONE 80 MG PO CHEW
80.0000 mg | CHEWABLE_TABLET | Freq: Three times a day (TID) | ORAL | Status: DC
  Administered 2012-10-27 – 2012-10-29 (×7): 80 mg via ORAL

## 2012-10-27 MED ORDER — ZOLPIDEM TARTRATE 5 MG PO TABS: 5.0000 mg | ORAL_TABLET | Freq: Every evening | ORAL | Status: DC | PRN

## 2012-10-27 MED ORDER — KETOROLAC TROMETHAMINE 30 MG/ML IJ SOLN: 30.0000 mg | Freq: Four times a day (QID) | INTRAMUSCULAR | Status: AC | PRN

## 2012-10-27 MED ORDER — FENTANYL CITRATE 0.05 MG/ML IJ SOLN: 25.0000 ug | INTRAMUSCULAR | Status: DC | PRN

## 2012-10-27 MED ORDER — METOCLOPRAMIDE HCL 5 MG/ML IJ SOLN: 10.0000 mg | Freq: Three times a day (TID) | INTRAMUSCULAR | Status: DC | PRN

## 2012-10-27 MED ORDER — FENTANYL CITRATE 0.05 MG/ML IJ SOLN
INTRAMUSCULAR | Status: AC
  Administered 2012-10-27: 50 ug via INTRAVENOUS
  Filled 2012-10-27: qty 2

## 2012-10-27 MED ORDER — OXYTOCIN 40 UNITS IN LACTATED RINGERS INFUSION - SIMPLE MED
INTRAVENOUS | Status: DC | PRN
  Administered 2012-10-27: 40 [IU] via INTRAVENOUS

## 2012-10-27 MED ORDER — PRENATAL MULTIVITAMIN CH
1.0000 | ORAL_TABLET | Freq: Every day | ORAL | Status: DC
  Administered 2012-10-28 – 2012-10-29 (×2): 1 via ORAL
  Filled 2012-10-27 (×2): qty 1

## 2012-10-27 MED ORDER — EPHEDRINE SULFATE 50 MG/ML IJ SOLN
INTRAMUSCULAR | Status: DC | PRN
  Administered 2012-10-27: 15 mg via INTRAVENOUS
  Administered 2012-10-27: 10 mg via INTRAVENOUS
  Administered 2012-10-27: 15 mg via INTRAVENOUS

## 2012-10-27 SURGICAL SUPPLY — 36 items
BARRIER ADHS 3X4 INTERCEED (GAUZE/BANDAGES/DRESSINGS) IMPLANT
BRR ADH 4X3 ABS CNTRL BYND (GAUZE/BANDAGES/DRESSINGS)
CLAMP CORD UMBIL (MISCELLANEOUS) ×1 IMPLANT
CLOTH BEACON ORANGE TIMEOUT ST (SAFETY) ×2 IMPLANT
CONTAINER PREFILL 10% NBF 15ML (MISCELLANEOUS) IMPLANT
DRAPE LG THREE QUARTER DISP (DRAPES) ×2 IMPLANT
DRSG OPSITE POSTOP 4X10 (GAUZE/BANDAGES/DRESSINGS) ×2 IMPLANT
DURAPREP 26ML APPLICATOR (WOUND CARE) ×2 IMPLANT
ELECT REM PT RETURN 9FT ADLT (ELECTROSURGICAL) ×2
ELECTRODE REM PT RTRN 9FT ADLT (ELECTROSURGICAL) ×1 IMPLANT
GLOVE BIO SURGEON STRL SZ 6.5 (GLOVE) ×2 IMPLANT
GOWN STRL REIN XL XLG (GOWN DISPOSABLE) ×4 IMPLANT
KIT ABG SYR 3ML LUER SLIP (SYRINGE) IMPLANT
NDL HYPO 25X5/8 SAFETYGLIDE (NEEDLE) IMPLANT
NDL SPNL 18GX3.5 QUINCKE PK (NEEDLE) ×1 IMPLANT
NEEDLE HYPO 25X5/8 SAFETYGLIDE (NEEDLE) IMPLANT
NEEDLE SPNL 18GX3.5 QUINCKE PK (NEEDLE) ×2 IMPLANT
NS IRRIG 1000ML POUR BTL (IV SOLUTION) ×2 IMPLANT
PACK C SECTION WH (CUSTOM PROCEDURE TRAY) ×2 IMPLANT
PAD OB MATERNITY 4.3X12.25 (PERSONAL CARE ITEMS) ×2 IMPLANT
STRIP CLOSURE SKIN 1/2X4 (GAUZE/BANDAGES/DRESSINGS) ×1 IMPLANT
SUT PDS AB 0 CTX 60 (SUTURE) ×1 IMPLANT
SUT VIC AB 0 CT1 27 (SUTURE)
SUT VIC AB 0 CT1 27XBRD ANBCTR (SUTURE) IMPLANT
SUT VIC AB 0 CT1 36 (SUTURE) IMPLANT
SUT VIC AB 2-0 CT1 27 (SUTURE) ×2
SUT VIC AB 2-0 CT1 TAPERPNT 27 (SUTURE) ×1 IMPLANT
SUT VIC AB 2-0 CTX 36 (SUTURE) ×4 IMPLANT
SUT VIC AB 3-0 CT1 27 (SUTURE) ×2
SUT VIC AB 3-0 CT1 TAPERPNT 27 (SUTURE) ×1 IMPLANT
SUT VIC AB 3-0 SH 27 (SUTURE)
SUT VIC AB 3-0 SH 27X BRD (SUTURE) IMPLANT
SYR 30ML LL (SYRINGE) ×2 IMPLANT
TOWEL OR 17X24 6PK STRL BLUE (TOWEL DISPOSABLE) ×6 IMPLANT
TRAY FOLEY CATH 14FR (SET/KITS/TRAYS/PACK) ×2 IMPLANT
WATER STERILE IRR 1000ML POUR (IV SOLUTION) ×1 IMPLANT

## 2012-10-27 NOTE — H&P (Signed)
  27 yo S AA G3P1A1 at 39;.[redacted] weeks EGA. She comes to the MAU with painful contractions. Her cervix was reported to be fingertip diatlated. She is scheduled for a RLTC June 4.   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 [redacted] weeks EGA in latent labor. She wants to have a RLTCS, declines a TOLAC. She desires a Mirena for birth control. She plans to breast feed.  She understands the risks of surgery, including, but not to infection, bleeding, DVTs, damage to bowel, bladder, ureters. She wishes to proceed.

## 2012-10-27 NOTE — MAU Note (Signed)
Pelvic and hip pain and pressure, doesn't think it is contractions.  No bleeding or leaking.  Pain increases when up and walking.

## 2012-10-27 NOTE — Anesthesia Procedure Notes (Signed)
Spinal  Patient location during procedure: OR Start time: 10/27/2012 4:01 PM Staffing Performed by: anesthesiologist  Preanesthetic Checklist Completed: patient identified, site marked, surgical consent, pre-op evaluation, timeout performed, IV checked, risks and benefits discussed and monitors and equipment checked Spinal Block Patient position: sitting Prep: site prepped and draped and DuraPrep Patient monitoring: heart rate, cardiac monitor, continuous pulse ox and blood pressure Approach: midline Location: L3-4 Injection technique: single-shot Needle Needle type: Pencan  Needle gauge: 24 G Needle length: 9 cm Assessment Sensory level: T4 Additional Notes Unable to seat introducer with Sprotte.  Switched to [employer].  Clear free flow CSF on first attempt with Pencan.  No paresthesia.  Patient tolerated procedure well with no apparent complications.  Jasmine December, MD

## 2012-10-27 NOTE — Transfer of Care (Signed)
Immediate Anesthesia Transfer of Care Note  Patient: Pamela [name]  Procedure(s) Performed: Procedure(s): CESAREAN SECTION (N/A)  Patient Location: PACU  Anesthesia Type:Spinal  Level of Consciousness: awake, alert  and oriented  Airway & Oxygen Therapy: Patient Spontanous Breathing and Patient connected to nasal cannula oxygen  Post-op Assessment: Report given to PACU RN and Post -op Vital signs reviewed and stable  Post vital signs: Reviewed and stable  Complications: No apparent anesthesia complications

## 2012-10-27 NOTE — Brief Op Note (Signed)
10/27/2012  4:32 PM  PATIENT:  Pamela [name]  27 y.o. female  PRE-OPERATIVE DIAGNOSIS:  Previous Cesarean Section in Labor, morbid obesity  POST-OPERATIVE DIAGNOSIS:  same  PROCEDURE:  Procedure(s): CESAREAN SECTION (N/A)  SURGEON:  Surgeon(s) and Role:    * Allie Bossier, MD - Primary  PHYSICIAN ASSISTANT:   ASSISTANTS: Natasha Bence, RN   ANESTHESIA:   spinal  EBL:  Total I/O In: 2800 [I.V.:2800] Out: 900 [Urine:200; Blood:700]  BLOOD ADMINISTERED:none  DRAINS: none   LOCAL MEDICATIONS USED:  MARCAINE     SPECIMEN:  Source of Specimen:  cord blood  DISPOSITION OF SPECIMEN:  PATHOLOGY  COUNTS:  YES  TOURNIQUET:  * No tourniquets in log *  DICTATION: .Dragon Dictation  PLAN OF CARE: Admit to inpatient   PATIENT DISPOSITION:  PACU - hemodynamically stable.   Delay start of Pharmacological VTE agent (>24hrs) due to surgical blood loss or risk of bleeding: not applicable  The risks, benefits, and alternatives of surgery were explained, understood, accepted. Consents were signed. All questions were answered. In the operating room spinal anesthesia was applied without complication. Her abdomen and vagina were prepped and draped in the usual sterile fashion. A Foley catheter was placed, draining clear urine throughout case. Timeout procedure was done. After adequate anesthesia was assured 30 mL for 0.5% Marcaine was injected into the subcutaneous tissue at the site of her previous cesarean. An incision was made through the previous incision. The incision was carried down through the subcutaneous tissue to the fascia. The fascia was scored the midline and extended bilaterally. The middle 30% of the rectus muscles were separated in a transverse fashion using electrosurgical technique. Excellent hemostasis was maintained. The peritoneum was entered with hemostats. Peritoneal incision was extended bilaterally with the Bovie. The bladder blade was placed. A transverse incision was  made on the well-developed lower uterine segment. The uterine incision was extended with traction on each side. Amniotomy was performed with a hemostat. Clear fluid was noted. The baby was delivered from a vertex presentation with the assistance of a Kiwi vaccuum with one pull (pressure in the green zone). The mouth and nostrils were suctioned prior to delivery of the shoulders.  The baby's cord was clamped and cut and was transferred to the NICU personnel for routine care. The placenta was delivered intact with traction. The uterus was left in situ and the interior was cleaned with a dry lap sponge. The uterine incision was closed with 2-0 Vicryl running locking suture. One layer closure was done due to the extreme thinness of the uterine edges. Excellent hemostasis was noted. By tilting the uterus each side, I was able to visualize the adnexa. They were normal. The rectus fascia rectus muscles were noted be hemostatic as well. The fascia was closed with a #1 PDS loop in a running nonlocking fashion. No defects were palpable. The subcutaneous tissue was irrigated, clean, and dried. A subcuticular closure was done with a 3-0 Vicryl suture. Steri-Strips are placed. Excellent cosmetic results were obtained. She was taken to the recovery room in stable condition. She tolerated the procedure well.

## 2012-10-27 NOTE — MAU Note (Signed)
Arrived via EMS with C/O pain in legs, worse with walking and movement in general. States she has felt some contractions, but C/O severe "stomach" pain. For repeat C/S on 6/4, but wants baby out today. Baby measuring LGA.

## 2012-10-27 NOTE — Op Note (Signed)
PATIENT: Pamela [name] 27 y.o. female  PRE-OPERATIVE DIAGNOSIS: Previous Cesarean Section in Labor, morbid obesity  POST-OPERATIVE DIAGNOSIS: same  PROCEDURE: Procedure(s):  CESAREAN SECTION (N/A)  SURGEON: Surgeon(s) and Role:  * Allie Bossier, MD - Primary  PHYSICIAN ASSISTANT:  ASSISTANTS: Natasha Bence, RN  ANESTHESIA: spinal  EBL: Total I/O  In: 2800 [I.V.:2800]  Out: 900 [Urine:200; Blood:700]  BLOOD ADMINISTERED:none  DRAINS: none  LOCAL MEDICATIONS USED: MARCAINE  SPECIMEN: Source of Specimen: cord blood  DISPOSITION OF SPECIMEN: PATHOLOGY  COUNTS: YES  TOURNIQUET: * No tourniquets in log *  DICTATION: .Dragon Dictation  PLAN OF CARE: Admit to inpatient  PATIENT DISPOSITION: PACU - hemodynamically stable.  Delay start of Pharmacological VTE agent (>24hrs) due to surgical blood loss or risk of bleeding: not applicable  The risks, benefits, and alternatives of surgery were explained, understood, accepted. Consents were signed. All questions were answered. In the operating room spinal anesthesia was applied without complication. Her abdomen and vagina were prepped and draped in the usual sterile fashion. A Foley catheter was placed, draining clear urine throughout case. Timeout procedure was done. After adequate anesthesia was assured 30 mL for 0.5% Marcaine was injected into the subcutaneous tissue at the site of her previous cesarean. An incision was made through the previous incision. The incision was carried down through the subcutaneous tissue to the fascia. The fascia was scored the midline and extended bilaterally. The middle 30% of the rectus muscles were separated in a transverse fashion using electrosurgical technique. Excellent hemostasis was maintained. The peritoneum was entered with hemostats. Peritoneal incision was extended bilaterally with the Bovie. The bladder blade was placed. A transverse incision was made on the well-developed lower uterine segment. The uterine  incision was extended with traction on each side. Amniotomy was performed with a hemostat. Clear fluid was noted. The baby was delivered from a vertex presentation with the assistance of a Kiwi vaccuum with one pull (pressure in the green zone). The mouth and nostrils were suctioned prior to delivery of the shoulders. The baby's cord was clamped and cut and was transferred to the NICU personnel for routine care. The placenta was delivered intact with traction. The uterus was left in situ and the interior was cleaned with a dry lap sponge. The uterine incision was closed with 2-0 Vicryl running locking suture. One layer closure was done due to the extreme thinness of the uterine edges. Excellent hemostasis was noted. By tilting the uterus each side, I was able to visualize the adnexa. They were normal. The rectus fascia rectus muscles were noted be hemostatic as well. The fascia was closed with a #1 PDS loop in a running nonlocking fashion. No defects were palpable. The subcutaneous tissue was irrigated, clean, and dried. A subcuticular closure was done with a 3-0 Vicryl suture. Steri-Strips are placed. Excellent cosmetic results were obtained. She was taken to the recovery room in stable condition. She tolerated the procedure well.

## 2012-10-27 NOTE — Anesthesia Preprocedure Evaluation (Signed)
Anesthesia Evaluation  Patient identified by MRN, date of birth, ID band Patient awake    Reviewed: Allergy & Precautions, H&P , NPO status , Patient's Chart, lab work & pertinent test results, reviewed documented beta blocker date and time   History of Anesthesia Complications Negative for: history of anesthetic complications  Airway Mallampati: III TM Distance: >3 FB Neck ROM: full    Dental  (+) Teeth Intact   Pulmonary former smoker (quit several months ago),  breath sounds clear to auscultation        Cardiovascular negative cardio ROS  Rhythm:regular Rate:Normal     Neuro/Psych PSYCHIATRIC DISORDERS (anxiety, h/o PP depression- stopped lexapro one week ago) negative neurological ROS     GI/Hepatic negative GI ROS, Neg liver ROS,   Endo/Other  Morbid obesity  Renal/GU negative Renal ROS  negative genitourinary   Musculoskeletal   Abdominal   Peds  Hematology negative hematology ROS (+)   Anesthesia Other Findings   Reproductive/Obstetrics (+) Pregnancy (h/o c/s x1, labor --> repeat c/s)                           Anesthesia Physical Anesthesia Plan  ASA: III and emergent  Anesthesia Plan: Spinal   Post-op Pain Management:    Induction:   Airway Management Planned:   Additional Equipment:   Intra-op Plan:   Post-operative Plan:   Informed Consent: I have reviewed the patients History and Physical, chart, labs and discussed the procedure including the risks, benefits and alternatives for the proposed anesthesia with the patient or authorized representative who has indicated his/her understanding and acceptance.     Plan Discussed with: Surgeon and CRNA  Anesthesia Plan Comments:         Anesthesia Quick Evaluation

## 2012-10-28 ENCOUNTER — Encounter (HOSPITAL_COMMUNITY): Payer: Self-pay | Admitting: Obstetrics & Gynecology

## 2012-10-28 LAB — CBC
Hemoglobin: 8.6 g/dL — ABNORMAL LOW (ref 12.0–15.0)
MCHC: 32.8 g/dL (ref 30.0–36.0)
RDW: 15.1 % (ref 11.5–15.5)

## 2012-10-28 LAB — US OB FOLLOW UP

## 2012-10-28 NOTE — Anesthesia Postprocedure Evaluation (Signed)
  Anesthesia Post-op Note  Anesthesia Post Note  Patient: Pamela [name]  Procedure(s) Performed: Procedure(s) (LRB): CESAREAN SECTION (N/A)  Anesthesia type: Spinal  Patient location: PACU  Post pain: Pain level controlled  Post assessment: Post-op Vital signs reviewed  Post vital signs: Reviewed  Level of consciousness: awake  Complications: No apparent anesthesia complications

## 2012-10-28 NOTE — Anesthesia Postprocedure Evaluation (Signed)
  Anesthesia Post-op Note  Patient: Pamela [name]  Procedure(s) Performed: Procedure(s): CESAREAN SECTION (N/A)  Patient Location: Mother/Baby  Anesthesia Type:Spinal  Level of Consciousness: awake  Airway and Oxygen Therapy: Patient Spontanous Breathing  Post-op Pain: none  Post-op Assessment: Patient's Cardiovascular Status Stable, Respiratory Function Stable, Patent Airway, No signs of Nausea or vomiting, Adequate PO intake and Pain level controlled  Post-op Vital Signs: Reviewed and stable  Complications: No apparent anesthesia complications

## 2012-10-28 NOTE — Progress Notes (Signed)
Subjective: Postpartum Day #1: Cesarean Delivery Patient reports tolerating PO and no problems voiding.  Denies dizziness or s/s anemia.  Breastfeeding. Planning on Mirena for PP contraception.  Objective: Vital signs in last 24 hours: Temp:  [97.6 F (36.4 C)-98.5 F (36.9 C)] 98.1 F (36.7 C) (05/28 0200) Pulse Rate:  [91-128] 105 (05/28 0400) Resp:  [16-25] 18 (05/28 0400) BP: (103-143)/(51-78) 128/67 mmHg (05/28 0400) SpO2:  [98 %-100 %] 99 % (05/28 0700) Weight:  [146.965 kg (324 lb)] 146.965 kg (324 lb) (05/27 2342)  Physical Exam:  General: alert, cooperative and mild distress Lungs: nl effort Heart: RRR Lochia: appropriate Uterine Fundus: firm Incision: no dehiscence, no significant erythema, sm stain- unchanged DVT Evaluation: No evidence of DVT seen on physical exam.   Recent Labs  10/27/12 1340 10/28/12 0645  HGB 10.2* 8.6*  HCT 31.0* 26.2*    Assessment/Plan: Status post Cesarean section. Doing well postoperatively.  Continue current care. Anticipate d/c in AM 5/29.  Cam Hai 10/28/2012, 8:55 AM

## 2012-10-29 DIAGNOSIS — Z98891 History of uterine scar from previous surgery: Secondary | ICD-10-CM

## 2012-10-29 MED ORDER — SENNOSIDES-DOCUSATE SODIUM 8.6-50 MG PO TABS: 2.0000 | ORAL_TABLET | Freq: Every day | ORAL | Status: DC

## 2012-10-29 MED ORDER — IBUPROFEN 600 MG PO TABS: 600.0000 mg | ORAL_TABLET | Freq: Four times a day (QID) | ORAL | Status: DC

## 2012-10-29 MED ORDER — FERROUS SULFATE 325 (65 FE) MG PO TABS: 325.0000 mg | ORAL_TABLET | Freq: Three times a day (TID) | ORAL | Status: DC

## 2012-10-29 MED ORDER — OXYCODONE-ACETAMINOPHEN 5-325 MG PO TABS: 1.0000 | ORAL_TABLET | ORAL | Status: DC | PRN

## 2012-10-29 NOTE — Progress Notes (Signed)
I saw and examined patient and agree with above student note. I reviewed history, delivery summary, labs and vitals. Cecely Rengel, MD  

## 2012-10-29 NOTE — Discharge Summary (Signed)
Obstetric Discharge Summary Pamela [name] is a 27 y.o. [id number] presenting at [redacted]w[redacted]d in early labor with one prior cesarean section. She had originally planned TOLAC but opted instead for repeat cesarean section. She underwent repeat c-section with no complications. Her postpartum course was unremarkable. She is breastfeeding and plans Mirena IUD for contraception.  Reason for Admission: onset of labor Prenatal Procedures: none Intrapartum Procedures: cesarean: low cervical, transverse Postpartum Procedures: none Complications-Operative and Postpartum: none Hemoglobin  Date Value Range Status  10/28/2012 8.6* 12.0 - 15.0 g/dL Final     HCT  Date Value Range Status  10/28/2012 26.2* 36.0 - 46.0 % Final    Physical Exam:  General: alert, cooperative and no distress  CVS: RRR, good S1 and S2, no RGM  Lungs: CTA bilaterally, no wheeze, rales, or rhonchi  ABD: Normoactive bowel sounds x4, non-tender, non-distended  Lochia: appropriate  Uterine Fundus: firm  Incision: Dressing is clean, dry, and intact  DVT Evaluation: No evidence of DVT seen on physical exam. Negative Homan's sign.   Discharge Diagnoses: Term Pregnancy-delivered, s/p repeat cesarean section  Discharge Information: Date: 10/29/2012 Activity: pelvic rest Diet: routine Medications: PNV, Ibuprofen, Colace, Iron and Percocet Condition: stable Instructions: refer to practice specific booklet Discharge to: home Follow-up Information   Follow up with [facility]. Schedule an appointment as soon as possible for a visit in 6 weeks. (For postpartum visit or  as needed if symptoms worsen)    Contact information:   [address] [place] [address] [phone]      Newborn Data: Live born female  Birth Weight: 8 lb 14.5 oz (4040 g) APGAR: 9, 9  Home with mother.  Napoleon Form 10/29/2012, 3:09 PM

## 2012-10-29 NOTE — Clinical Social Work Maternal (Signed)
    Clinical Social Work Department PSYCHOSOCIAL ASSESSMENT - MATERNAL/CHILD 10/29/2012  Patient:  MCKYNLEE, LUSE  Account Number:  [id number]  Admit Date:  10/27/2012  Marjo Bicker Name:   Florentina Addison    Clinical Social Worker:  Nobie Putnam, LCSW   Date/Time:  10/28/2012 02:00 PM  Date Referred:  10/28/2012   Referral source  CN     Referred reason  Substance Abuse  Behavioral Health Issues   Other referral source:    I:  FAMILY / HOME ENVIRONMENT Child's legal guardian:  PARENT  Guardian - Name Guardian - Age Guardian - Address  Chesni Vos [address]. [place], [place]  [place]     Other household support members/support persons Name Relationship DOB  Dillan [facility] 2010   Other support:   Fayrene Fearing & Chaney Born, pt's parents    II  PSYCHOSOCIAL DATA Information Source:  Patient Interview  Event organiser Employment:   Financial resources:  [employer] If Medicaid - County:  [employer] Other  Food Stamps  [employer]  [employer]   School / Grade:   Maternity Care Coordinator / Child Services Coordination / Early Interventions:   Carlyon Prows  Cultural issues impacting care:    III  STRENGTHS Strengths  Adequate Resources  Home prepared for Child (including basic supplies)  Supportive family/friends   Strength comment:    IV  RISK FACTORS AND CURRENT PROBLEMS Current Problem:  YES   Risk Factor & Current Problem Patient Issue Family Issue Risk Factor / Current Problem Comment  Substance Abuse Y N Hx of MJ use  Mental Illness Y N Hx of PP depression    V  SOCIAL WORK ASSESSMENT CSW referral received to assess pt's history of MJ use & PP depression.  Pt admits to smoking MJ "2 times a week" prior to pregnancy confirmed at 3 months.  Pt continued to smoke MJ during the pregnancy but was able to stop 1 month ago. She denies other illegal substance use & verbalized understanding of hospital drug testing policy.  UDS is  negative, meconium results are pending.  Pt depression symptoms are currently being treated with Lexapro.  She denies any SI/HI feelings.  CSW encouraged pt to seek medical attention if depression symptoms worsen.  Pt has supplies for the infant & good support.  CSW available to assist further in needed.      VI SOCIAL WORK PLAN Social Work Plan  No Further Intervention Required / No Barriers to Discharge   Type of pt/family education:   If child protective services report - county:   If child protective services report - date:   Information/referral to community resources comment:   Other social work plan:

## 2012-10-29 NOTE — Progress Notes (Signed)
Patient reports several clots while voiding. Patient encouraged to call nurse to see clots if anymore.

## 2012-10-29 NOTE — Progress Notes (Signed)
Subjective: Postpartum Day #2: Cesarean Delivery Patient reports tolerating PO and no problems voiding.    Pamela [name] is a 27 y.o. female [id number] who had her C-section 5/27 at 1612.  She has been urinating regularly, but has yet to make a bowel movement or pass flatus.  She denies dizziness upon standing.  She rates her pain at 8/10, but that oral percocet has helped.  She plans to breast feed exclusively and will seek the Mirena for her birth control needs at [facility] where she receives OBGYN care.  She will schedule for her son to have a circumcision at [facility].    Objective: Vital signs in last 24 hours: Temp:  [97.4 F (36.3 C)-98.4 F (36.9 C)] 98.4 F (36.9 C) (05/29 0626) Pulse Rate:  [85-110] 110 (05/29 0626) Resp:  [17-18] 17 (05/29 0626) BP: (135-148)/(70-91) 148/91 mmHg (05/29 0626) SpO2:  [98 %-100 %] 99 % (05/29 0626)  Physical Exam:  General: alert, cooperative and no distress CVS: RRR, good S1 and S2, no RGM Lungs: CTA bilaterally, no wheeze, rales, or rhonchi ABD: Normoactive bowel sounds x4, non-tender, non-distended Lochia: appropriate Uterine Fundus: firm Incision: Dressing is clean, dry, and intact  DVT Evaluation: No evidence of DVT seen on physical exam.  Negative Homan's sign.   Recent Labs  10/27/12 1340 10/28/12 0645  HGB 10.2* 8.6*  HCT 31.0* 26.2*    Assessment/Plan: Status post Cesarean section. Doing well postoperatively.   She states that she may be willing to go home today with a late afternoon discharge provided it is safe for her to leave.   Continue current care.  Anna Genre 10/29/2012, 9:15 AM

## 2012-10-30 ENCOUNTER — Encounter: Payer: Medicaid Other | Admitting: Obstetrics & Gynecology

## 2012-10-30 NOTE — Progress Notes (Signed)
Post discharge chart review completed.  

## 2012-11-03 ENCOUNTER — Telehealth: Payer: Self-pay | Admitting: Obstetrics & Gynecology

## 2012-11-03 NOTE — Telephone Encounter (Signed)
Pt informed note left at front desk for pt to pick up.

## 2012-11-03 NOTE — Telephone Encounter (Signed)
Pt states has been working with Social Services to find employment, also states was unable to apply for jobs in the month of May due to pregnancy and needs a note from [place] for [facility] stating pt was unable to apply for jobs. Pt has a C-section May 27,2014.  Can a note be given to pt for this?

## 2012-11-11 ENCOUNTER — Encounter: Payer: Self-pay | Admitting: Adult Health

## 2012-11-11 ENCOUNTER — Ambulatory Visit (INDEPENDENT_AMBULATORY_CARE_PROVIDER_SITE_OTHER): Payer: Medicaid Other | Admitting: Adult Health

## 2012-11-11 VITALS — BP 120/78 | Ht 70.5 in | Wt 291.0 lb

## 2012-11-11 DIAGNOSIS — O99345 Other mental disorders complicating the puerperium: Secondary | ICD-10-CM

## 2012-11-11 DIAGNOSIS — L7682 Other postprocedural complications of skin and subcutaneous tissue: Secondary | ICD-10-CM

## 2012-11-11 DIAGNOSIS — F53 Postpartum depression: Secondary | ICD-10-CM | POA: Insufficient documentation

## 2012-11-11 DIAGNOSIS — N390 Urinary tract infection, site not specified: Secondary | ICD-10-CM | POA: Insufficient documentation

## 2012-11-11 DIAGNOSIS — R42 Dizziness and giddiness: Secondary | ICD-10-CM

## 2012-11-11 LAB — CBC
HCT: 37.2 % (ref 36.0–46.0)
Hemoglobin: 12.2 g/dL (ref 12.0–15.0)
RBC: 4.65 MIL/uL (ref 3.87–5.11)

## 2012-11-11 MED ORDER — PENICILLIN V POTASSIUM 500 MG PO TABS: 500.0000 mg | ORAL_TABLET | Freq: Four times a day (QID) | ORAL | Status: DC

## 2012-11-11 NOTE — Patient Instructions (Addendum)
Follow up in 1 week  Call for labs in am Call if mood changes

## 2012-11-11 NOTE — Progress Notes (Signed)
Subjective:     Patient ID: Pamela [name], female   DOB: May 06, 1986, 27 y.o.   MRN: [id number]  HPI Pamela [name] had a C-section 5/27 and the [facility] said her depression score was 14 yesterday,today the score is 13 but she declines meds, she says she is not suicidal, and does not want meds at this time. She does say it hurts to pee and she has vaginal odor and that her incision hurts.She has been dizzy and has had night sweats. No sex yet and she is breastfeeding. Review of Systems Positives as in HPI  Reviewed past medical,surgical, social and family history. Reviewed medications and allergies.     Objective:   Physical Exam BP 120/78  Ht 5' 10.5" (1.791 m)  Wt 291 lb (131.997 kg)  BMI 41.15 kg/m2  LMP 02/04/2012  Breastfeeding? YesUrine +blood and protein.Skin warm and dry. She still has the honey comb dressing on and I removed it and cleaned site with water, edges together, no redness or swelling,she says its sore but feels better with dressing off. Pelvic: external genitalia is normal in appearance, vagina: dark period type blood without odor, cervix:smooth and bulbous, uterus: normal size, shape and contour, a little  tender, no masses felt, adnexa: no masses or tenderness noted.     Assessment:    S/p C-section with some incisional pain  Postpartum depression UTI   Dizziness Plan:      UA C&S Check CBC,CMP,TSH,ESR Rx Veetids 500 mg qid x 7 days  Push fluids  Call with mood changes   Follow in office in 1 week

## 2012-11-12 LAB — COMPREHENSIVE METABOLIC PANEL
Albumin: 4 g/dL (ref 3.5–5.2)
Alkaline Phosphatase: 91 U/L (ref 39–117)
CO2: 24 mEq/L (ref 19–32)
Calcium: 9.7 mg/dL (ref 8.4–10.5)
Chloride: 106 mEq/L (ref 96–112)
Glucose, Bld: 85 mg/dL (ref 70–99)
Potassium: 4.6 mEq/L (ref 3.5–5.3)
Sodium: 139 mEq/L (ref 135–145)
Total Protein: 7.3 g/dL (ref 6.0–8.3)

## 2012-11-12 LAB — URINALYSIS
Glucose, UA: NEGATIVE mg/dL
Leukocytes, UA: NEGATIVE
Nitrite: NEGATIVE
Specific Gravity, Urine: 1.022 (ref 1.005–1.030)
pH: 7 (ref 5.0–8.0)

## 2012-11-12 LAB — SEDIMENTATION RATE: Sed Rate: 23 mm/hr — ABNORMAL HIGH (ref 0–22)

## 2012-11-12 LAB — TSH: TSH: 0.261 u[IU]/mL — ABNORMAL LOW (ref 0.350–4.500)

## 2012-11-13 ENCOUNTER — Telehealth: Payer: Self-pay | Admitting: Adult Health

## 2012-11-13 ENCOUNTER — Encounter: Payer: Medicaid Other | Admitting: Obstetrics and Gynecology

## 2012-11-13 NOTE — Telephone Encounter (Signed)
Says she feels much better, has appt next week

## 2012-11-14 LAB — URINE CULTURE

## 2012-11-18 ENCOUNTER — Ambulatory Visit: Payer: Medicaid Other | Admitting: Adult Health

## 2012-11-19 ENCOUNTER — Telehealth: Payer: Self-pay | Admitting: Adult Health

## 2012-11-19 ENCOUNTER — Telehealth: Payer: Self-pay | Admitting: *Deleted

## 2012-11-19 NOTE — Telephone Encounter (Signed)
Pt broke out in hives last night. Started taking [facility] Monday, just noticed the hives last night. Has not taken medication today, no hives today. Follow up on 11/24/12. Spoke with JAG, she advised to stop [facility] and keep appointment for the 24th. Pt verbalized understanding.

## 2012-11-19 NOTE — Telephone Encounter (Signed)
[name] [facility] called saying she Saw [name] today and she seemed OK but she was just making sure that what Pamela [name] told her about her declining meds for postpartum depression was true and I told her yes that Pamela [name] declined meds and that she has a follow up 6/24 and I can re address it again

## 2012-11-24 ENCOUNTER — Ambulatory Visit: Payer: Medicaid Other | Admitting: Adult Health

## 2012-12-18 ENCOUNTER — Encounter: Payer: Self-pay | Admitting: Obstetrics & Gynecology

## 2012-12-18 ENCOUNTER — Ambulatory Visit (INDEPENDENT_AMBULATORY_CARE_PROVIDER_SITE_OTHER): Payer: Medicaid Other | Admitting: Obstetrics & Gynecology

## 2012-12-18 MED ORDER — MISOPROSTOL 200 MCG PO TABS: ORAL_TABLET | ORAL | Status: DC

## 2012-12-18 NOTE — Patient Instructions (Addendum)
Levonorgestrel intrauterine device (IUD) What is this medicine? LEVONORGESTREL IUD (LEE voe nor jes trel) is a contraceptive (birth control) device. The device is placed inside the uterus by a healthcare professional. It is used to prevent pregnancy and can also be used to treat heavy bleeding that occurs during your period. Depending on the device, it can be used for 3 to 5 years. This medicine may be used for other purposes; ask your health care provider or pharmacist if you have questions. What should I tell my health care provider before I take this medicine? They need to know if you have any of these conditions: -abnormal Pap smear -cancer of the breast, uterus, or cervix -diabetes -endometritis -genital or pelvic infection now or in the past -have more than one sexual partner or your partner has more than one partner -heart disease -history of an ectopic or tubal pregnancy -immune system problems -IUD in place -liver disease or tumor -problems with blood clots or take blood-thinners -use intravenous drugs -uterus of unusual shape -vaginal bleeding that has not been explained -an unusual or allergic reaction to levonorgestrel, other hormones, silicone, or polyethylene, medicines, foods, dyes, or preservatives -pregnant or trying to get pregnant -breast-feeding How should I use this medicine? This device is placed inside the uterus by a health care professional. Talk to your pediatrician regarding the use of this medicine in children. Special care may be needed. Overdosage: If you think you have taken too much of this medicine contact a poison control center or emergency room at once. NOTE: This medicine is only for you. Do not share this medicine with others. What if I miss a dose? This does not apply. What may interact with this medicine? Do not take this medicine with any of the following medications: -amprenavir -bosentan -fosamprenavir This medicine may also interact with  the following medications: -aprepitant -barbiturate medicines for inducing sleep or treating seizures -bexarotene -griseofulvin -medicines to treat seizures like carbamazepine, ethotoin, felbamate, oxcarbazepine, phenytoin, topiramate -modafinil -pioglitazone -rifabutin -rifampin -rifapentine -some medicines to treat HIV infection like atazanavir, indinavir, lopinavir, nelfinavir, tipranavir, ritonavir -St. John's wort -warfarin This list may not describe all possible interactions. Give your health care provider a list of all the medicines, herbs, non-prescription drugs, or dietary supplements you use. Also tell them if you smoke, drink alcohol, or use illegal drugs. Some items may interact with your medicine. What should I watch for while using this medicine? Visit your doctor or health care professional for regular check ups. See your doctor if you or your partner has sexual contact with others, becomes HIV positive, or gets a sexual transmitted disease. This product does not protect you against HIV infection (AIDS) or other sexually transmitted diseases. You can check the placement of the IUD yourself by reaching up to the top of your vagina with clean fingers to feel the threads. Do not pull on the threads. It is a good habit to check placement after each menstrual period. Call your doctor right away if you feel more of the IUD than just the threads or if you cannot feel the threads at all. The IUD may come out by itself. You may become pregnant if the device comes out. If you notice that the IUD has come out use a backup birth control method like condoms and call your health care provider. Using tampons will not change the position of the IUD and are okay to use during your period. What side effects may I notice from receiving this medicine?   Side effects that you should report to your doctor or health care professional as soon as possible: -allergic reactions like skin rash, itching or  hives, swelling of the face, lips, or tongue -fever, flu-like symptoms -genital sores -high blood pressure -no menstrual period for 6 weeks during use -pain, swelling, warmth in the leg -pelvic pain or tenderness -severe or sudden headache -signs of pregnancy -stomach cramping -sudden shortness of breath -trouble with balance, talking, or walking -unusual vaginal bleeding, discharge -yellowing of the eyes or skin Side effects that usually do not require medical attention (report to your doctor or health care professional if they continue or are bothersome): -acne -breast pain -change in sex drive or performance -changes in weight -cramping, dizziness, or faintness while the device is being inserted -headache -irregular menstrual bleeding within first 3 to 6 months of use -nausea This list may not describe all possible side effects. Call your doctor for medical advice about side effects. You may report side effects to FDA at 1-800-FDA-1088. Where should I keep my medicine? This does not apply. NOTE: This sheet is a summary. It may not cover all possible information. If you have questions about this medicine, talk to your doctor, pharmacist, or health care provider.  2013, Elsevier/Gold Standard. (06/20/2011 1:54:04 PM)  

## 2012-12-18 NOTE — Progress Notes (Signed)
Patient ID: Pamela [name], female   DOB: Nov 20, 1985, 27 y.o.   MRN: [id number] Pamela [name] is in for her postpartum exam She delivered by repeat C-section on May 27 Her postpartum course has been unremarkable She is currently bottlefeeding She is without complaints  Her exam today is benign Her incision is clean dry and intact  vagina pink and moist with moderate discharge which is asymptomatic no the cervix is nulliparous in appearance  uterus is slightly retroverted and normally involuted Adnexa negative  Pamela [name] desperately wants a Mirena She's sway her sway her sway areas that she has not had sex  As a result we will see her next week and put in a Mirena A Cytotec is called in to the pharmacy for her to placed in the vagina 2 hours before the procedure She is also given a take naproxen sodium or ibuprofen preprocedure as well

## 2012-12-25 ENCOUNTER — Ambulatory Visit (INDEPENDENT_AMBULATORY_CARE_PROVIDER_SITE_OTHER): Payer: Medicaid Other | Admitting: Obstetrics and Gynecology

## 2012-12-25 ENCOUNTER — Encounter: Payer: Self-pay | Admitting: Obstetrics and Gynecology

## 2012-12-25 VITALS — BP 120/80 | Wt 304.0 lb

## 2012-12-25 DIAGNOSIS — Z3202 Encounter for pregnancy test, result negative: Secondary | ICD-10-CM

## 2012-12-25 DIAGNOSIS — Z30430 Encounter for insertion of intrauterine contraceptive device: Secondary | ICD-10-CM

## 2012-12-25 DIAGNOSIS — Z3200 Encounter for pregnancy test, result unknown: Secondary | ICD-10-CM

## 2012-12-25 DIAGNOSIS — Z309 Encounter for contraceptive management, unspecified: Secondary | ICD-10-CM

## 2012-12-25 NOTE — Patient Instructions (Signed)
Levonorgestrel intrauterine device (IUD) What is this medicine? LEVONORGESTREL IUD (LEE voe nor jes trel) is a contraceptive (birth control) device. The device is placed inside the uterus by a healthcare professional. It is used to prevent pregnancy and can also be used to treat heavy bleeding that occurs during your period. Depending on the device, it can be used for 3 to 5 years. This medicine may be used for other purposes; ask your health care provider or pharmacist if you have questions. What should I tell my health care provider before I take this medicine? They need to know if you have any of these conditions: -abnormal Pap smear -cancer of the breast, uterus, or cervix -diabetes -endometritis -genital or pelvic infection now or in the past -have more than one sexual partner or your partner has more than one partner -heart disease -history of an ectopic or tubal pregnancy -immune system problems -IUD in place -liver disease or tumor -problems with blood clots or take blood-thinners -use intravenous drugs -uterus of unusual shape -vaginal bleeding that has not been explained -an unusual or allergic reaction to levonorgestrel, other hormones, silicone, or polyethylene, medicines, foods, dyes, or preservatives -pregnant or trying to get pregnant -breast-feeding How should I use this medicine? This device is placed inside the uterus by a health care professional. Talk to your pediatrician regarding the use of this medicine in children. Special care may be needed. Overdosage: If you think you have taken too much of this medicine contact a poison control center or emergency room at once. NOTE: This medicine is only for you. Do not share this medicine with others. What if I miss a dose? This does not apply. What may interact with this medicine? Do not take this medicine with any of the following medications: -amprenavir -bosentan -fosamprenavir This medicine may also interact with  the following medications: -aprepitant -barbiturate medicines for inducing sleep or treating seizures -bexarotene -griseofulvin -medicines to treat seizures like carbamazepine, ethotoin, felbamate, oxcarbazepine, phenytoin, topiramate -modafinil -pioglitazone -rifabutin -rifampin -rifapentine -some medicines to treat HIV infection like atazanavir, indinavir, lopinavir, nelfinavir, tipranavir, ritonavir -St. Marlina [place]'s wort -warfarin This list may not describe all possible interactions. Give your health care provider a list of all the medicines, herbs, non-prescription drugs, or dietary supplements you use. Also tell them if you smoke, drink alcohol, or use illegal drugs. Some items may interact with your medicine. What should I watch for while using this medicine? Visit your doctor or health care professional for regular check ups. See your doctor if you or your partner has sexual contact with others, becomes HIV positive, or gets a sexual transmitted disease. This product does not protect you against HIV infection (AIDS) or other sexually transmitted diseases. You can check the placement of the IUD yourself by reaching up to the top of your vagina with clean fingers to feel the threads. Do not pull on the threads. It is a good habit to check placement after each menstrual period. Call your doctor right away if you feel more of the IUD than just the threads or if you cannot feel the threads at all. The IUD may come out by itself. You may become pregnant if the device comes out. If you notice that the IUD has come out use a backup birth control method like condoms and call your health care provider. Using tampons will not change the position of the IUD and are okay to use during your period. What side effects may I notice from receiving this medicine?   Side effects that you should report to your doctor or health care professional as soon as possible: -allergic reactions like skin rash, itching or  hives, swelling of the face, lips, or tongue -fever, flu-like symptoms -genital sores -high blood pressure -no menstrual period for 6 weeks during use -pain, swelling, warmth in the leg -pelvic pain or tenderness -severe or sudden headache -signs of pregnancy -stomach cramping -sudden shortness of breath -trouble with balance, talking, or walking -unusual vaginal bleeding, discharge -yellowing of the eyes or skin Side effects that usually do not require medical attention (report to your doctor or health care professional if they continue or are bothersome): -acne -breast pain -change in sex drive or performance -changes in weight -cramping, dizziness, or faintness while the device is being inserted -headache -irregular menstrual bleeding within first 3 to 6 months of use -nausea This list may not describe all possible side effects. Call your doctor for medical advice about side effects. You may report side effects to FDA at 1-800-FDA-1088. Where should I keep my medicine? This does not apply. NOTE: This sheet is a summary. It may not cover all possible information. If you have questions about this medicine, talk to your doctor, pharmacist, or health care provider.  2013, Elsevier/Gold Standard. (06/20/2011 1:54:04 PM)  

## 2012-12-25 NOTE — Progress Notes (Signed)
Patient ID: Pamela [name], female   DOB: Mar 29, 1986, 27 y.o.   MRN: [id number] Pt here today for IUD insertion. Pt has not had intercourse since her delivery. We used an IUD that had been ordered for her.   Patient identified, informed consent performed, signed copy in chart, time out was performed.  Urine pregnancy test negative.  Speculum placed in the vagina.  Cervix visualized.  Cleaned with Betadine x 2.  Grasped anteriourly with a single tooth tenaculum.  Uterus sounded to 8 cm anteflexed.  Mirena IUD placed per manufacturer's recommendations.  Strings trimmed to 3 cm.   Patient given post procedure instructions and Mirena care card with expiration date.  Patient is asked to check IUD strings periodically and follow up in 4-6 weeks for IUD check.

## 2013-01-22 ENCOUNTER — Ambulatory Visit: Payer: Medicaid Other | Admitting: Obstetrics and Gynecology

## 2013-01-29 ENCOUNTER — Encounter: Payer: Self-pay | Admitting: Obstetrics and Gynecology

## 2013-01-29 ENCOUNTER — Telehealth: Payer: Self-pay | Admitting: Obstetrics and Gynecology

## 2013-01-29 ENCOUNTER — Ambulatory Visit (INDEPENDENT_AMBULATORY_CARE_PROVIDER_SITE_OTHER): Payer: Medicaid Other | Admitting: Obstetrics and Gynecology

## 2013-01-29 VITALS — BP 120/80 | Ht 71.0 in | Wt 296.0 lb

## 2013-01-29 DIAGNOSIS — Z30431 Encounter for routine checking of intrauterine contraceptive device: Secondary | ICD-10-CM

## 2013-01-29 MED ORDER — NYSTATIN-TRIAMCINOLONE 100000-0.1 UNIT/GM-% EX OINT: TOPICAL_OINTMENT | Freq: Two times a day (BID) | CUTANEOUS | Status: DC

## 2013-01-29 NOTE — Telephone Encounter (Signed)
Pt aware that there is nothing else that medicaid will cover. Spoke to Dr. Emelda Fear and he advised pt get hydrocortisone cream and try that. Pt advised and verbalized understanding.

## 2013-01-29 NOTE — Patient Instructions (Signed)
Sting is retracted too far to feel with self-exam

## 2013-01-29 NOTE — Progress Notes (Signed)
Patient ID: Pamela [name], female   DOB: 06-Mar-1986, 27 y.o.   MRN: [id number] Pt here to check IUD.  3 wk s/p iud placement U/s by jvf TV:  iud in excellent position, both prongs properly deployed in cornual areas. Physical Examination: Pelvic - normal external genitalia, vulva, vagina, cervix, uterus and adnexa, STRING NOT VISIBLE.  A proper iud position     Retracted iud string-- pt aware.

## 2013-02-08 ENCOUNTER — Other Ambulatory Visit: Payer: Self-pay | Admitting: *Deleted

## 2013-02-08 MED ORDER — NYSTATIN-TRIAMCINOLONE 100000-0.1 UNIT/GM-% EX OINT: TOPICAL_OINTMENT | Freq: Two times a day (BID) | CUTANEOUS | Status: DC

## 2013-02-11 ENCOUNTER — Telehealth: Payer: Self-pay | Admitting: Adult Health

## 2013-07-19 ENCOUNTER — Ambulatory Visit (INDEPENDENT_AMBULATORY_CARE_PROVIDER_SITE_OTHER): Payer: Medicaid Other | Admitting: Adult Health

## 2013-07-19 ENCOUNTER — Encounter: Payer: Self-pay | Admitting: Adult Health

## 2013-07-19 VITALS — BP 120/72 | Ht 71.0 in | Wt 306.0 lb

## 2013-07-19 DIAGNOSIS — F411 Generalized anxiety disorder: Secondary | ICD-10-CM

## 2013-07-19 DIAGNOSIS — F419 Anxiety disorder, unspecified: Secondary | ICD-10-CM

## 2013-07-19 MED ORDER — SERTRALINE HCL 50 MG PO TABS: 50.0000 mg | ORAL_TABLET | Freq: Every day | ORAL | Status: DC

## 2013-07-19 MED ORDER — ALPRAZOLAM 0.5 MG PO TABS: 0.5000 mg | ORAL_TABLET | Freq: Every evening | ORAL | Status: DC | PRN

## 2013-07-19 NOTE — Progress Notes (Signed)
Subjective:     Patient ID: Pamela [name], female   DOB: 03-25-1986, 28 y.o.   MRN: [id number]  HPI Pamela [name] is a 28 year old black female in complaining of being emotional and not sleeping. Had history of post partum depression in past x 2, does not feel depressed. Review of Systems See HPI Reviewed past medical,surgical, social and family history. Reviewed medications and allergies.     Objective:   Physical Exam BP 120/72  Ht 5\' 11"  (1.803 m)  Wt 306 lb (138.801 kg)  BMI 42.70 kg/m2  LMP 05/18/2013  Breastfeeding? NoPt says she is not suicidal just not her self, can't keep mind from racing.     Assessment:     Anxiety    Plan:     Rx zoloft 50 mg 1 daily #30 with 6 refills Rx xanax 0.5mg  #30 1 at hs prn no refills Follow up in 4 weeks Review handout on anxiety

## 2013-07-19 NOTE — Patient Instructions (Signed)
Generalized Anxiety Disorder Generalized anxiety disorder (GAD) is a mental disorder. It interferes with life functions, including relationships, work, and school. GAD is different from normal anxiety, which everyone experiences at some point in their lives in response to specific life events and activities. Normal anxiety actually helps us prepare for and get through these life events and activities. Normal anxiety goes away after the event or activity is over.  GAD causes anxiety that is not necessarily related to specific events or activities. It also causes excess anxiety in proportion to specific events or activities. The anxiety associated with GAD is also difficult to control. GAD can vary from mild to severe. People with severe GAD can have intense waves of anxiety with physical symptoms (panic attacks).  SYMPTOMS The anxiety and worry associated with GAD are difficult to control. This anxiety and worry are related to many life events and activities and also occur more days than not for 6 months or longer. People with GAD also have three or more of the following symptoms (one or more in children):  Restlessness.   Fatigue.  Difficulty concentrating.   Irritability.  Muscle tension.  Difficulty sleeping or unsatisfying sleep. DIAGNOSIS GAD is diagnosed through an assessment by your caregiver. Your caregiver will ask you questions aboutyour mood,physical symptoms, and events in your life. Your caregiver may ask you about your medical history and use of alcohol or drugs, including prescription medications. Your caregiver may also do a physical exam and blood tests. Certain medical conditions and the use of certain substances can cause symptoms similar to those associated with GAD. Your caregiver may refer you to a mental health specialist for further evaluation. TREATMENT The following therapies are usually used to treat GAD:   Medication Antidepressant medication usually is  prescribed for long-term daily control. Antianxiety medications may be added in severe cases, especially when panic attacks occur.   Talk therapy (psychotherapy) Certain types of talk therapy can be helpful in treating GAD by providing support, education, and guidance. A form of talk therapy called cognitive behavioral therapy can teach you healthy ways to think about and react to daily life events and activities.  Stress managementtechniques These include yoga, meditation, and exercise and can be very helpful when they are practiced regularly. A mental health specialist can help determine which treatment is best for you. Some people see improvement with one therapy. However, other people require a combination of therapies. Document Released: 09/14/2012 Document Reviewed: 09/14/2012 [facility] Patient Information 2014 [place], [place]. Follow up in 4 weeks Start zoloft 1 daily Xanax at HS

## 2013-07-30 ENCOUNTER — Emergency Department (HOSPITAL_COMMUNITY)
Admission: EM | Admit: 2013-07-30 | Discharge: 2013-07-30 | Disposition: A | Discharge status: Home / Self Care | Payer: Medicaid Other | Attending: Emergency Medicine | Admitting: Emergency Medicine

## 2013-07-30 ENCOUNTER — Encounter (HOSPITAL_COMMUNITY): Payer: Self-pay | Admitting: Emergency Medicine

## 2013-07-30 ENCOUNTER — Emergency Department (HOSPITAL_COMMUNITY): Payer: Medicaid Other

## 2013-07-30 DIAGNOSIS — J40 Bronchitis, not specified as acute or chronic: Secondary | ICD-10-CM | POA: Insufficient documentation

## 2013-07-30 DIAGNOSIS — J069 Acute upper respiratory infection, unspecified: Secondary | ICD-10-CM | POA: Insufficient documentation

## 2013-07-30 DIAGNOSIS — Z79899 Other long term (current) drug therapy: Secondary | ICD-10-CM | POA: Insufficient documentation

## 2013-07-30 DIAGNOSIS — F411 Generalized anxiety disorder: Secondary | ICD-10-CM | POA: Insufficient documentation

## 2013-07-30 DIAGNOSIS — Z91040 Latex allergy status: Secondary | ICD-10-CM | POA: Insufficient documentation

## 2013-07-30 DIAGNOSIS — F17200 Nicotine dependence, unspecified, uncomplicated: Secondary | ICD-10-CM | POA: Insufficient documentation

## 2013-07-30 DIAGNOSIS — Z8619 Personal history of other infectious and parasitic diseases: Secondary | ICD-10-CM | POA: Insufficient documentation

## 2013-07-30 MED ORDER — LORATADINE-PSEUDOEPHEDRINE ER 5-120 MG PO TB12: 1.0000 | ORAL_TABLET | Freq: Two times a day (BID) | ORAL | Status: DC

## 2013-07-30 MED ORDER — PROMETHAZINE-CODEINE 6.25-10 MG/5ML PO SYRP: 5.0000 mL | ORAL_SOLUTION | Freq: Four times a day (QID) | ORAL | Status: DC | PRN

## 2013-07-30 MED ORDER — KETOROLAC TROMETHAMINE 10 MG PO TABS
10.0000 mg | ORAL_TABLET | Freq: Once | ORAL | Status: AC
  Administered 2013-07-30: 10 mg via ORAL
  Filled 2013-07-30: qty 1

## 2013-07-30 MED ORDER — ALBUTEROL SULFATE HFA 108 (90 BASE) MCG/ACT IN AERS
2.0000 | INHALATION_SPRAY | RESPIRATORY_TRACT | Status: DC
  Administered 2013-07-30: 2 via RESPIRATORY_TRACT
  Filled 2013-07-30: qty 6.7

## 2013-07-30 MED ORDER — MELOXICAM 7.5 MG PO TABS: ORAL_TABLET | ORAL | Status: DC

## 2013-07-30 MED ORDER — HYDROCOD POLST-CHLORPHEN POLST 10-8 MG/5ML PO LQCR
5.0000 mL | Freq: Once | ORAL | Status: AC
  Administered 2013-07-30: 5 mL via ORAL
  Filled 2013-07-30: qty 5

## 2013-07-30 NOTE — ED Provider Notes (Signed)
CSN: [id number]     Arrival date & time 07/30/13  1202 History   First MD Initiated Contact with Patient 07/30/13 1405     Chief Complaint  Patient presents with  . Shortness of Breath     (Consider location/radiation/quality/duration/timing/severity/associated sxs/prior Treatment) HPI Comments: Patient is a 28 year old female who presents to the emergency department with 2-3 days of cough, congestion, and shortness of breath. The patient states she's been so much coughing that she is now sore in her chest and it hurts to breathe. She feels as though her" lungs hurt". She states that she didn't have any energy or stamina to keep up with her children as a result of these symptoms. She is unsure of temperature elevations, but states she has had an occasional chill. She is not coughing up any blood. She's not had any procedures or surgeries on the lungs. She presents at this time for evaluation and assistance with this problem.  The history is provided by the patient.    Past Medical History  Diagnosis Date  . Genital herpes   . Anxiety   . Tobacco abuse 01/10/2012  . Pregnant   . HSV-2 infection complicating pregnancy     Will suppress at 34 weeks. Don't discuss in front of FOB.  . History of abnormal Pap smear   . History of postpartum depression, currently pregnant   . Abnormal antenatal AFP screen     [place] and [place] was low risk.  . Mental disorder     hx pp depression now anxiety   Past Surgical History  Procedure Laterality Date  . Tonsillectomy    . Cholecystectomy  01/10/2012    Procedure: LAPAROSCOPIC CHOLECYSTECTOMY;  Surgeon: Dalia Heading, MD;  Location: [facility];  Service: General;  Laterality: N/A;  attempted Intraopertive cholangiogram  . Cesarean section  03/04/2011    Procedure: CESAREAN SECTION;  Surgeon: Lesly Dukes, MD;  Location: [facility];  Service: Gynecology;  Laterality: N/A;  primary of baby  boy at 69  . Cesarean section N/A 10/27/2012    Procedure: CESAREAN  SECTION;  Surgeon: Allie Bossier, MD;  Location: [facility];  Service: Obstetrics;  Laterality: N/A;   Family History  Problem Relation Age of Onset  . Nephrolithiasis Father   . Graves' disease Father   . Hypertension Other   . Cancer Other     colon   History  Substance Use Topics  . Smoking status: Current Every Day Smoker -- 0.10 packs/day for 5 years    Types: Cigarettes  . Smokeless tobacco: Never Used  . Alcohol Use: No   OB History   Grav Para Term Preterm Abortions TAB SAB Ect Mult Living   3 2 2  0 1 0 1 0 0 2     Review of Systems  Constitutional: Negative for activity change.       All ROS Neg except as noted in HPI  HENT: Positive for congestion. Negative for nosebleeds.   Eyes: Negative for photophobia and discharge.  Respiratory: Positive for cough and chest tightness. Negative for shortness of breath and wheezing.   Cardiovascular: Negative for chest pain and palpitations.  Gastrointestinal: Negative for abdominal pain and blood in stool.  Genitourinary: Negative for dysuria, frequency and hematuria.  Musculoskeletal: Negative for arthralgias, back pain and neck pain.  Skin: Negative.   Neurological: Negative for dizziness, seizures and speech difficulty.  Psychiatric/Behavioral: Negative for hallucinations and confusion. The patient is nervous/anxious.  Allergies  Ceftriaxone and Latex  Home Medications   Current Outpatient Rx  Name  Route  Sig  Dispense  Refill  . acyclovir (ZOVIRAX) 200 MG capsule   Oral   Take 200 mg by mouth 2 (two) times daily.         Marland Kitchen ALPRAZolam (XANAX) 0.5 MG tablet   Oral   Take 1 tablet (0.5 mg total) by mouth at bedtime as needed for anxiety.   30 tablet   0   . levonorgestrel (MIRENA) 20 MCG/24HR IUD   Intrauterine   1 each by Intrauterine route once. Placed 12/25/12         . sertraline (ZOLOFT) 50 MG tablet   Oral   Take 1 tablet (50 mg total) by mouth daily.   30 tablet   6    BP 131/73  Pulse 89   Temp(Src) 98.6 F (37 C) (Oral)  Resp 22  Ht 5\' 11"  (1.803 m)  Wt 303 lb (137.44 kg)  BMI 42.28 kg/m2  SpO2 97%  LMP 05/18/2013 Physical Exam  Nursing note and vitals reviewed. Constitutional: She is oriented to person, place, and time. She appears well-developed and well-nourished.  Non-toxic appearance.  HENT:  Head: Normocephalic.  Right Ear: Tympanic membrane and external ear normal.  Left Ear: Tympanic membrane and external ear normal.  Nasal congestion present.  Eyes: EOM and lids are normal. Pupils are equal, round, and reactive to light.  Neck: Normal range of motion. Neck supple. Carotid bruit is not present.  Cardiovascular: Normal rate, regular rhythm, normal heart sounds, intact distal pulses and normal pulses.   Pulmonary/Chest: Breath sounds normal. No respiratory distress.  Scattered wheezes and rhonchi.  Abdominal: Soft. Bowel sounds are normal. There is no tenderness. There is no guarding.  Musculoskeletal: Normal range of motion.  Lymphadenopathy:       Head (right side): No submandibular adenopathy present.       Head (left side): No submandibular adenopathy present.    She has no cervical adenopathy.  Neurological: She is alert and oriented to person, place, and time. She has normal strength. No cranial nerve deficit or sensory deficit.  Skin: Skin is warm and dry.  Psychiatric: She has a normal mood and affect. Her speech is normal.    ED Course  Procedures (including critical care time) Labs Review Labs Reviewed - No data to display Imaging Review Dg Chest 2 View  07/30/2013   CLINICAL DATA:  Chest pain, cough, shortness of Breath  EXAM: CHEST  2 VIEW  COMPARISON:  10/08/2011  FINDINGS: Cardiomediastinal silhouette is stable. No acute infiltrate or pleural effusion. No pulmonary edema. Bony thorax is unremarkable.  IMPRESSION: No active cardiopulmonary disease.   Electronically Signed   By: Natasha Mead M.D.   On: 07/30/2013 12:52    EKG  Interpretation  None  MDM Patient presents to the emergency department with complaint of shortness of breath, chest pain with cough, and generally not feeling well. The patient states that her children have been ill and now she was ill. She denies any hemoptysis or high fever. Chest x-ray is negative for any cardiopulmonary disease issues. Pulse oximetry is 97% on room air. Within normal limits by my interpretation.  The plan at this time is for the patient to use Claritin-D for congestion, Mobic 7.54 inflammation, and promethazine cough medication for cough. Patient is also given an albuterol inhaler, 2 puffs every 4 hours. Patient advised to see her primary physician, or return to  the emergency department if any changes, problems, or concerns.    Final diagnoses:  None    *I have reviewed nursing notes, vital signs, and all appropriate lab and imaging results for this patient.Kathie Dike**    Joziyah Roblero M Lorenia Hoston, PA-C 07/30/13 1558

## 2013-07-30 NOTE — ED Notes (Addendum)
Sob, cough for 2 days,  Diarrhea since last night.Nausea, no vomiting

## 2013-07-30 NOTE — Discharge Instructions (Signed)
Your x-ray is negative for pneumonia or other lung emergency. Your examination is consistent with bronchitis and upper respiratory infection. Please use your inhaler, 2 puffs every 4 hours. Please use Mobic 2 times daily with food. Please use promethazine cough medication every 6 hours for cough. Please use Claritin-D 2 times daily or every 12 hours for your congestion. Please increase water and juices. Wash hands frequently, use a mask until the symptoms have resolved. Bronchitis Bronchitis is swelling (inflammation) of the air tubes leading to your lungs (bronchi). This causes mucus and a cough. If the swelling gets bad, you may have trouble breathing. HOME CARE   Rest.  Drink enough fluids to keep your pee (urine) clear or pale yellow (unless you have a condition where you have to watch how much you drink).  Only take medicine as told by your doctor. If you were given antibiotic medicines, finish them even if you start to feel better.  Avoid smoke, irritating chemicals, and strong smells. These make the problem worse. Quit smoking if you smoke. This helps your lungs heal faster.  Use a cool mist humidifier. Change the water in the humidifier every day. You can also sit in the bathroom with hot shower running for 5 10 minutes. Keep the door closed.  See your health care provider as told.  Wash your hands often. GET HELP IF: Your problems do not get better after 1 week. GET HELP RIGHT AWAY IF:   Your fever gets worse.  You have chills.  Your chest hurts.  Your problems breathing get worse.  You have blood in your mucus.  You pass out (faint).  You feel lightheaded.  You have a bad headache.  You throw up (vomit) again and again. MAKE SURE YOU:  Understand these instructions.  Will watch your condition.  Will get help right away if you are not doing well or get worse. Document Released: 11/06/2007 Document Revised: 03/10/2013 Document Reviewed: 01/12/2013 [facility]  Patient Information 2014 [place], [place]LL[place].

## 2013-07-30 NOTE — ED Notes (Signed)
Complain of being SOB for two days 

## 2013-07-31 NOTE — ED Provider Notes (Signed)
Medical screening examination/treatment/procedure(s) were performed by non-physician practitioner and as supervising physician I was immediately available for consultation/collaboration.   EKG Interpretation None        Lyanne CoKevin M Janna Oak, MD 07/31/13 [phone]

## 2013-08-16 ENCOUNTER — Ambulatory Visit: Payer: Medicaid Other | Admitting: Adult Health

## 2013-08-17 ENCOUNTER — Ambulatory Visit: Payer: Medicaid Other | Admitting: Adult Health

## 2013-08-24 ENCOUNTER — Ambulatory Visit: Payer: Medicaid Other | Admitting: Adult Health

## 2013-09-17 IMAGING — CT CT ANGIO CHEST
2 of 7 series · 18 of 36 positions shown · IV contrast (omnipaque)
Comparison: Chest radiography 10/08/2011

CLINICAL DATA: Back pain and epigastric pain.  Pain with breathing
and movement.

CT ANGIOGRAPHY CHEST
TECHNIQUE: Multidetector CT imaging of the chest using the
standard protocol during bolus administration of intravenous
contrast. Multiplanar reconstructed images including MIPs were
obtained and reviewed to evaluate the vascular anatomy.
Contrast: 80mL OMNIPAQUE IOHEXOL 350 MG/ML SOLN

[Series 5: pe thins · axial · 0.61mm/px · z∈[-234,-51]mm · 17 of 207 slices shown]
[im 12/207  lung]
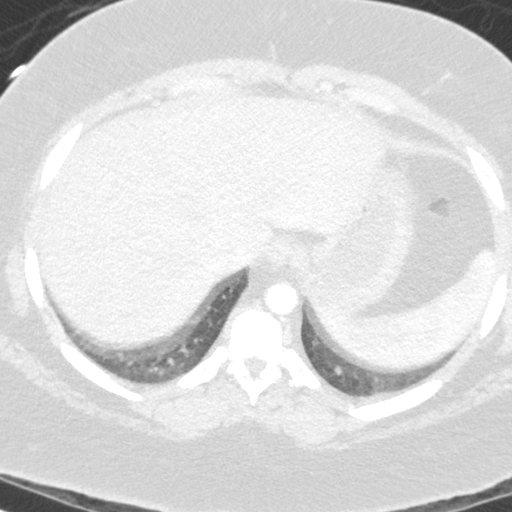
[im 23/207  mediastinal]
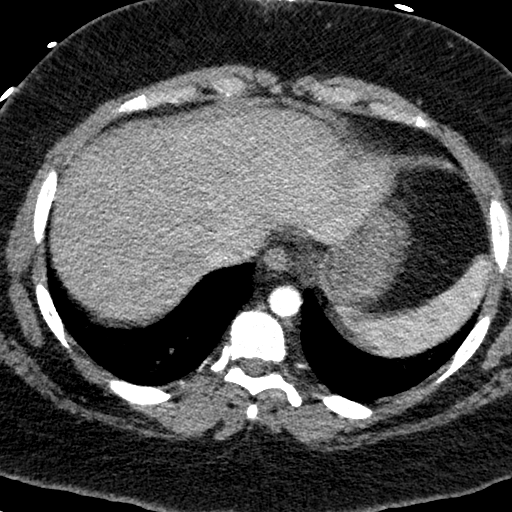
[im 35/207  lung]
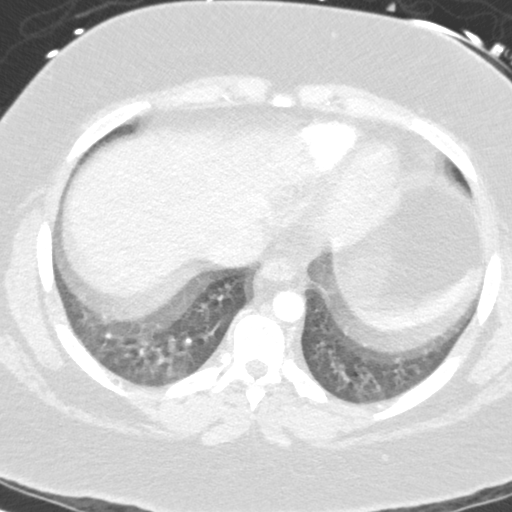
[im 46/207  mediastinal]
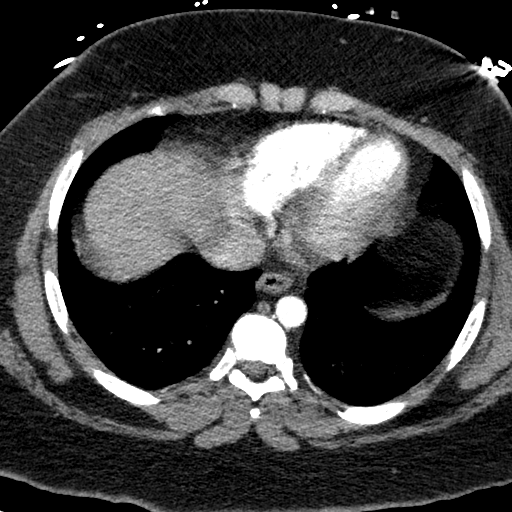
[im 58/207  lung]
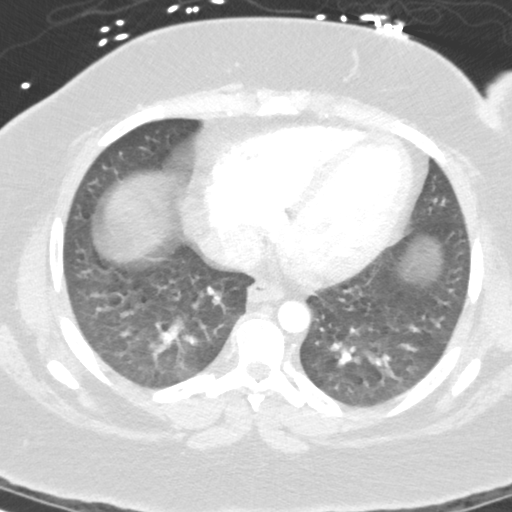
[im 69/207  mediastinal]
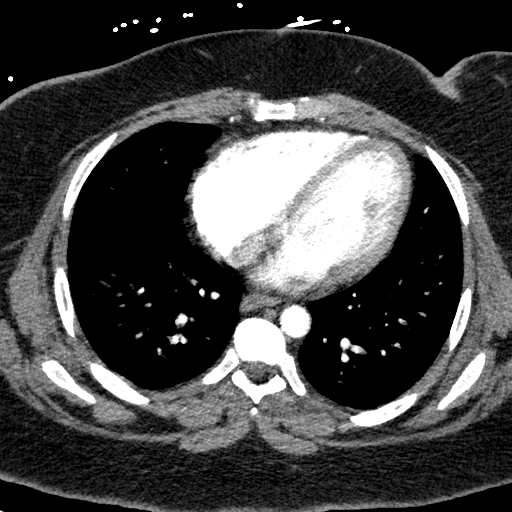
[im 81/207  lung]
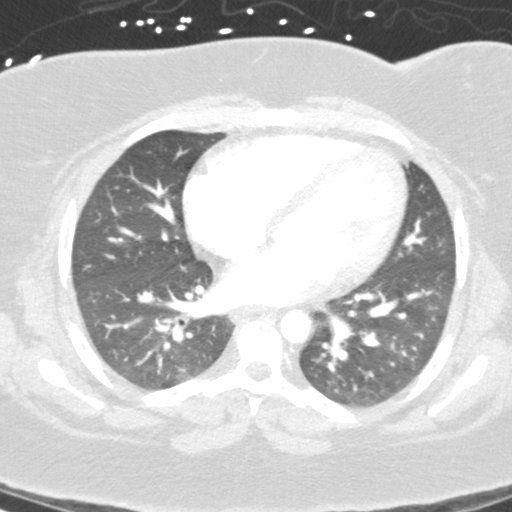
[im 92/207  mediastinal]
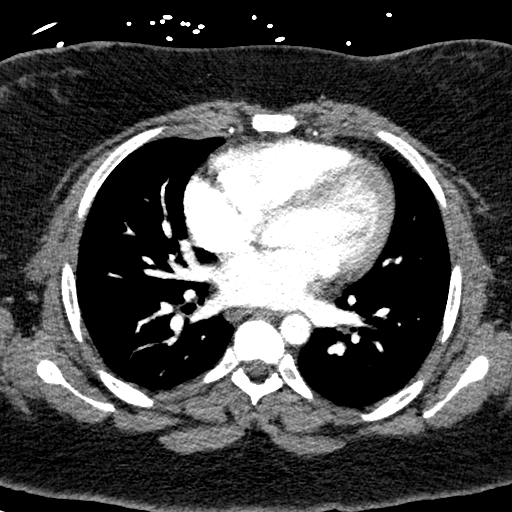
[im 104/207  lung]
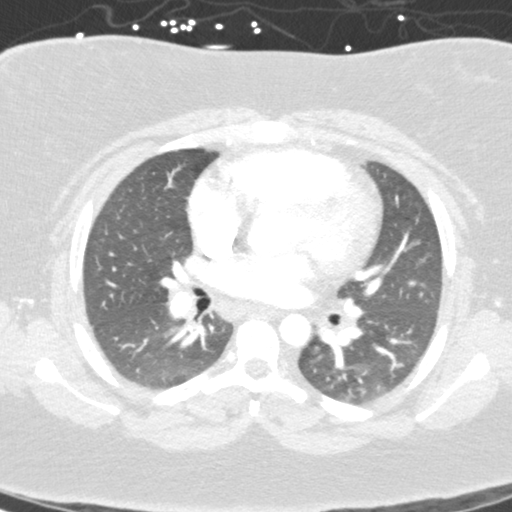
[im 115/207  mediastinal]
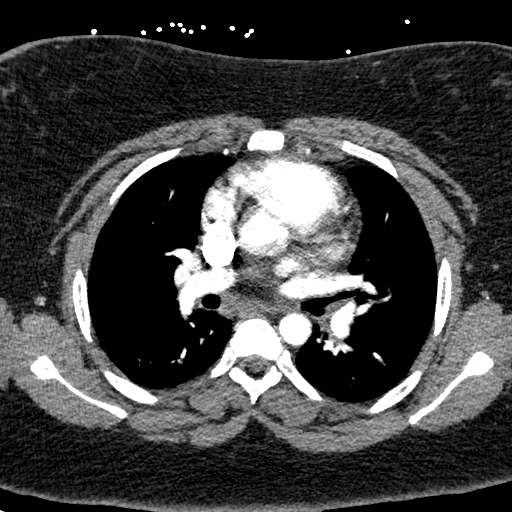
[im 126/207  lung]
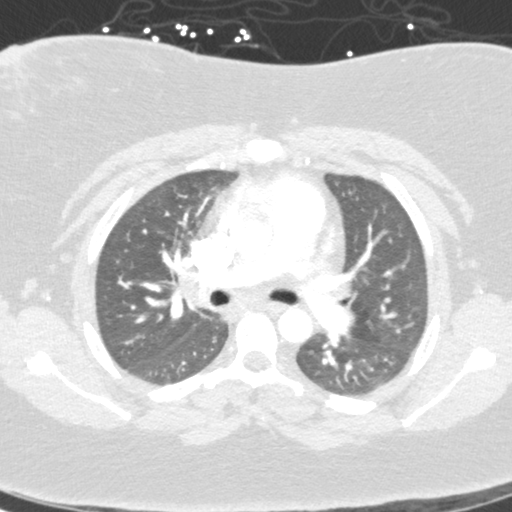
[im 138/207  mediastinal]
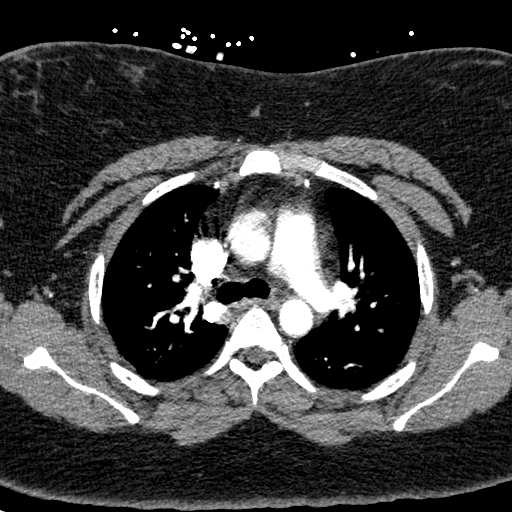
[im 149/207  lung]
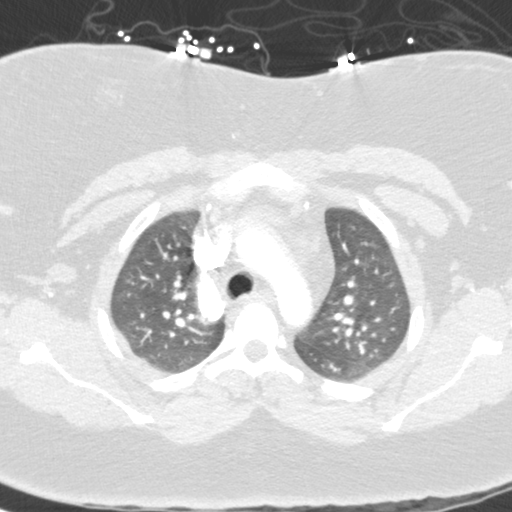
[im 161/207  mediastinal]
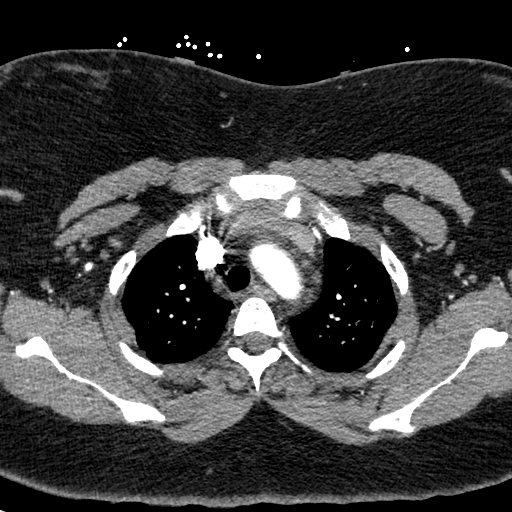
[im 172/207  lung]
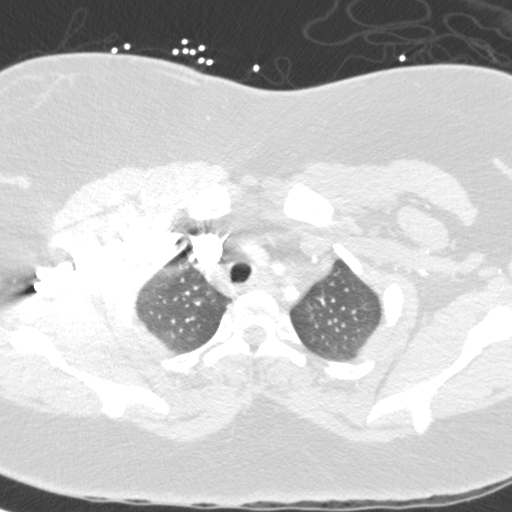
[im 184/207  mediastinal]
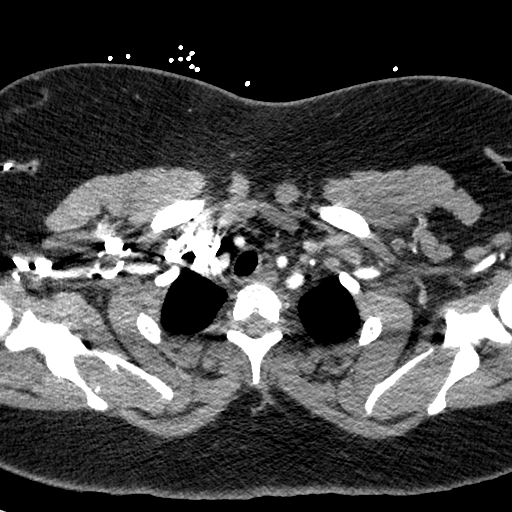
[im 195/207  lung]
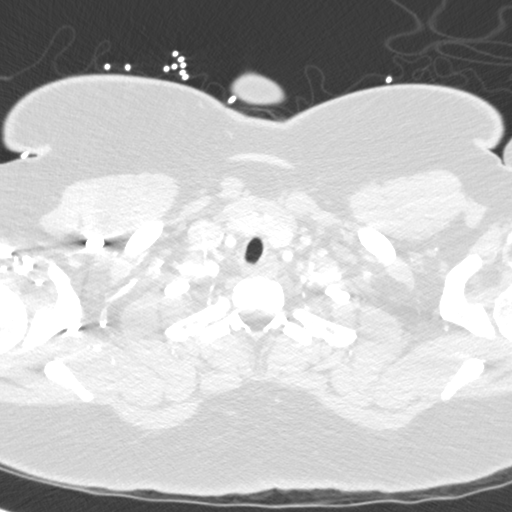

[mpr, coronals, coronal · coronal · 0.60mm/px · 1 of 95 slices shown]
[im 48/95  mediastinal]
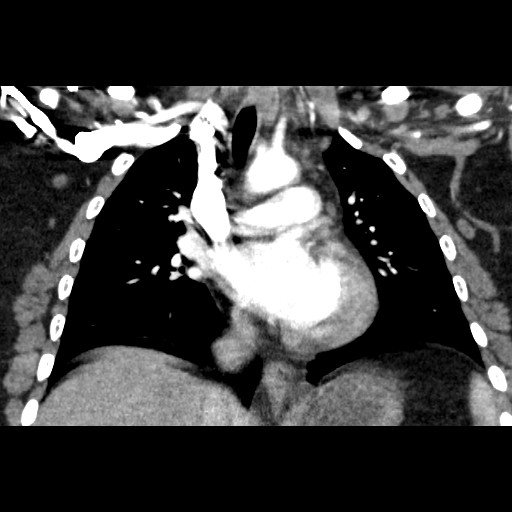

[18 of 36 positions shown; findings below may reference images not displayed]

FINDINGS: There is moderate motion degradation.  Allowing for that,
I think the aorta is normal.  No evidence of aneurysm or
dissection.  No evidence of mediastinal bleeding.  The pulmonary
arterial structures appear normal.  Heart size is normal.  No
pleural or pericardial fluid.  No mediastinal or hilar mass or
adenopathy.  Some normal size nodes are present.  Scans in the
upper abdomen are unremarkable.  Pulmonary parenchyma is clear and
normal.  No evidence of spinal fracture.
IMPRESSION: Normal examination.  No cause of pain identified.  Specifically, no
aortic pathology is visible.  There is moderate motion degradation,
but I do not think it is likely that any pathology is being
obscured.

## 2013-12-22 ENCOUNTER — Ambulatory Visit (INDEPENDENT_AMBULATORY_CARE_PROVIDER_SITE_OTHER): Payer: Medicaid Other | Admitting: Advanced Practice Midwife

## 2013-12-22 ENCOUNTER — Encounter: Payer: Self-pay | Admitting: Advanced Practice Midwife

## 2013-12-22 VITALS — BP 130/78 | Ht 70.5 in | Wt 295.5 lb

## 2013-12-22 DIAGNOSIS — Z113 Encounter for screening for infections with a predominantly sexual mode of transmission: Secondary | ICD-10-CM

## 2013-12-22 DIAGNOSIS — A6000 Herpesviral infection of urogenital system, unspecified: Secondary | ICD-10-CM

## 2013-12-22 MED ORDER — VALACYCLOVIR HCL 500 MG PO TABS: 500.0000 mg | ORAL_TABLET | Freq: Every day | ORAL | Status: DC

## 2013-12-22 MED ORDER — MEGESTROL ACETATE 40 MG PO TABS: ORAL_TABLET | ORAL | Status: DC

## 2013-12-22 NOTE — Patient Instructions (Signed)
I sent in a prescription for valtrex for you to take daily to prevent transmission of genital herpes.  It is up to you to decide whether or not you take it, but if you don't want to infect your new partner (although, as I said before, the MOST responsible thing to do would be for him to get screened for ALL STD's as well!), this is the only reliable way to do it!  The megagestrol can be taken as needed for bleeding--up to 3 a day (at the same time) for heavy bleeding, 1 a day for spotting.

## 2013-12-22 NOTE — Progress Notes (Signed)
[facility] Visit  Patient name: Pamela [name] Everingham MRN [id number]  Date of birth: [birth year]  CC & HPI:  Pamela [name] Salatino is a 28 y.o. African American female presenting today for STD screening.  She states that she recently got bloodwork at [facility] for HIV and syphillis, which was negative.  Her ex told her he was treated for a STD, but won't tell her which one. She does not want her current boyfriend (who is here with her) to know she has HSV.  I recommended in her discharge instructions to take prophylactic valtrex to prevent transmission.  I also recommended that the responsible thing for 2 people who want to have sex without condoms would be for BOTH people to get tested for all STD's.  He does not have insurance and does not seem motivated to get tested at the health department  Pertinent History Reviewed:  Medical & Surgical Hx:   Past Medical History  Diagnosis Date  . Genital herpes   . Anxiety   . Tobacco abuse 01/10/2012  . Pregnant   . HSV-2 infection complicating pregnancy     Will suppress at 34 weeks. Don't discuss in front of FOB.  . History of abnormal Pap smear   . History of postpartum depression, currently pregnant   . Abnormal antenatal AFP screen     [place] and [place] was low risk.  . Mental disorder     hx pp depression now anxiety   Past Surgical History  Procedure Laterality Date  . Tonsillectomy    . Cholecystectomy  01/10/2012    Procedure: LAPAROSCOPIC CHOLECYSTECTOMY;  Surgeon: Dalia HeadingMark A Jenkins, MD;  Location: [facility];  Service: General;  Laterality: N/A;  attempted Intraopertive cholangiogram  . Cesarean section  03/04/2011    Procedure: CESAREAN SECTION;  Surgeon: Lesly DukesKelly H. Leggett, MD;  Location: [facility];  Service: Gynecology;  Laterality: N/A;  primary of baby  boy at [id number]  . Cesarean section N/A 10/27/2012    Procedure: CESAREAN SECTION;  Surgeon: Allie BossierMyra C Dove, MD;  Location: [facility];  Service: Obstetrics;  Laterality: N/A;   Medications: Reviewed & Updated - see  associated section Social History: Reviewed -  reports that she has been smoking Cigarettes.  She has a .5 pack-year smoking history. She has never used smokeless tobacco.  Objective Findings:  Vitals: BP 130/78  Ht 5' 10.5" (1.791 m)  Wt 295 lb 8 oz (134.038 kg)  BMI 41.79 kg/m2  LMP 12/22/2013  Breastfeeding? No  Physical Examination: General appearance - alert, well appearing, and in no distress Mental status - alert, oriented to person, place, and time Abdomen - soft, nontender, nondistended, no masses or organomegaly Pelvic - normal external genitalia, vulva, vagina, cervix, uterus and adnexa, WET MOUNT done - results: negative for pathogens, normal epithelial cells  No results found for this or any previous visit (from the past 24 hour(s)).   Assessment & Plan:  A:   STD screen for Gc/CHL P:  Megace PRN bleeding 2/2 Mirena   F/U prn   CRESENZO-DISHMAN,Chase Knebel CNM 12/22/2013 3:29 PM

## 2013-12-23 ENCOUNTER — Telehealth: Payer: Self-pay | Admitting: Advanced Practice Midwife

## 2013-12-23 LAB — GC/CHLAMYDIA PROBE AMP
CT Probe RNA: NEGATIVE
GC Probe RNA: POSITIVE — AB

## 2013-12-23 MED ORDER — AZITHROMYCIN 500 MG PO TABS: 1000.0000 mg | ORAL_TABLET | Freq: Every day | ORAL | Status: DC

## 2013-12-23 NOTE — Telephone Encounter (Signed)
Pt to come for Rocephin 250mg  IM and rx for azithromycin 1g PO for pt and partner sent to pharmacy ([facility]  STD contact card filled out and left with nurse to give to pt and State form sent in

## 2013-12-24 ENCOUNTER — Ambulatory Visit: Payer: Medicaid Other | Admitting: Obstetrics & Gynecology

## 2013-12-24 DIAGNOSIS — A549 Gonococcal infection, unspecified: Secondary | ICD-10-CM | POA: Insufficient documentation

## 2013-12-24 MED ORDER — AZITHROMYCIN 500 MG PO TABS: ORAL_TABLET | ORAL | Status: DC

## 2013-12-24 NOTE — Progress Notes (Signed)
Patient ID: Pamela NottinghamJamie L [name], female   DOB: [birth year], 28 y.o.   MRN: [id number] Pt unable to receive Rocephin injection today for treatment of GC due to history of allergic reaction, shortness of breath, hives, itching. Per Dr.Eure will e-scribed alternative treatment to [facility] [place]. Pt aware to follow up with pharmacy.    Pt to take 2000 mg azithromycin for treatment for her GC(allergic to rocephin)

## 2014-01-18 ENCOUNTER — Other Ambulatory Visit: Payer: Self-pay | Admitting: Advanced Practice Midwife

## 2014-01-18 ENCOUNTER — Other Ambulatory Visit: Payer: Self-pay | Admitting: Obstetrics & Gynecology

## 2014-04-04 ENCOUNTER — Encounter: Payer: Self-pay | Admitting: Advanced Practice Midwife

## 2014-06-27 ENCOUNTER — Other Ambulatory Visit: Payer: Self-pay | Admitting: Obstetrics & Gynecology

## 2014-06-28 ENCOUNTER — Emergency Department (HOSPITAL_COMMUNITY)
Admission: EM | Admit: 2014-06-28 | Discharge: 2014-06-28 | Disposition: A | Discharge status: Home / Self Care | Payer: Medicaid Other | Attending: Emergency Medicine | Admitting: Emergency Medicine

## 2014-06-28 ENCOUNTER — Encounter (HOSPITAL_COMMUNITY): Payer: Self-pay | Admitting: Emergency Medicine

## 2014-06-28 DIAGNOSIS — Z91040 Latex allergy status: Secondary | ICD-10-CM | POA: Diagnosis not present

## 2014-06-28 DIAGNOSIS — Z8619 Personal history of other infectious and parasitic diseases: Secondary | ICD-10-CM | POA: Diagnosis not present

## 2014-06-28 DIAGNOSIS — Z720 Tobacco use: Secondary | ICD-10-CM | POA: Insufficient documentation

## 2014-06-28 DIAGNOSIS — Z3202 Encounter for pregnancy test, result negative: Secondary | ICD-10-CM | POA: Diagnosis not present

## 2014-06-28 DIAGNOSIS — R300 Dysuria: Secondary | ICD-10-CM | POA: Diagnosis not present

## 2014-06-28 DIAGNOSIS — Z791 Long term (current) use of non-steroidal anti-inflammatories (NSAID): Secondary | ICD-10-CM | POA: Diagnosis not present

## 2014-06-28 DIAGNOSIS — Z202 Contact with and (suspected) exposure to infections with a predominantly sexual mode of transmission: Secondary | ICD-10-CM | POA: Diagnosis not present

## 2014-06-28 DIAGNOSIS — F419 Anxiety disorder, unspecified: Secondary | ICD-10-CM | POA: Diagnosis not present

## 2014-06-28 DIAGNOSIS — Z792 Long term (current) use of antibiotics: Secondary | ICD-10-CM | POA: Diagnosis not present

## 2014-06-28 DIAGNOSIS — Z79899 Other long term (current) drug therapy: Secondary | ICD-10-CM | POA: Diagnosis not present

## 2014-06-28 HISTORY — DX: Gonococcal infection, unspecified: A54.9

## 2014-06-28 LAB — URINALYSIS, ROUTINE W REFLEX MICROSCOPIC
BILIRUBIN URINE: NEGATIVE
GLUCOSE, UA: NEGATIVE mg/dL
KETONES UR: NEGATIVE mg/dL
Leukocytes, UA: NEGATIVE
Nitrite: NEGATIVE
PH: 5.5 (ref 5.0–8.0)
PROTEIN: NEGATIVE mg/dL
Urobilinogen, UA: 0.2 mg/dL (ref 0.0–1.0)

## 2014-06-28 LAB — URINE MICROSCOPIC-ADD ON

## 2014-06-28 LAB — PREGNANCY, URINE: PREG TEST UR: NEGATIVE

## 2014-06-28 MED ORDER — METRONIDAZOLE 500 MG PO TABS: 500.0000 mg | ORAL_TABLET | Freq: Two times a day (BID) | ORAL | Status: DC

## 2014-06-28 MED ORDER — AZITHROMYCIN 1 G PO PACK
2.0000 g | PACK | Freq: Once | ORAL | Status: AC
  Administered 2014-06-28: 2 g via ORAL
  Filled 2014-06-28: qty 2

## 2014-06-28 NOTE — ED Provider Notes (Signed)
CSN: [id number]     Arrival date & time 06/28/14  0919 History   First MD Initiated Contact with Patient 06/28/14 [phone]     Chief Complaint  Patient presents with  . Dysuria  . Exposure to STD      HPI  Pt was seen at 1005. Per pt, c/o gradual onset and persistence of constant dysuria for the past 1 week. Pt states she "just found out I was exposed to gonorrhea and chlamydia" and is requesting "to get treated for both."  Pt denies pelvic pain, no flank pain, no hematuria, no fevers, no rash.    Past Medical History  Diagnosis Date  . Genital herpes   . Anxiety   . Tobacco abuse 01/10/2012  . Pregnant   . HSV-2 infection complicating pregnancy     Will suppress at 34 weeks. Don't discuss in front of FOB.  . History of abnormal Pap smear   . History of postpartum depression, currently pregnant   . Abnormal antenatal AFP screen     US and [place] was low risk.  . Mental disorder     hx pp depression now anxiety  . Gonorrhea    Past Surgical History  Procedure Laterality Date  . Tonsillectomy    . Cholecystectomy  01/10/2012    Procedure: LAPAROSCOPIC CHOLECYSTECTOMY;  Surgeon: Dalia HeadingMark A Jenkins, MD;  Location: [facility];  Service: General;  Laterality: N/A;  attempted Intraopertive cholangiogram  . Cesarean section  03/04/2011    Procedure: CESAREAN SECTION;  Surgeon: Lesly DukesKelly H. Leggett, MD;  Location: [facility];  Service: Gynecology;  Laterality: N/A;  primary of baby  boy at [address]30  . Cesarean section N/A 10/27/2012    Procedure: CESAREAN SECTION;  Surgeon: Allie BossierMyra C Dove, MD;  Location: [facility];  Service: Obstetrics;  Laterality: N/A;   Family History  Problem Relation Age of Onset  . Nephrolithiasis Father   . Graves' disease Father   . Hypertension Other   . Cancer Other     colon   History  Substance Use Topics  . Smoking status: Current Every Day Smoker -- 0.10 packs/day for 5 years    Types: Cigarettes  . Smokeless tobacco: Never Used  . Alcohol Use: No   OB History    Gravida  Para Term Preterm AB TAB SAB Ectopic Multiple Living   3 2 2  0 1 0 1 0 0 2     Review of Systems ROS: Statement: All systems negative except as marked or noted in the HPI; Constitutional: Negative for fever and chills. ; ; Eyes: Negative for eye pain, redness and discharge. ; ; ENMT: Negative for ear pain, hoarseness, nasal congestion, sinus pressure and sore throat. ; ; Cardiovascular: Negative for chest pain, palpitations, diaphoresis, dyspnea and peripheral edema. ; ; Respiratory: Negative for cough, wheezing and stridor. ; ; Gastrointestinal: Negative for nausea, vomiting, diarrhea, abdominal pain, blood in stool, hematemesis, jaundice and rectal bleeding. . ; ; Genitourinary: +dysuria. Negative for flank pain and hematuria. ; ; GYN:  No pelvic pain, no vaginal bleeding, no vaginal discharge, no vulvar pain. ;; Musculoskeletal: Negative for back pain and neck pain. Negative for swelling and trauma.; ; Skin: Negative for pruritus, rash, abrasions, blisters, bruising and skin lesion.; ; Neuro: Negative for headache, lightheadedness and neck stiffness. Negative for weakness, altered level of consciousness , altered mental status, extremity weakness, paresthesias, involuntary movement, seizure and syncope.      Allergies  Ceftriaxone and Latex  Home Medications  Prior to Admission medications   Medication Sig Start Date End Date Taking? Authorizing Provider  acyclovir (ZOVIRAX) 200 MG capsule Take 200 mg by mouth 2 (two) times daily.   Yes Historical Provider, MD  sertraline (ZOLOFT) 50 MG tablet Take 1 tablet (50 mg total) by mouth daily. 07/19/13  Yes Adline Potter, NP  ALPRAZolam Prudy Feeler) 0.5 MG tablet Take 1 tablet (0.5 mg total) by mouth at bedtime as needed for anxiety. Patient not taking: Reported on 06/28/2014 07/19/13   Adline Potter, NP  azithromycin (ZITHROMAX) 500 MG tablet TAKE ALL 4 TABLETS AT ONCE Patient not taking: Reported on 06/28/2014 06/27/14   Lazaro Arms, MD   levonorgestrel (MIRENA) 20 MCG/24HR IUD 1 each by Intrauterine route once. Placed 12/25/12    Historical Provider, MD  loratadine-pseudoephedrine (CLARITIN-D 12 HOUR) 5-120 MG per tablet Take 1 tablet by mouth 2 (two) times daily. Patient not taking: Reported on 06/28/2014 07/30/13   Kathie Dike, PA-C  megestrol (MEGACE) 40 MG tablet Take 3/day (at the same time) for 5 days; 2/day for 5 days, then 1/day PO prn bleeding Patient not taking: Reported on 06/28/2014 12/22/13   Jacklyn Shell, CNM  meloxicam (MOBIC) 7.5 MG tablet 1 po bid with food Patient not taking: Reported on 06/28/2014 07/30/13   Kathie Dike, PA-C  promethazine-codeine (PHENERGAN WITH CODEINE) 6.25-10 MG/5ML syrup Take 5 mLs by mouth every 6 (six) hours as needed for cough. Patient not taking: Reported on 06/28/2014 07/30/13   Kathie Dike, PA-C  valACYclovir (VALTREX) 500 MG tablet Take 1 tablet (500 mg total) by mouth daily. Patient not taking: Reported on 06/28/2014 12/22/13   Jacklyn Shell, CNM   BP 118/63 mmHg  Pulse 85  Temp(Src) 98.1 F (36.7 C) (Oral)  Resp 16  Ht  (1.803 m)  Wt 290 lb (131.543 kg)  BMI 40.46 kg/m2  SpO2 99%  LMP 03/28/2014 Physical Exam 1010: Physical examination:  Nursing notes reviewed; Vital signs and O2 SAT reviewed;  Constitutional: Well developed, Well nourished, Well hydrated, In no acute distress; Head:  Normocephalic, atraumatic; Eyes: EOMI, PERRL, No scleral icterus; ENMT: Mouth and pharynx normal, Mucous membranes moist; Neck: Supple, Full range of motion, No lymphadenopathy; Cardiovascular: Regular rate and rhythm, No murmur, rub, or gallop; Respiratory: Breath sounds clear & equal bilaterally, No rales, rhonchi, wheezes.  Speaking full sentences with ease, Normal respiratory effort/excursion; Chest: Nontender, Movement normal; Abdomen: Soft, Nontender, Nondistended, Normal bowel sounds; Genitourinary: No CVA tenderness; Extremities: Pulses normal, No  tenderness, No edema, No calf edema or asymmetry.; Neuro: AA&Ox3, Major CN grossly intact.  Speech clear. No gross focal motor or sensory deficits in extremities. Climbs on and off stretcher easily by herself. Gait steady.; Skin: Color normal, Warm, Dry.   ED Course  Procedures     EKG Interpretation None      MDM  MDM Reviewed: previous chart, nursing note and vitals Interpretation: labs     Results for orders placed or performed during the hospital encounter of 06/28/14  Pregnancy, urine  Result Value Ref Range   Preg Test, Ur NEGATIVE NEGATIVE  Urinalysis, Routine w reflex microscopic  Result Value Ref Range   Color, Urine YELLOW YELLOW   APPearance CLEAR CLEAR   Specific Gravity, Urine >1.030 (H) 1.005 - 1.030   pH 5.5 5.0 - 8.0   Glucose, UA NEGATIVE NEGATIVE mg/dL   Hgb urine dipstick TRACE (A) NEGATIVE   Bilirubin Urine NEGATIVE NEGATIVE   Ketones, ur NEGATIVE NEGATIVE  mg/dL   Protein, ur NEGATIVE NEGATIVE mg/dL   Urobilinogen, UA 0.2 0.0 - 1.0 mg/dL   Nitrite NEGATIVE NEGATIVE   Leukocytes, UA NEGATIVE NEGATIVE  Urine microscopic-add on  Result Value Ref Range   RBC / HPF 3-6 <3 RBC/hpf    1030:  NT abd/pelvis on exam. Will tx for STD exposure. Dx and testing d/w pt.  Questions answered.  Verb understanding, agreeable to d/c home with outpt f/u.     Samuel Jester, DO 07/02/14 [phone]

## 2014-06-28 NOTE — Discharge Instructions (Signed)
°Emergency Department Resource Guide °1) Find a Doctor and Pay Out of Pocket °Although you won't have to find out who is covered by your insurance plan, it is a good idea to ask around and get recommendations. You will then need to call the office and see if the doctor you have chosen will accept you as a new patient and what types of options they offer for patients who are self-pay. Some doctors offer discounts or will set up payment plans for their patients who do not have insurance, but you will need to ask so you aren't surprised when you get to your appointment. ° °2) Contact Your Local Health Department °Not all health departments have doctors that can see patients for sick visits, but many do, so it is worth a call to see if yours does. If you don't know where your local health department is, you can check in your phone book. The CDC also has a tool to help you locate your state's health department, and many state websites also have listings of all of their local health departments. ° °3) Find a Walk-in Clinic °If your illness is not likely to be very severe or complicated, you may want to try a walk in clinic. These are popping up all over the country in pharmacies, drugstores, and shopping centers. They're usually staffed by nurse practitioners or physician assistants that have been trained to treat common illnesses and complaints. They're usually fairly quick and inexpensive. However, if you have serious medical issues or chronic medical problems, these are probably not your best option. ° °No Primary Care Doctor: °- Call Health Connect at  832-8000 - they can help you locate a primary care doctor that  accepts your insurance, provides certain services, etc. °- Physician Referral Service- 1-800-533-3463 ° °Chronic Pain Problems: °Organization         Address  Phone   Notes  °[facility]  ([phone]) [phone] Patients need to be referred by their primary care doctor.  ° °Medication  Assistance: °Organization         Address  Phone   Notes  °[place] [facility] [address] °[place], [place] [address] (336) 641-8030 --Must be a resident of [place] °-- Must have NO insurance coverage whatsoever (no Medicaid/ Medicare, etc.) °-- The pt. MUST have a primary care doctor that directs their care regularly and follows them in the community °  °[facility]  (866) 331-1348   °[employer]  (888) 892-1162   ° °Agencies that provide inexpensive medical care: °Organization         Address  Phone   Notes  °[facility]  (336) 832-8035   °[facility]    (336) 832-7272   °[facility] [address] °[place], [place] [address] (336) 832-4777   °[facility] [address], °[place] (336) 271-4999   °[facility]    (336) 373-0678   °[facility]    (336) 272-1050   °[facility] ° [address], [place] Phone:  (336) 832-4444, Fax:  (336) 832-4440 Hours of Operation:  9 am - 6 pm, M-F.  Also accepts Medicaid/Medicare and self-pay.  °[facility] ° [address], [place] Phone: (336) [phone], Fax: (336) [phone]. Hours of Operation:  8:30 am - 5:30 pm, M-F.  Also accepts Medicaid and self-pay.  °[facility] [place] [address], [place] Phone: (336) 878-6027   °[facility] [address], [place], [place] (336)723-1848, Ext. 123 Mondays & Thursdays: 7-9 AM.  First 15 patients are seen on a first come, first serve basis. °  ° °Medicaid-accepting [place] Providers: ° °Organization         Address  Phone   Notes  °[facility] [address], [place] (336) 641-2100 Also accepts self-pay patients.  °[facility] [address], [place] ° (336) 856-9996   °[facility] [address], [place]  (336) 288-8857   °[facility] [address] [place] [address], [place] (336) 299-7000   °Veita Bland [address], [place]  ° (336) 373-1557 Only accepts [place] Access Medicaid patients after they have their name applied to their card.  ° °Self-Pay (no insurance) in [place]: ° °Organization         Address  Phone   Notes  °Sickle Cell Patients, [facility] [address], [place] (336) 832-1970   °[facility] [address], [place] (336) 832-4400   °[facility] ° [address], [place] (336) 992-4800   °[facility]/Dr. Osei-Bonsu ° [address] [place] [address], [place] or [address], [place] (336) 841-8500 Phone number for both [place] and [place] locations is the same.  °[facility] [address], [place] (336) 299-0000   °[facility] [address] [place] [address], [place] or [address] (336) 852-7530 °(336) 878-2260   °[facility] [address], [place] (336) 350-1642, phone; (336) 294-5005, fax Sees patients 1st and 3rd Saturday of every month.  Must not qualify for public or private insurance (i.e. Medicaid, Medicare, [place] Health Choice, Veterans' Benefits) • Household income should be no more than 200% of the poverty level •The clinic cannot treat you if you are pregnant or think you are pregnant • Sexually transmitted diseases are not treated at the clinic.  ° ° °Dental Care: °Organization         Address  Phone  Notes  °[place] [facility] [address], [place] (336) 641-6152 Accepts children up to age 21 who are enrolled in Medicaid or [place] Health Choice; pregnant women with a Medicaid card; and children who have applied for Medicaid or [place] Health Choice, but were declined, whose parents can pay a reduced fee at time of service.  °[place]  [facility] [place]  [address], [place] (336) 641-7733 Accepts children up to age 21 who are enrolled in Medicaid or [place] Health Choice; pregnant women with a Medicaid card; and children who have applied for Medicaid or [place] Health Choice, but were declined, whose parents can pay a reduced fee at time of service.  °[facility] ° [address], [place] (336) 641-4533 Patients are seen by appointment only. Walk-ins are not accepted. [facility] will see patients 18 years of age and older. °Monday - Tuesday (8am-5pm) °Most Wednesdays (8:30-5pm) °$30 per visit, cash only  °[facility] ° [address], [place] (336) 641-4533 Patients are seen by appointment only. Walk-ins are not accepted. [facility] will see patients 18 years of age and older. °One   Wednesday Evening (Monthly: Volunteer Based).  $30 per visit, cash only  °[facility]  (919) [phone] for adults; Children under age 4, call Graduate Pediatric Dentistry at ([phone]) [phone]. Children aged 4-14, please call ([phone]) [phone] to request a pediatric application. ° Dental services are provided in all areas of dental care including fillings, crowns and bridges, complete and partial dentures, implants, gum treatment, root canals, and extractions. Preventive care is also provided. Treatment is provided to both adults and children. °Patients are selected via a lottery and there is often a waiting list. °  °[facility] [address], °[place] ° (336) 763-8833 [email or web address] °  °[facility] [address], [place], [place] (336)723-1848, Ext. 123 Second and Fourth Thursday of each month, opens at 6:30 AM; Clinic ends at 9 AM.  Patients are seen on a first-come first-served basis, and a limited number are seen during each clinic.  ° °[facility] ° [address], [place], [place] (336) 723-7904    Eligibility Requirements °You must have lived in [place], [place], or [place] counties for at least the last three months. °  You cannot be eligible for state or federal sponsored healthcare insurance, including Veterans Administration, Medicaid, or Medicare. °  You generally cannot be eligible for healthcare insurance through your employer.  °  How to apply: °Eligibility screenings are held every Tuesday and Wednesday afternoon from 1:00 pm until 4:00 pm. You do not need an appointment for the interview!  °[facility] [address], [place], [place] 336-631-2330   °[facility]  336-342-8273   °[facility]  336-703-3100   °[facility]  336-570-6415   ° °Behavioral Health Resources in the Community: °Intensive Outpatient Programs °Organization         Address  Phone  Notes  °[facility] [address], [place], [place] 336-878-6098   °[facility] [address], [place], [place] 336-832-9800   °[facility] [address], [place], [place] ° 336-882-2125   °[facility] [address],  °[place], [place] 1-800-853-5163 or 336-641-4981   °Substance Abuse Resources °Organization         Address  Phone  Notes  °[facility]  336-882-2125   °[facility]  336-784-9470   °[facility]  336-285-9073   °[facility]  336-845-3988   °Residential & Outpatient Substance Abuse Program  1-800-659-3381   °Psychological Services °Organization         Address  Phone  Notes  °[facility]  336- 832-9600   °[facility]  336- 378-7881   °[facility] [address], [place] 1-800-853-5163 or 336-641-4981   ° °Mobile Crisis Teams °Organization         Address  Phone  Notes  °[facility], Mobile Crisis Care Unit  1-877-626-1772   °Assertive °[facility] ° [address]  [place], [place] 336-834-9664   °Sharon DeEsch [address] °[place] 336-554-5454   ° °Self-Help/Support Groups °Organization         Address  Phone             Notes  °[facility] of  - variety of support groups  336- 373-1402 Call for more information  °Narcotics Anonymous (NA), [facility] [address], °[place]  2 meetings at this location  ° °  Residential Treatment Programs Organization         Address  Phone  Notes  [facility] [address],    [place]  [phone]   [facility]  [address], [place] [phone], [place], [place] [phone]   [facility] [address], [place] [phone] Admissions: 8am-3pm M-F  [facility] [address]    [place], [place] 841-324-4010([phone]   [facility] [address], [place], [place] [phone]([phone]   [facility] [address]  [place], [place] [phone]   [facility] Intensive Outpatient [address], [place], [place] [phone]   [facility] ([facility] [address]  [place], [place] [phone] or [phone]   [facility] [address] [place], [place] [phone]([phone])[phone] Accepts Medicaid  [facility] [address]  [place] [phone] Substance Abuse/Addiction Treatment   [facility] Organization         Address  Phone  Notes  [facility]  409-414-7929(888) (506)411-3234   Angie FavaJulie Brannon, PhD [address], [address] [place], [place]   [phone](336) ([phone])030-1024 or [phone](336) ([phone]   [facility]   [address] [place], [place] [phone](336) [phone]   [facility] [address], [place], [place] [phone](336) [phone] Insurance/Medicaid/sponsorship through [facility]  [facility] [address]                                    [place], [place] [phone](336) [phone] Therapy/tele-psych/case    [facility] [address]   [place], [place] [phone](336) [phone]    Dr. Lolly MustacheArfeen  [phone]) [phone]   [facility]  [employer] [facility] 1) [address], [place] 2) [address], [place] 3)  [address], [place] [phone](336) [phone](336) [phone]  [phone](336) [phone]   [facility] Child [facility] Hotline (605) 576-1633(336) (217) 249-4079 or 7323015591(336) [phone] (After Hours)      Take the prescription as directed.  You were treated empirically today with antibiotics for both gonorrhea and chlamydia while in the Emergency Department.  Call your regular OB/GYN doctor today to schedule a follow up appointment within the next week.  Return to the Emergency Department immediately if worsening.

## 2014-06-28 NOTE — ED Notes (Signed)
Pt reports exposure to STD and symptoms of UTI.

## 2015-01-28 ENCOUNTER — Other Ambulatory Visit: Payer: Self-pay | Admitting: Advanced Practice Midwife

## 2015-01-31 ENCOUNTER — Telehealth: Payer: Self-pay | Admitting: *Deleted

## 2015-01-31 NOTE — Telephone Encounter (Signed)
Pt is requesting a refill on [facility] [place], last office visit here was July 2015.  Please advise.

## 2015-02-01 MED ORDER — VALACYCLOVIR HCL 500 MG PO TABS: 500.0000 mg | ORAL_TABLET | Freq: Every day | ORAL | Status: DC

## 2015-02-01 NOTE — Telephone Encounter (Signed)
Refilled valtrex  

## 2015-02-02 ENCOUNTER — Telehealth: Payer: Self-pay | Admitting: Advanced Practice Midwife

## 2015-02-02 NOTE — Telephone Encounter (Signed)
Pt informed [facility] [place].

## 2015-04-10 ENCOUNTER — Ambulatory Visit: Payer: Medicaid Other

## 2015-06-08 ENCOUNTER — Emergency Department (HOSPITAL_COMMUNITY)
Admission: EM | Admit: 2015-06-08 | Discharge: 2015-06-08 | Disposition: A | Discharge status: Home / Self Care | Payer: Medicaid Other | Attending: Emergency Medicine | Admitting: Emergency Medicine

## 2015-06-08 ENCOUNTER — Encounter (HOSPITAL_COMMUNITY): Payer: Self-pay

## 2015-06-08 DIAGNOSIS — Z79899 Other long term (current) drug therapy: Secondary | ICD-10-CM | POA: Insufficient documentation

## 2015-06-08 DIAGNOSIS — Z8619 Personal history of other infectious and parasitic diseases: Secondary | ICD-10-CM | POA: Diagnosis not present

## 2015-06-08 DIAGNOSIS — J029 Acute pharyngitis, unspecified: Secondary | ICD-10-CM | POA: Diagnosis present

## 2015-06-08 DIAGNOSIS — J069 Acute upper respiratory infection, unspecified: Secondary | ICD-10-CM | POA: Insufficient documentation

## 2015-06-08 DIAGNOSIS — Z792 Long term (current) use of antibiotics: Secondary | ICD-10-CM | POA: Diagnosis not present

## 2015-06-08 DIAGNOSIS — F419 Anxiety disorder, unspecified: Secondary | ICD-10-CM | POA: Diagnosis not present

## 2015-06-08 DIAGNOSIS — F17210 Nicotine dependence, cigarettes, uncomplicated: Secondary | ICD-10-CM | POA: Insufficient documentation

## 2015-06-08 DIAGNOSIS — Z91040 Latex allergy status: Secondary | ICD-10-CM | POA: Insufficient documentation

## 2015-06-08 MED ORDER — IBUPROFEN 800 MG PO TABS: 800.0000 mg | ORAL_TABLET | Freq: Three times a day (TID) | ORAL | Status: DC

## 2015-06-08 MED ORDER — LORATADINE-PSEUDOEPHEDRINE ER 5-120 MG PO TB12: 1.0000 | ORAL_TABLET | Freq: Two times a day (BID) | ORAL | Status: DC

## 2015-06-08 NOTE — ED Notes (Addendum)
Pt reports sore throat and ears itching x 2 days. Nonproductive cough today and c/o hoarseness.

## 2015-06-08 NOTE — ED Provider Notes (Signed)
CSN: [id number]     Arrival date & time 06/08/15  1020 History   First MD Initiated Contact with Patient 06/08/15 1039     Chief Complaint  Patient presents with  . Sore Throat     (Consider location/radiation/quality/duration/timing/severity/associated sxs/prior Treatment) Patient is a 30 y.o. female presenting with pharyngitis. The history is provided by the patient.  Sore Throat This is a new problem. The current episode started in the past 7 days. The problem occurs intermittently. The problem has been unchanged. Associated symptoms include congestion and a sore throat. Pertinent negatives include no chills, fever, myalgias, nausea, rash or vomiting. Associated symptoms comments: Ears itching. Nothing aggravates the symptoms. She has tried nothing for the symptoms. The treatment provided no relief.    Past Medical History  Diagnosis Date  . Genital herpes   . Anxiety   . Tobacco abuse 01/10/2012  . Pregnant   . HSV-2 infection complicating pregnancy     Will suppress at 34 weeks. Don't discuss in front of FOB.  . History of abnormal Pap smear   . History of postpartum depression, currently pregnant   . Abnormal antenatal AFP screen     [place] was low risk.  . Mental disorder     hx pp depression now anxiety  . Gonorrhea    Past Surgical History  Procedure Laterality Date  . Tonsillectomy    . Cholecystectomy  01/10/2012    Procedure: LAPAROSCOPIC CHOLECYSTECTOMY;  Surgeon: Dalia Heading, MD;  Location: [facility];  Service: General;  Laterality: N/A;  attempted Intraopertive cholangiogram  . Cesarean section  03/04/2011    Procedure: CESAREAN SECTION;  Surgeon: Lesly Dukes, MD;  Location: [facility];  Service: Gynecology;  Laterality: N/A;  primary of baby  boy at 45  . Cesarean section N/A 10/27/2012    Procedure: CESAREAN SECTION;  Surgeon: Allie Bossier, MD;  Location: [facility];  Service: Obstetrics;  Laterality: N/A;   Family History  Problem Relation Age of Onset  .  Nephrolithiasis Father   . Graves' disease Father   . Hypertension Other   . Cancer Other     colon   Social History  Substance Use Topics  . Smoking status: Current Every Day Smoker -- 0.10 packs/day for 5 years    Types: Cigarettes  . Smokeless tobacco: Never Used  . Alcohol Use: No   OB History    Gravida Para Term Preterm AB TAB SAB Ectopic Multiple Living   3 2 2  0 1 0 1 0 0 2     Review of Systems  Constitutional: Negative for fever and chills.  HENT: Positive for congestion and sore throat. Negative for hearing loss, mouth sores and voice change.        Hoarseness  Gastrointestinal: Negative for nausea and vomiting.  Musculoskeletal: Negative for myalgias.  Skin: Negative for rash.  All other systems reviewed and are negative.     Allergies  Ceftriaxone and Latex  Home Medications   Prior to Admission medications   Medication Sig Start Date End Date Taking? Authorizing Provider  acyclovir (ZOVIRAX) 200 MG capsule Take 200 mg by mouth 2 (two) times daily.    Historical Provider, MD  ALPRAZolam Prudy Feeler) 0.5 MG tablet Take 1 tablet (0.5 mg total) by mouth at bedtime as needed for anxiety. Patient not taking: Reported on 06/28/2014 07/19/13   Adline Potter, NP  azithromycin (ZITHROMAX) 500 MG tablet TAKE ALL 4 TABLETS AT ONCE Patient not taking:  Reported on 06/28/2014 06/27/14   Lazaro Arms, MD  levonorgestrel (MIRENA) 20 MCG/24HR IUD 1 each by Intrauterine route once. Placed 12/25/12    Historical Provider, MD  loratadine-pseudoephedrine (CLARITIN-D 12 HOUR) 5-120 MG per tablet Take 1 tablet by mouth 2 (two) times daily. Patient not taking: Reported on 06/28/2014 07/30/13   Ivery Quale, PA-C  megestrol (MEGACE) 40 MG tablet Take 3/day (at the same time) for 5 days; 2/day for 5 days, then 1/day PO prn bleeding Patient not taking: Reported on 06/28/2014 12/22/13   Jacklyn Shell, CNM  meloxicam (MOBIC) 7.5 MG tablet 1 po bid with food Patient not taking:  Reported on 06/28/2014 07/30/13   Ivery Quale, PA-C  metroNIDAZOLE (FLAGYL) 500 MG tablet Take 1 tablet (500 mg total) by mouth 2 (two) times daily. 06/28/14   Samuel Jester, DO  promethazine-codeine (PHENERGAN WITH CODEINE) 6.25-10 MG/5ML syrup Take 5 mLs by mouth every 6 (six) hours as needed for cough. Patient not taking: Reported on 06/28/2014 07/30/13   Ivery Quale, PA-C  sertraline (ZOLOFT) 50 MG tablet Take 1 tablet (50 mg total) by mouth daily. 07/19/13   Adline Potter, NP  valACYclovir (VALTREX) 500 MG tablet Take 1 tablet (500 mg total) by mouth daily. 02/01/15   Scarlette Calico Cresenzo-Dishmon, CNM   BP 133/65 mmHg  Pulse 72  Temp(Src) 98.3 F (36.8 C) (Oral)  Resp 18  Ht 5\' 10"  (1.778 m)  Wt 126.554 kg  BMI 40.03 kg/m2  SpO2 100% Physical Exam  Constitutional: She is oriented to person, place, and time. She appears well-developed and well-nourished.  Non-toxic appearance.  HENT:  Head: Normocephalic.  Right Ear: Tympanic membrane and external ear normal.  Left Ear: Tympanic membrane and external ear normal.  Mild increase redness of the posterior pharynx. Airway patent. Nasal congestion present.  Eyes: EOM and lids are normal. Pupils are equal, round, and reactive to light.  Neck: Normal range of motion. Neck supple. Carotid bruit is not present.  Cardiovascular: Normal rate, regular rhythm, normal heart sounds, intact distal pulses and normal pulses.   Pulmonary/Chest: Breath sounds normal. No respiratory distress.  Abdominal: Soft. Bowel sounds are normal. There is no tenderness. There is no guarding.  Musculoskeletal: Normal range of motion.  Lymphadenopathy:       Head (right side): No submandibular adenopathy present.       Head (left side): No submandibular adenopathy present.    She has no cervical adenopathy.  Neurological: She is alert and oriented to person, place, and time. She has normal strength. No cranial nerve deficit or sensory deficit.  Skin: Skin is  warm and dry.  Psychiatric: She has a normal mood and affect. Her speech is normal.  Nursing note and vitals reviewed.   ED Course  Procedures (including critical care time) Labs Review Labs Reviewed - No data to display  Imaging Review No results found. I have personally reviewed and evaluated these images and lab results as part of my medical decision-making.   EKG Interpretation None      MDM  The examination favors upper respiratory infection. No evidence for abscess, or other acute throat. Issues. The patient will be treated for the nasal congestion with Claritin-D. I've asked the patient use salt water gargles concerning the throat. We discussed hydration with increasing water, juices, Gatorade. Patient will use ibuprofen for fever, soreness, and aching. Patient is to return to the emergency department or see the primary physician if not improving.    Final diagnoses:  None    *I have reviewed nursing notes, vital signs, and all appropriate lab and imaging results for this patient.[address]    Jaleeah Slight, PA-C 06/09/15 2345  Doug SouSam Jacubowitz, MD 06/12/15 [phone]

## 2015-06-08 NOTE — Discharge Instructions (Signed)
Please increase fluids. Wash hands frequently. Use claritin D for congestion. Use ibuprofen for soreness and body aches. Wash hands frequently. Increase fluids. Upper Respiratory Infection, Adult Most upper respiratory infections (URIs) are a viral infection of the air passages leading to the lungs. A URI affects the nose, throat, and upper air passages. The most common type of URI is nasopharyngitis and is typically referred to as "the common cold." URIs run their course and usually go away on their own. Most of the time, a URI does not require medical attention, but sometimes a bacterial infection in the upper airways can follow a viral infection. This is called a secondary infection. Sinus and middle ear infections are common types of secondary upper respiratory infections. Bacterial pneumonia can also complicate a URI. A URI can worsen asthma and chronic obstructive pulmonary disease (COPD). Sometimes, these complications can require emergency medical care and may be life threatening.  CAUSES Almost all URIs are caused by viruses. A virus is a type of germ and can spread from one person to another.  RISKS FACTORS You may be at risk for a URI if:   You smoke.   You have chronic heart or lung disease.  You have a weakened defense (immune) system.   You are very young or very old.   You have nasal allergies or asthma.  You work in crowded or poorly ventilated areas.  You work in health care facilities or schools. SIGNS AND SYMPTOMS  Symptoms typically develop 2-3 days after you come in contact with a cold virus. Most viral URIs last 7-10 days. However, viral URIs from the influenza virus (flu virus) can last 14-18 days and are typically more severe. Symptoms may include:   Runny or stuffy (congested) nose.   Sneezing.   Cough.   Sore throat.   Headache.   Fatigue.   Fever.   Loss of appetite.   Pain in your forehead, behind your eyes, and over your cheekbones  (sinus pain).  Muscle aches.  DIAGNOSIS  Your health care provider may diagnose a URI by:  Physical exam.  Tests to check that your symptoms are not due to another condition such as:  Strep throat.  Sinusitis.  Pneumonia.  Asthma. TREATMENT  A URI goes away on its own with time. It cannot be cured with medicines, but medicines may be prescribed or recommended to relieve symptoms. Medicines may help:  Reduce your fever.  Reduce your cough.  Relieve nasal congestion. HOME CARE INSTRUCTIONS   Take medicines only as directed by your health care provider.   Gargle warm saltwater or take cough drops to comfort your throat as directed by your health care provider.  Use a warm mist humidifier or inhale steam from a shower to increase air moisture. This may make it easier to breathe.  Drink enough fluid to keep your urine clear or pale yellow.   Eat soups and other clear broths and maintain good nutrition.   Rest as needed.   Return to work when your temperature has returned to normal or as your health care provider advises. You may need to stay home longer to avoid infecting others. You can also use a face mask and careful hand washing to prevent spread of the virus.  Increase the usage of your inhaler if you have asthma.   Do not use any tobacco products, including cigarettes, chewing tobacco, or electronic cigarettes. If you need help quitting, ask your health care provider. PREVENTION  The best way  to protect yourself from getting a cold is to practice good hygiene.   Avoid oral or hand contact with people with cold symptoms.   Wash your hands often if contact occurs.  There is no clear evidence that vitamin C, vitamin E, echinacea, or exercise reduces the chance of developing a cold. However, it is always recommended to get plenty of rest, exercise, and practice good nutrition.  SEEK MEDICAL CARE IF:   You are getting worse rather than better.   Your  symptoms are not controlled by medicine.   You have chills.  You have worsening shortness of breath.  You have brown or red mucus.  You have yellow or brown nasal discharge.  You have pain in your face, especially when you bend forward.  You have a fever.  You have swollen neck glands.  You have pain while swallowing.  You have white areas in the back of your throat. SEEK IMMEDIATE MEDICAL CARE IF:   You have severe or persistent:  Headache.  Ear pain.  Sinus pain.  Chest pain.  You have chronic lung disease and any of the following:  Wheezing.  Prolonged cough.  Coughing up blood.  A change in your usual mucus.  You have a stiff neck.  You have changes in your:  Vision.  Hearing.  Thinking.  Mood. MAKE SURE YOU:   Understand these instructions.  Will watch your condition.  Will get help right away if you are not doing well or get worse.   This information is not intended to replace advice given to you by your health care provider. Make sure you discuss any questions you have with your health care provider.   Document Released: 11/13/2000 Document Revised: 10/04/2014 Document Reviewed: 08/25/2013 Elsevier Interactive Patient Education [employer]

## 2015-06-23 ENCOUNTER — Encounter (HOSPITAL_COMMUNITY): Payer: Self-pay

## 2015-06-23 ENCOUNTER — Emergency Department (HOSPITAL_COMMUNITY)
Admission: EM | Admit: 2015-06-23 | Discharge: 2015-06-23 | Disposition: A | Discharge status: Home / Self Care | Payer: Medicaid Other | Attending: Emergency Medicine | Admitting: Emergency Medicine

## 2015-06-23 DIAGNOSIS — L02415 Cutaneous abscess of right lower limb: Secondary | ICD-10-CM | POA: Diagnosis present

## 2015-06-23 DIAGNOSIS — L03115 Cellulitis of right lower limb: Secondary | ICD-10-CM | POA: Insufficient documentation

## 2015-06-23 DIAGNOSIS — Z792 Long term (current) use of antibiotics: Secondary | ICD-10-CM | POA: Diagnosis not present

## 2015-06-23 DIAGNOSIS — Z79899 Other long term (current) drug therapy: Secondary | ICD-10-CM | POA: Diagnosis not present

## 2015-06-23 DIAGNOSIS — Z91040 Latex allergy status: Secondary | ICD-10-CM | POA: Diagnosis not present

## 2015-06-23 DIAGNOSIS — Z8619 Personal history of other infectious and parasitic diseases: Secondary | ICD-10-CM | POA: Diagnosis not present

## 2015-06-23 DIAGNOSIS — Z8742 Personal history of other diseases of the female genital tract: Secondary | ICD-10-CM | POA: Insufficient documentation

## 2015-06-23 DIAGNOSIS — F17210 Nicotine dependence, cigarettes, uncomplicated: Secondary | ICD-10-CM | POA: Diagnosis not present

## 2015-06-23 DIAGNOSIS — F419 Anxiety disorder, unspecified: Secondary | ICD-10-CM | POA: Diagnosis not present

## 2015-06-23 DIAGNOSIS — Z791 Long term (current) use of non-steroidal anti-inflammatories (NSAID): Secondary | ICD-10-CM | POA: Insufficient documentation

## 2015-06-23 MED ORDER — DOXYCYCLINE HYCLATE 100 MG PO TABS
100.0000 mg | ORAL_TABLET | Freq: Once | ORAL | Status: AC
  Administered 2015-06-23: 100 mg via ORAL
  Filled 2015-06-23: qty 1

## 2015-06-23 MED ORDER — HYDROCODONE-ACETAMINOPHEN 5-325 MG PO TABS
1.0000 | ORAL_TABLET | Freq: Once | ORAL | Status: AC
  Administered 2015-06-23: 1 via ORAL
  Filled 2015-06-23: qty 1

## 2015-06-23 MED ORDER — DOXYCYCLINE HYCLATE 100 MG PO CAPS: 100.0000 mg | ORAL_CAPSULE | Freq: Two times a day (BID) | ORAL | Status: DC

## 2015-06-23 MED ORDER — HYDROCODONE-ACETAMINOPHEN 5-325 MG PO TABS: 1.0000 | ORAL_TABLET | Freq: Four times a day (QID) | ORAL | Status: DC | PRN

## 2015-06-23 NOTE — ED Notes (Signed)
Pt reports abscess to r lower leg x 2 days.  R lower leg swollen and red.  Pt c/o painful lymph nodes behind r knee and in r groin area.  Pt tearful in triage.  Unable to tolerate removing bandaid.  Pt also has low grade fever.

## 2015-06-23 NOTE — ED Provider Notes (Signed)
CSN: [id number]     Arrival date & time 06/23/15  1741 History   First MD Initiated Contact with Patient 06/23/15 2046     Chief Complaint  Patient presents with  . Abscess     (Consider location/radiation/quality/duration/timing/severity/associated sxs/prior Treatment) Patient is a 30 y.o. female presenting with abscess. The history is provided by the patient.  Abscess Associated symptoms: fever   Associated symptoms: no nausea and no vomiting    patient with a 2 day history of area of redness to right anterior shin. No history of injury or known bug bite. Patient states the pains been increasing. The redness has been spreading. Patient with some discomfort in the groin it behind her of right knee.  Past Medical History  Diagnosis Date  . Genital herpes   . Anxiety   . Tobacco abuse 01/10/2012  . Pregnant   . HSV-2 infection complicating pregnancy     Will suppress at 34 weeks. Don't discuss in front of FOB.  . History of abnormal Pap smear   . History of postpartum depression, currently pregnant   . Abnormal antenatal AFP screen     [place] and [place] was low risk.  . Mental disorder     hx pp depression now anxiety  . Gonorrhea    Past Surgical History  Procedure Laterality Date  . Tonsillectomy    . Cholecystectomy  01/10/2012    Procedure: LAPAROSCOPIC CHOLECYSTECTOMY;  Surgeon: Dalia Heading, MD;  Location: [facility];  Service: General;  Laterality: N/A;  attempted Intraopertive cholangiogram  . Cesarean section  03/04/2011    Procedure: CESAREAN SECTION;  Surgeon: Lesly Dukes, MD;  Location: [facility];  Service: Gynecology;  Laterality: N/A;  primary of baby  boy at 31  . Cesarean section N/A 10/27/2012    Procedure: CESAREAN SECTION;  Surgeon: Allie Bossier, MD;  Location: [facility];  Service: Obstetrics;  Laterality: N/A;   Family History  Problem Relation Age of Onset  . Nephrolithiasis Father   . Graves' disease Father   . Hypertension Other   . Cancer Other     colon    Social History  Substance Use Topics  . Smoking status: Current Every Day Smoker -- 0.10 packs/day for 5 years    Types: Cigarettes  . Smokeless tobacco: Never Used  . Alcohol Use: No   OB History    Gravida Para Term Preterm AB TAB SAB Ectopic Multiple Living   0 1 0 1 0 0 2     Review of Systems  Constitutional: Positive for fever.  HENT: Negative for congestion.   Eyes: Negative for redness.  Respiratory: Negative for shortness of breath.   Cardiovascular: Negative for chest pain.  Gastrointestinal: Negative for nausea, vomiting and abdominal pain.  Musculoskeletal: Negative for back pain and joint swelling.  Skin: Negative for wound.  Allergic/Immunologic: Negative for immunocompromised state.  Hematological: Does not bruise/bleed easily.  Psychiatric/Behavioral: Negative for confusion.      Allergies  Ceftriaxone and Latex  Home Medications   Prior to Admission medications   Medication Sig Start Date End Date Taking? Authorizing Provider  acyclovir (ZOVIRAX) 200 MG capsule Take 200 mg by mouth 2 (two) times daily.    Historical Provider, MD  ALPRAZolam Prudy Feeler) 0.5 MG tablet Take 1 tablet (0.5 mg total) by mouth at bedtime as needed for anxiety. Patient not taking: Reported on 06/28/2014 07/19/13   Adline Potter, NP  azithromycin (ZITHROMAX) 500 MG tablet TAKE  ALL 4 TABLETS AT ONCE Patient not taking: Reported on 06/28/2014 06/27/14   Lazaro Arms, MD  doxycycline (VIBRAMYCIN) 100 MG capsule Take 1 capsule (100 mg total) by mouth 2 (two) times daily. 06/23/15   Vanetta Mulders, MD  HYDROcodone-acetaminophen (NORCO/VICODIN) 5-325 MG tablet Take 1-2 tablets by mouth every 6 (six) hours as needed. 06/23/15   Vanetta Mulders, MD  ibuprofen (ADVIL,MOTRIN) 800 MG tablet Take 1 tablet (800 mg total) by mouth 3 (three) times daily. 06/08/15   Ivery Quale, PA-C  levonorgestrel (MIRENA) 20 MCG/24HR IUD 1 each by Intrauterine route once. Placed 12/25/12    Historical  Provider, MD  loratadine-pseudoephedrine (CLARITIN-D 12 HOUR) 5-120 MG tablet Take 1 tablet by mouth 2 (two) times daily. 06/08/15   Ivery Quale, PA-C  megestrol (MEGACE) 40 MG tablet Take 3/day (at the same time) for 5 days; 2/day for 5 days, then 1/day PO prn bleeding Patient not taking: Reported on 06/28/2014 12/22/13   Jacklyn Shell, CNM  meloxicam (MOBIC) 7.5 MG tablet 1 po bid with food Patient not taking: Reported on 06/28/2014 07/30/13   Ivery Quale, PA-C  metroNIDAZOLE (FLAGYL) 500 MG tablet Take 1 tablet (500 mg total) by mouth 2 (two) times daily. 06/28/14   Samuel Jester, DO  promethazine-codeine (PHENERGAN WITH CODEINE) 6.25-10 MG/5ML syrup Take 5 mLs by mouth every 6 (six) hours as needed for cough. Patient not taking: Reported on 06/28/2014 07/30/13   Ivery Quale, PA-C  sertraline (ZOLOFT) 50 MG tablet Take 1 tablet (50 mg total) by mouth daily. 07/19/13   Adline Potter, NP  valACYclovir (VALTREX) 500 MG tablet Take 1 tablet (500 mg total) by mouth daily. 02/01/15   Scarlette Calico Cresenzo-Dishmon, CNM   BP 130/66 mmHg  Pulse 90  Temp(Src) 99.2 F (37.3 C) (Oral)  Resp 18  Ht  (1.778 m)  Wt 123.832 kg  BMI 39.17 kg/m2  SpO2 99% Physical Exam  Constitutional: She is oriented to person, place, and time. She appears well-developed and well-nourished. No distress.  HENT:  Head: Normocephalic and atraumatic.  Mouth/Throat: Oropharynx is clear and moist.  Eyes: Conjunctivae and EOM are normal. Pupils are equal, round, and reactive to light.  Neck: Normal range of motion. Neck supple.  Cardiovascular: Normal rate and normal heart sounds.   Pulmonary/Chest: Effort normal and breath sounds normal. No respiratory distress.  Abdominal: Soft. Bowel sounds are normal. There is tenderness.  Musculoskeletal: She exhibits edema and tenderness.  Left anterior shin with area of erythema measuring about 7 cm. Area of induration measuring about 4 cm. No fluctuance no pointing  of abscess, no purulent discharge. No red streaking.  Not able to palpate any groin adenopathy or popliteal adenopathy.  Neurological: She is alert and oriented to person, place, and time. No cranial nerve deficit. She exhibits normal muscle tone. Coordination normal.  Skin: Skin is warm. There is erythema.  Nursing note and vitals reviewed.   ED Course  Procedures (including critical care time) Labs Review Labs Reviewed - No data to display  Imaging Review No results found. I have personally reviewed and evaluated these images and lab results as part of my medical decision-making.   EKG Interpretation None      MDM   Final diagnoses:  Abscess of leg, right    Patient with developing abscess cellulitis to the right anterior shin. Symptoms started about 2 days ago. No evidence or known history of injury or bug bite.  Leg with an area of erythema measuring approximately  7 cm. Central area of induration measuring about 4 cm no fluctuance. No purulent drainage. No no pointing of abscess.   Marked with skin marker. We'll give a dose of doxycycline here and discharged home on oral doxycycline. Patient may very well end up needing to have the abscess drained as it develops over the next couple days. Or perhaps it'll settle down with the antibiotics. In control with hydrocodone and Motrin. Patient nontoxic no acute distress.    Vanetta Mulders, MD 06/23/15 [phone]

## 2015-06-23 NOTE — ED Notes (Signed)
Pt reports red area to the right calf 2 days ago. Area getting larger & warm to the touch. Small spot in the center noted w/ clear drainage noted.

## 2015-06-23 NOTE — Discharge Instructions (Signed)
Abscess An abscess (boil or furuncle) is an infected area on or under the skin. This area is filled with yellowish-white fluid (pus) and other material (debris). HOME CARE   Only take medicines as told by your doctor.  If you were given antibiotic medicine, take it as directed. Finish the medicine even if you start to feel better.  If gauze is used, follow your doctor's directions for changing the gauze.  To avoid spreading the infection:  Keep your abscess covered with a bandage.  Wash your hands well.  Do not share personal care items, towels, or whirlpools with others.  Avoid skin contact with others.  Keep your skin and clothes clean around the abscess.  Keep all doctor visits as told. GET HELP RIGHT AWAY IF:   You have more pain, puffiness (swelling), or redness in the wound site.  You have more fluid or blood coming from the wound site.  You have muscle aches, chills, or you feel sick.  You have a fever. MAKE SURE YOU:   Understand these instructions.  Will watch your condition.  Will get help right away if you are not doing well or get worse.   This information is not intended to replace advice given to you by your health care provider. Make sure you discuss any questions you have with your health care provider.  As we discussed you have a developing abscess on your right shin. Will attempt to calm this down with antibiotics but it may turn into a abscess cavity that will need to be drained. If it worsens over the next couple days return. Take the antibiotics as directed for the next 7 days. Take pain medicine as needed.   Also recommend taking Motrin 800 mg every 8 hours on a regular basis to help with pain control.    Document Released: 11/06/2007 Document Revised: 11/19/2011 Document Reviewed: 08/03/2011 Elsevier Interactive Patient Education [employer]

## 2015-06-23 NOTE — ED Notes (Signed)
Pt alert & oriented x4, stable gait. Patient given discharge instructions, paperwork & prescription(s). Patient informed not to drive, operate any equipment & handel any important documents 4 hours after taking pain medication. Patient  instructed to stop at the registration desk to finish any additional paperwork. Patient  verbalized understanding. Pt left department w/ no further questions. 

## 2015-08-15 ENCOUNTER — Encounter (HOSPITAL_COMMUNITY): Payer: Self-pay

## 2015-08-15 ENCOUNTER — Emergency Department (HOSPITAL_COMMUNITY): Payer: Medicaid Other

## 2015-08-15 ENCOUNTER — Emergency Department (HOSPITAL_COMMUNITY)
Admission: EM | Admit: 2015-08-15 | Discharge: 2015-08-15 | Disposition: A | Discharge status: Home / Self Care | Payer: Medicaid Other | Attending: Emergency Medicine | Admitting: Emergency Medicine

## 2015-08-15 DIAGNOSIS — Y929 Unspecified place or not applicable: Secondary | ICD-10-CM | POA: Insufficient documentation

## 2015-08-15 DIAGNOSIS — Y999 Unspecified external cause status: Secondary | ICD-10-CM | POA: Insufficient documentation

## 2015-08-15 DIAGNOSIS — S99912A Unspecified injury of left ankle, initial encounter: Secondary | ICD-10-CM | POA: Diagnosis present

## 2015-08-15 DIAGNOSIS — F17210 Nicotine dependence, cigarettes, uncomplicated: Secondary | ICD-10-CM | POA: Insufficient documentation

## 2015-08-15 DIAGNOSIS — W1849XA Other slipping, tripping and stumbling without falling, initial encounter: Secondary | ICD-10-CM | POA: Diagnosis not present

## 2015-08-15 DIAGNOSIS — S93402A Sprain of unspecified ligament of left ankle, initial encounter: Secondary | ICD-10-CM | POA: Diagnosis not present

## 2015-08-15 DIAGNOSIS — Z79899 Other long term (current) drug therapy: Secondary | ICD-10-CM | POA: Diagnosis not present

## 2015-08-15 DIAGNOSIS — Y939 Activity, unspecified: Secondary | ICD-10-CM | POA: Diagnosis not present

## 2015-08-15 MED ORDER — IBUPROFEN 800 MG PO TABS
800.0000 mg | ORAL_TABLET | Freq: Once | ORAL | Status: AC
  Administered 2015-08-15: 800 mg via ORAL
  Filled 2015-08-15: qty 1

## 2015-08-15 MED ORDER — HYDROCODONE-ACETAMINOPHEN 5-325 MG PO TABS
1.0000 | ORAL_TABLET | Freq: Four times a day (QID) | ORAL | Status: DC | PRN
Start: 2015-08-15 — End: 2018-01-14

## 2015-08-15 MED ORDER — OXYCODONE-ACETAMINOPHEN 5-325 MG PO TABS
2.0000 | ORAL_TABLET | Freq: Once | ORAL | Status: AC
  Administered 2015-08-15: 2 via ORAL
  Filled 2015-08-15: qty 2

## 2015-08-15 MED ORDER — IBUPROFEN 800 MG PO TABS: 800.0000 mg | ORAL_TABLET | Freq: Three times a day (TID) | ORAL | Status: DC | PRN

## 2015-08-15 NOTE — ED Provider Notes (Signed)
TIME SEEN: 4:05 AM  CHIEF COMPLAINT: Left ankle pain  HPI: Pt is a 30 y.o. obese female who presents emergency with left ankle pain. Reports yesterday afternoon she tripped and twisted her ankle. Did not follow the ground or hit her head. Has been able to ambulate but is doing so with difficulty. States she took hydrocodone that her father gave her last eye which helped with pain but then she woke up in the middle the night with uncontrolled pain. No other injury. No numbness, tingling or focal weakness.  ROS: See HPI Constitutional: no fever  Eyes: no drainage  ENT: no runny nose   Cardiovascular:  no chest pain  Resp: no SOB  GI: no vomiting GU: no dysuria Integumentary: no rash  Allergy: no hives  Musculoskeletal: no leg swelling  Neurological: no slurred speech ROS otherwise negative  PAST MEDICAL HISTORY/PAST SURGICAL HISTORY:  Past Medical History  Diagnosis Date  . Genital herpes   . Anxiety   . Tobacco abuse 01/10/2012  . Pregnant   . HSV-2 infection complicating pregnancy     Will suppress at 34 weeks. Don't discuss in front of FOB.  . History of abnormal Pap smear   . History of postpartum depression, currently pregnant   . Abnormal antenatal AFP screen     [place] and [place] was low risk.  . Mental disorder     hx pp depression now anxiety  . Gonorrhea     MEDICATIONS:  Prior to Admission medications   Medication Sig Start Date End Date Taking? Authorizing Provider  levonorgestrel (MIRENA) 20 MCG/24HR IUD 1 each by Intrauterine route once. Placed 12/25/12   Yes Historical Provider, MD  doxycycline (VIBRAMYCIN) 100 MG capsule Take 1 capsule (100 mg total) by mouth 2 (two) times daily. 06/23/15   Vanetta Mulders, MD  HYDROcodone-acetaminophen (NORCO/VICODIN) 5-325 MG tablet Take 1-2 tablets by mouth every 6 (six) hours as needed. 06/23/15   Vanetta Mulders, MD  ibuprofen (ADVIL,MOTRIN) 800 MG tablet Take 1 tablet (800 mg total) by mouth 3 (three) times daily. Patient not  taking: Reported on 06/23/2015 06/08/15   Ivery Quale, PA-C  loratadine-pseudoephedrine (CLARITIN-D 12 HOUR) 5-120 MG tablet Take 1 tablet by mouth 2 (two) times daily. Patient not taking: Reported on 06/23/2015 06/08/15   Ivery Quale, PA-C    ALLERGIES:  Allergies  Allergen Reactions  . Ceftriaxone Hives, Shortness Of Breath and Itching    Penicillins OK  . Latex Itching and Other (See Comments)    REACTION: discoloring of skin    SOCIAL HISTORY:  Social History  Substance Use Topics  . Smoking status: Current Every Day Smoker -- 0.10 packs/day for 5 years    Types: Cigarettes  . Smokeless tobacco: Never Used  . Alcohol Use: No    FAMILY HISTORY: Family History  Problem Relation Age of Onset  . Nephrolithiasis Father   . Graves' disease Father   . Hypertension Other   . Cancer Other     colon    EXAM: BP 125/63 mmHg  Pulse 91  Temp(Src) 98.1 F (36.7 C) (Oral)  Resp 20  Ht  (1.778 m)  Wt 293 lb (132.904 kg)  BMI 42.04 kg/m2  SpO2 99% CONSTITUTIONAL: Alert and oriented and responds appropriately to questions. Well-appearing; well-nourished HEAD: Normocephalic EYES: Conjunctivae clear, PERRL ENT: normal nose; no rhinorrhea; moist mucous membranes NECK: Supple, no meningismus, no LAD  CARD: RRR; S1 and S2 appreciated; no murmurs, no clicks, no rubs, no gallops RESP: Normal  chest excursion without splinting or tachypnea; breath sounds clear and equal bilaterally; no wheezes, no rhonchi, no rales, no hypoxia or respiratory distress, speaking full sentences ABD/GI: Normal bowel sounds; non-distended; soft, non-tender, no rebound, no guarding, no peritoneal signs BACK:  The back appears normal and is non-tender to palpation, there is no CVA tenderness EXT: Tender to palpation over the left ankle over the medial and lateral malleoli with swelling but no ecchymosis or deformity. 2+ DP pulses bilaterally. No tenderness over the left foot or more proximal tibia or  fibula. No tenderness over the knee or hip on the left side. Decreased range of motion of left ankle secondary to pain but no ligamentous laxity but exam is limited secondary to pain and swelling. Otherwise Normal ROM in all joints; otherwise extremities are non-tender to palpation; no edema; normal capillary refill; no cyanosis, no calf tenderness or swelling    SKIN: Normal color for age and race; warm; no rash NEURO: Moves all extremities equally, sensation to light touch intact diffusely, cranial nerves II through XII intact PSYCH: The patient's mood and manner are appropriate. Grooming and personal hygiene are appropriate.  MEDICAL DECISION MAKING: Patient here with left ankle pain. Neurovascularlly intact distally. No other sign of injury on exam. X-ray shows no fracture or dislocation. She has her own ankle brace that she came in with. Will put her back in this ankle brace. We'll give her crutches for comfort. Have advised her to keep her leg elevated and apply ice. We'll discharge with prescriptions for ibuprofen and Vicodin. Will get outpatient orthopedic follow-up information as needed. Discussed return precautions. She verbalized understanding and is comfortable with this plan.       Layla MawKristen N Salik Grewell, DO 08/15/15 [phone]

## 2015-08-15 NOTE — ED Notes (Signed)
Patient verbalizes understanding of discharge instructions, prescription medications, home care and follow up care if needed. Patient out of department at this time. 

## 2015-08-15 NOTE — ED Notes (Signed)
Patient states she tripped and turned her ankle. C/o pain to left ankle.

## 2015-08-15 NOTE — Discharge Instructions (Signed)
Ankle Sprain °An ankle sprain is an injury to the strong, fibrous tissues (ligaments) that hold the bones of your ankle joint together.  °CAUSES °An ankle sprain is usually caused by a fall or by twisting your ankle. Ankle sprains most commonly occur when you step on the outer edge of your foot, and your ankle turns inward. People who participate in sports are more prone to these types of injuries.  °SYMPTOMS  °· Pain in your ankle. The pain may be present at rest or only when you are trying to stand or walk. °· Swelling. °· Bruising. Bruising may develop immediately or within 1 to 2 days after your injury. °· Difficulty standing or walking, particularly when turning corners or changing directions. °DIAGNOSIS  °Your caregiver will ask you details about your injury and perform a physical exam of your ankle to determine if you have an ankle sprain. During the physical exam, your caregiver will press on and apply pressure to specific areas of your foot and ankle. Your caregiver will try to move your ankle in certain ways. An X-ray exam may be done to be sure a bone was not broken or a ligament did not separate from one of the bones in your ankle (avulsion fracture).  °TREATMENT  °Certain types of braces can help stabilize your ankle. Your caregiver can make a recommendation for this. Your caregiver may recommend the use of medicine for pain. If your sprain is severe, your caregiver may refer you to a surgeon who helps to restore function to parts of your skeletal system (orthopedist) or a physical therapist. °HOME CARE INSTRUCTIONS  °· Apply ice to your injury for 1-2 days or as directed by your caregiver. Applying ice helps to reduce inflammation and pain. °· Put ice in a plastic bag. °· Place a towel between your skin and the bag. °· Leave the ice on for 15-20 minutes at a time, every 2 hours while you are awake. °· Only take over-the-counter or prescription medicines for pain, discomfort, or fever as directed by  your caregiver. °· Elevate your injured ankle above the level of your heart as much as possible for 2-3 days. °· If your caregiver recommends crutches, use them as instructed. Gradually put weight on the affected ankle. Continue to use crutches or a cane until you can walk without feeling pain in your ankle. °· If you have a plaster splint, wear the splint as directed by your caregiver. Do not rest it on anything harder than a pillow for the first 24 hours. Do not put weight on it. Do not get it wet. You may take it off to take a shower or bath. °· You may have been given an elastic bandage to wear around your ankle to provide support. If the elastic bandage is too tight (you have numbness or tingling in your foot or your foot becomes cold and blue), adjust the bandage to make it comfortable. °· If you have an air splint, you may blow more air into it or let air out to make it more comfortable. You may take your splint off at night and before taking a shower or bath. Wiggle your toes in the splint several times per day to decrease swelling. °SEEK MEDICAL CARE IF:  °· You have rapidly increasing bruising or swelling. °· Your toes feel extremely cold or you lose feeling in your foot. °· Your pain is not relieved with medicine. °SEEK IMMEDIATE MEDICAL CARE IF: °· Your toes are numb or blue. °·   You have severe pain that is increasing. °MAKE SURE YOU:  °· Understand these instructions. °· Will watch your condition. °· Will get help right away if you are not doing well or get worse. °  °This information is not intended to replace advice given to you by your health care provider. Make sure you discuss any questions you have with your health care provider. °  °Document Released: 05/20/2005 Document Revised: 06/10/2014 Document Reviewed: 06/01/2011 °Elsevier Interactive Patient Education ©2016 [employer] °RICE for Routine Care of Injuries °The routine care of many injuries includes rest, ice, compression, and elevation  (RICE therapy). RICE therapy is often recommended for injuries to soft tissues, such as a muscle strain, ligament injuries, bruises, and overuse injuries. It can also be used for some bony injuries. Using RICE therapy can help to relieve pain, lessen swelling, and enable your body to heal. °Rest °Rest is required to allow your body to heal. This usually involves reducing your normal activities and avoiding use of the injured part of your body. Generally, you can return to your normal activities when you are comfortable and have been given permission by your health care provider. °Ice °Icing your injury helps to keep the swelling down, and it lessens pain. Do not apply ice directly to your skin. °· Put ice in a plastic bag. °· Place a towel between your skin and the bag. °· Leave the ice on for 20 minutes, 2-3 times a day. °Do this for as long as you are directed by your health care provider. °Compression °Compression means putting pressure on the injured area. Compression helps to keep swelling down, gives support, and helps with discomfort. Compression may be done with an elastic bandage. If an elastic bandage has been applied, follow these general tips: °· Remove and reapply the bandage every 3-4 hours or as directed by your health care provider. °· Make sure the bandage is not wrapped too tightly, because this can cut off circulation. If part of your body beyond the bandage becomes blue, numb, cold, swollen, or more painful, your bandage is most likely too tight. If this occurs, remove your bandage and reapply it more loosely. °· See your health care provider if the bandage seems to be making your problems worse rather than better. °Elevation °Elevation means keeping the injured area raised. This helps to lessen swelling and decrease pain. If possible, your injured area should be elevated at or above the level of your heart or the center of your chest. °WHEN SHOULD I SEEK MEDICAL CARE? °You should seek medical  care if: °· Your pain and swelling continue. °· Your symptoms are getting worse rather than improving. °These symptoms may indicate that further evaluation or further X-rays are needed. Sometimes, X-rays may not show a small broken bone (fracture) until a number of days later. Make a follow-up appointment with your health care provider. °WHEN SHOULD I SEEK IMMEDIATE MEDICAL CARE? °You should seek immediate medical care if: °· You have sudden severe pain at or below the area of your injury. °· You have redness or increased swelling around your injury. °· You have tingling or numbness at or below the area of your injury that does not improve after you remove the elastic bandage. °  °This information is not intended to replace advice given to you by your health care provider. Make sure you discuss any questions you have with your health care provider. °  °Document Released: 09/01/2000 Document Revised: 02/08/2015 Document Reviewed: 04/27/2014 °Elsevier Interactive Patient Education ©2016 [employer] ° °

## 2016-04-23 ENCOUNTER — Other Ambulatory Visit (HOSPITAL_COMMUNITY): Payer: Self-pay | Admitting: Nurse Practitioner

## 2016-04-23 DIAGNOSIS — Z30431 Encounter for routine checking of intrauterine contraceptive device: Secondary | ICD-10-CM

## 2016-05-01 ENCOUNTER — Ambulatory Visit (HOSPITAL_COMMUNITY): Admission: RE | Admit: 2016-05-01 | Payer: Self-pay | Source: Ambulatory Visit

## 2017-10-01 ENCOUNTER — Emergency Department (HOSPITAL_COMMUNITY)
Admission: EM | Admit: 2017-10-01 | Discharge: 2017-10-01 | Disposition: A | Discharge status: Home / Self Care | Payer: Self-pay

## 2017-10-01 NOTE — ED Notes (Signed)
Pt called for triage x1! No answer °

## 2017-10-01 NOTE — ED Notes (Signed)
Pt called for the second time. No answer 

## 2018-01-14 ENCOUNTER — Emergency Department (HOSPITAL_COMMUNITY): Payer: No Typology Code available for payment source

## 2018-01-14 ENCOUNTER — Emergency Department (HOSPITAL_COMMUNITY)
Admission: EM | Admit: 2018-01-14 | Discharge: 2018-01-14 | Disposition: A | Discharge status: Home / Self Care | Payer: No Typology Code available for payment source | Attending: Emergency Medicine | Admitting: Emergency Medicine

## 2018-01-14 ENCOUNTER — Other Ambulatory Visit: Payer: Self-pay

## 2018-01-14 ENCOUNTER — Encounter (HOSPITAL_COMMUNITY): Payer: Self-pay | Admitting: Emergency Medicine

## 2018-01-14 DIAGNOSIS — Y999 Unspecified external cause status: Secondary | ICD-10-CM | POA: Diagnosis not present

## 2018-01-14 DIAGNOSIS — Y929 Unspecified place or not applicable: Secondary | ICD-10-CM | POA: Diagnosis not present

## 2018-01-14 DIAGNOSIS — S39012A Strain of muscle, fascia and tendon of lower back, initial encounter: Secondary | ICD-10-CM | POA: Insufficient documentation

## 2018-01-14 DIAGNOSIS — F17210 Nicotine dependence, cigarettes, uncomplicated: Secondary | ICD-10-CM | POA: Insufficient documentation

## 2018-01-14 DIAGNOSIS — Y92410 Unspecified street and highway as the place of occurrence of the external cause: Secondary | ICD-10-CM | POA: Insufficient documentation

## 2018-01-14 DIAGNOSIS — S3992XA Unspecified injury of lower back, initial encounter: Secondary | ICD-10-CM | POA: Diagnosis present

## 2018-01-14 DIAGNOSIS — Y939 Activity, unspecified: Secondary | ICD-10-CM | POA: Insufficient documentation

## 2018-01-14 MED ORDER — IBUPROFEN 800 MG PO TABS: 800.0000 mg | ORAL_TABLET | Freq: Three times a day (TID) | ORAL | 0 refills | Status: DC

## 2018-01-14 MED ORDER — DIAZEPAM 5 MG PO TABS: 2.5000 mg | ORAL_TABLET | Freq: Three times a day (TID) | ORAL | 0 refills | Status: DC | PRN

## 2018-01-14 MED ORDER — KETOROLAC TROMETHAMINE 60 MG/2ML IM SOLN
60.0000 mg | Freq: Once | INTRAMUSCULAR | Status: AC
  Administered 2018-01-14: 60 mg via INTRAMUSCULAR
  Filled 2018-01-14: qty 2

## 2018-01-14 MED ORDER — HYDROCODONE-ACETAMINOPHEN 5-325 MG PO TABS
1.0000 | ORAL_TABLET | Freq: Once | ORAL | Status: AC
  Administered 2018-01-14: 1 via ORAL
  Filled 2018-01-14: qty 1

## 2018-01-14 NOTE — ED Notes (Signed)
ED Provider at bedside. 
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
## 2018-08-12 ENCOUNTER — Other Ambulatory Visit: Payer: Self-pay

## 2018-08-12 ENCOUNTER — Encounter (HOSPITAL_COMMUNITY): Payer: Self-pay

## 2018-08-12 ENCOUNTER — Emergency Department (HOSPITAL_COMMUNITY)
Admission: EM | Admit: 2018-08-12 | Discharge: 2018-08-12 | Disposition: A | Discharge status: Home / Self Care | Payer: Medicaid Other | Attending: Emergency Medicine | Admitting: Emergency Medicine

## 2018-08-12 DIAGNOSIS — Z79899 Other long term (current) drug therapy: Secondary | ICD-10-CM | POA: Insufficient documentation

## 2018-08-12 DIAGNOSIS — F17210 Nicotine dependence, cigarettes, uncomplicated: Secondary | ICD-10-CM | POA: Insufficient documentation

## 2018-08-12 DIAGNOSIS — N760 Acute vaginitis: Secondary | ICD-10-CM

## 2018-08-12 DIAGNOSIS — B9789 Other viral agents as the cause of diseases classified elsewhere: Secondary | ICD-10-CM | POA: Insufficient documentation

## 2018-08-12 DIAGNOSIS — B9689 Other specified bacterial agents as the cause of diseases classified elsewhere: Secondary | ICD-10-CM

## 2018-08-12 DIAGNOSIS — Z91040 Latex allergy status: Secondary | ICD-10-CM | POA: Insufficient documentation

## 2018-08-12 LAB — URINALYSIS, ROUTINE W REFLEX MICROSCOPIC
Bilirubin Urine: NEGATIVE
Glucose, UA: NEGATIVE mg/dL
Hgb urine dipstick: NEGATIVE
Ketones, ur: NEGATIVE mg/dL
Leukocytes,Ua: NEGATIVE
Nitrite: NEGATIVE
Protein, ur: NEGATIVE mg/dL
Specific Gravity, Urine: 1.015 (ref 1.005–1.030)
pH: 8 (ref 5.0–8.0)

## 2018-08-12 LAB — WET PREP, GENITAL
Sperm: NONE SEEN
Trich, Wet Prep: NONE SEEN
Yeast Wet Prep HPF POC: NONE SEEN

## 2018-08-12 LAB — PREGNANCY, URINE: Preg Test, Ur: NEGATIVE

## 2018-08-12 MED ORDER — AZITHROMYCIN 250 MG PO TABS
2000.0000 mg | ORAL_TABLET | Freq: Once | ORAL | Status: AC
  Administered 2018-08-12: 2000 mg via ORAL
  Filled 2018-08-12: qty 8

## 2018-08-12 MED ORDER — DOXYCYCLINE HYCLATE 100 MG PO CAPS: 100.0000 mg | ORAL_CAPSULE | Freq: Two times a day (BID) | ORAL | 0 refills | Status: DC

## 2018-08-12 MED ORDER — METRONIDAZOLE 500 MG PO TABS: 500.0000 mg | ORAL_TABLET | Freq: Two times a day (BID) | ORAL | 0 refills | Status: DC

## 2018-08-12 NOTE — ED Notes (Addendum)
Pt states she has had an "uncomfortable" feeling in her vaginal area this morning with a "odd odor", states she only has one sexual partner and last intercourse was 2 days ago, pt denies urinary symptoms, normal bowel movements, and states no lower abdominal pain or pelvic pain.

## 2018-08-12 NOTE — ED Notes (Signed)
Pelvic kit at bedside

## 2018-08-12 NOTE — ED Triage Notes (Signed)
Pt reports vaginal odor and white discharge x 2 days. Denies any itching or abnormal bleeding.  Says feels "uncomfortable."

## 2018-08-13 LAB — GC/CHLAMYDIA PROBE AMP (~~LOC~~) NOT AT ARMC
Chlamydia: NEGATIVE
Neisseria Gonorrhea: NEGATIVE

## 2018-08-22 NOTE — ED Provider Notes (Signed)
[facility] EMERGENCY DEPARTMENT Provider Note   CSN: [id number] Arrival date & time: 08/12/18  4287    History   Chief Complaint Chief Complaint  Patient presents with  . S74.5    HPI Pamela [name] is a 33 y.o. female.     HPI   33 year old female with vaginal discomfort.  Onset yesterday.  Also noticed a strong odor.  Ports that she is monogamous with 1 sexual partner.  Does have unprotected intercourse.  No urinary complaints.  No abdominal pain or back pain.  No fevers or chills.  Past Medical History:  Diagnosis Date  . Abnormal antenatal AFP screen    [place] and [place] was low risk.  Marland Kitchen Anxiety   . Genital herpes   . Gonorrhea   . History of abnormal Pap smear   . History of postpartum depression, currently pregnant   . HSV-2 infection complicating pregnancy    Will suppress at 34 weeks. Don't discuss in front of FOB.  . Mental disorder    hx pp depression now anxiety  . Pregnant   . Tobacco abuse 01/10/2012    Patient Active Problem List   Diagnosis Date Noted  . Gonorrhea 12/24/2013  . Genital herpes 12/22/2013  . Anxiety 07/19/2013  . Postpartum depression 11/11/2012  . History of chlamydia 08/11/2012  . History of gonorrhea 08/11/2012  . History of abnormal Pap smear 08/11/2012  . Morbid obesity (HCC) 01/10/2012  . Bradycardia 01/10/2012  . Cholelithiasis 01/09/2012  . Pancreatitis 01/09/2012    Past Surgical History:  Procedure Laterality Date  . CESAREAN SECTION  03/04/2011   Procedure: CESAREAN SECTION;  Surgeon: Lesly Dukes, MD;  Location: [facility];  Service: Gynecology;  Laterality: N/A;  primary of baby  boy at 36  . CESAREAN SECTION N/A 10/27/2012   Procedure: CESAREAN SECTION;  Surgeon: Allie Bossier, MD;  Location: [facility];  Service: Obstetrics;  Laterality: N/A;  . CHOLECYSTECTOMY  01/10/2012   Procedure: LAPAROSCOPIC CHOLECYSTECTOMY;  Surgeon: Dalia Heading, MD;  Location: [facility];  Service: General;  Laterality: N/A;  attempted Intraopertive  cholangiogram  . TONSILLECTOMY       OB History    Gravida  3   Para  2   Term  2   Preterm  0   AB  1   Living  2     SAB  1   TAB  0   Ectopic  0   Multiple  0   Live Births  2            Home Medications    Prior to Admission medications   Medication Sig Start Date End Date Taking? Authorizing Provider  levonorgestrel (MIRENA) 20 MCG/24HR IUD 1 each by Intrauterine route once. Placed 12/25/12   Yes [provider]  Multiple Vitamins-Minerals (HAIR SKIN AND NAILS FORMULA) TABS Take 1 tablet by mouth daily.   Yes [provider]  doxycycline (VIBRAMYCIN) 100 MG capsule Take 1 capsule (100 mg total) by mouth 2 (two) times daily. 08/12/18   Raeford Razor, MD  metroNIDAZOLE (FLAGYL) 500 MG tablet Take 1 tablet (500 mg total) by mouth 2 (two) times daily. 08/12/18   Raeford Razor, MD    Family History Family History  Problem Relation Age of Onset  . Nephrolithiasis Father   . Graves' disease Father   . Hypertension Other   . Cancer Other        colon    Social History Social History  Tobacco Use  . Smoking status: Current Every Day Smoker    Packs/day: 0.10    Years: 5.00    Pack years: 0.50    Types: Cigarettes  . Smokeless tobacco: Never Used  Substance Use Topics  . Alcohol use: No  . Drug use: No     Allergies   Ceftriaxone and Latex   Review of Systems Review of Systems  All systems reviewed and negative, other than as noted in HPI.  Physical Exam Updated Vital Signs BP 113/67 (BP Location: Left Arm)   Pulse (!) 59   Temp 98.1 F (36.7 C) (Oral)   Resp 16   Ht  (1.778 m)   Wt 124.7 kg   SpO2 100%   BMI 39.46 kg/m   Physical Exam Vitals signs and nursing note reviewed.  Constitutional:      General: She is not in acute distress.    Appearance: She is well-developed.  HENT:     Head: Normocephalic and atraumatic.  Eyes:     General:        Right eye: No discharge.        Left eye: No  discharge.     Conjunctiva/sclera: Conjunctivae normal.  Neck:     Musculoskeletal: Neck supple.  Cardiovascular:     Rate and Rhythm: Normal rate and regular rhythm.     Heart sounds: Normal heart sounds. No murmur. No friction rub. No gallop.   Pulmonary:     Effort: Pulmonary effort is normal. No respiratory distress.     Breath sounds: Normal breath sounds.  Abdominal:     General: There is no distension.     Palpations: Abdomen is soft.     Tenderness: There is no abdominal tenderness.  Genitourinary:    Comments: Chaperone present.  Normal external female genitalia.  Normal-appearing cervix without lesions.  No CMT or adnexal tenderness.  Mild to moderate grayish discharge. Musculoskeletal:        General: No tenderness.  Skin:    General: Skin is warm and dry.  Neurological:     Mental Status: She is alert.  Psychiatric:        Behavior: Behavior normal.        Thought Content: Thought content normal.      ED Treatments / Results  Labs (all labs ordered are listed, but only abnormal results are displayed) Labs Reviewed  WET PREP, GENITAL - Abnormal; Notable for the following components:      Result Value   Clue Cells Wet Prep HPF POC PRESENT (*)    WBC, Wet Prep HPF POC FEW (*)    All other components within normal limits  URINALYSIS, ROUTINE W REFLEX MICROSCOPIC  PREGNANCY, URINE  GC/CHLAMYDIA PROBE AMP ([place]) NOT AT [facility]    EKG None  Radiology No results found.  Procedures Procedures (including critical care time)  Medications Ordered in ED Medications  azithromycin (ZITHROMAX) tablet 2,000 mg (2,000 mg Oral Given 08/12/18 1028)     Initial Impression / Assessment and Plan / ED Course  I have reviewed the triage vital signs and the nursing notes.  Pertinent labs & imaging results that were available during my care of the patient were reviewed by me and considered in my medical decision making (see chart for details).        33 year old  female vaginal discomfort and odor.  Likely BV.  Will treat as such.  Will cover for GC.  Rocephin allergy.  She was  given 2 g of azithromycin.   Final Clinical Impressions(s) / ED Diagnoses   Final diagnoses:  Bacterial vaginosis    ED Discharge Orders         Ordered    doxycycline (VIBRAMYCIN) 100 MG capsule  2 times daily     08/12/18 1036    metroNIDAZOLE (FLAGYL) 500 MG tablet  2 times daily     08/12/18 1036           Raeford Razor, MD 08/22/18 1812

## 2018-11-17 ENCOUNTER — Other Ambulatory Visit: Payer: Self-pay

## 2018-11-17 ENCOUNTER — Encounter (HOSPITAL_COMMUNITY): Payer: Self-pay | Admitting: *Deleted

## 2018-11-17 ENCOUNTER — Emergency Department (HOSPITAL_COMMUNITY): Payer: BLUE CROSS/BLUE SHIELD

## 2018-11-17 ENCOUNTER — Emergency Department (HOSPITAL_COMMUNITY)
Admission: EM | Admit: 2018-11-17 | Discharge: 2018-11-17 | Disposition: A | Discharge status: Home / Self Care | Payer: BLUE CROSS/BLUE SHIELD | Attending: Emergency Medicine | Admitting: Emergency Medicine

## 2018-11-17 DIAGNOSIS — Z20828 Contact with and (suspected) exposure to other viral communicable diseases: Secondary | ICD-10-CM | POA: Insufficient documentation

## 2018-11-17 DIAGNOSIS — F17210 Nicotine dependence, cigarettes, uncomplicated: Secondary | ICD-10-CM | POA: Diagnosis not present

## 2018-11-17 DIAGNOSIS — Z79899 Other long term (current) drug therapy: Secondary | ICD-10-CM | POA: Diagnosis not present

## 2018-11-17 DIAGNOSIS — J020 Streptococcal pharyngitis: Secondary | ICD-10-CM | POA: Diagnosis not present

## 2018-11-17 DIAGNOSIS — J029 Acute pharyngitis, unspecified: Secondary | ICD-10-CM | POA: Diagnosis present

## 2018-11-17 LAB — GROUP A STREP BY PCR: Group A Strep by PCR: DETECTED — AB

## 2018-11-17 LAB — MONONUCLEOSIS SCREEN: Mono Screen: NEGATIVE

## 2018-11-17 MED ORDER — FLUCONAZOLE 100 MG PO TABS
200.0000 mg | ORAL_TABLET | Freq: Once | ORAL | Status: AC
  Administered 2018-11-17: 200 mg via ORAL
  Filled 2018-11-17: qty 2

## 2018-11-17 MED ORDER — DIPHENHYDRAMINE HCL 12.5 MG/5ML PO ELIX
12.5000 mg | ORAL_SOLUTION | Freq: Once | ORAL | Status: AC
  Administered 2018-11-17: 12.5 mg via ORAL
  Filled 2018-11-17: qty 5

## 2018-11-17 MED ORDER — PENICILLIN G BENZATHINE 1200000 UNIT/2ML IM SUSP
1.2000 10*6.[IU] | Freq: Once | INTRAMUSCULAR | Status: AC
  Administered 2018-11-17: 1.2 10*6.[IU] via INTRAMUSCULAR
  Filled 2018-11-17: qty 2

## 2018-11-17 MED ORDER — IBUPROFEN 100 MG/5ML PO SUSP
400.0000 mg | Freq: Once | ORAL | Status: AC
  Administered 2018-11-17: 400 mg via ORAL
  Filled 2018-11-17: qty 20

## 2018-11-17 NOTE — ED Triage Notes (Addendum)
Pt c/o left ear pain and swelling to lymph nodes on left side of her throat, and difficulty swallowing that started a few days ago. Denies fever, difficulty hearing. Pt does report brown ear drainage.

## 2018-11-17 NOTE — ED Provider Notes (Signed)
[facility] EMERGENCY DEPARTMENT Provider Note   CSN: [id number] Arrival date & time: 11/17/18  1228     History   Chief Complaint Chief Complaint  Patient presents with  . Otalgia  . Sore Throat    HPI Pamela [name] is a 33 y.o. female.     Patient is a 33 year old female who presents to the emergency department with a complaint of ear pain, sore throat, neck pain, and fatigue.  The patient states that this problem started 2 days ago.  The patient states that she has been having problems with sore throat.  She can swallow liquids, but liquids and solids cause pain.  She is been noticing some swollen lymph nodes.  She has some pain with movement of her neck, but no stiffness or rigidity.  Left ear pain.  She has a sensation of fatigue.  She has a sensation of feeling short of breath.  There is been no cough, no unusual rash, no fever reported.  The patient denies any recent changes in her taste or smell.  There is been no recent travel.  No exposure to any known diagnosis of COVID-19.  Patient presents to the emergency department for additional evaluation and management of these issues.  The history is provided by the patient.  Otalgia Associated symptoms: sore throat   Associated symptoms: no abdominal pain, no cough, no diarrhea, no fever, no neck pain and no vomiting   Sore Throat Associated symptoms include shortness of breath. Pertinent negatives include no chest pain and no abdominal pain.    Past Medical History:  Diagnosis Date  . Abnormal antenatal AFP screen    [place] and [place] was low risk.  Marland Kitchen. Anxiety   . Genital herpes   . Gonorrhea   . History of abnormal Pap smear   . History of postpartum depression, currently pregnant   . HSV-2 infection complicating pregnancy    Will suppress at 34 weeks. Don't discuss in front of FOB.  . Mental disorder    hx pp depression now anxiety  . Pregnant   . Tobacco abuse 01/10/2012    Patient Active Problem List   Diagnosis  Date Noted  . Gonorrhea 12/24/2013  . Genital herpes 12/22/2013  . Anxiety 07/19/2013  . Postpartum depression 11/11/2012  . History of chlamydia 08/11/2012  . History of gonorrhea 08/11/2012  . History of abnormal Pap smear 08/11/2012  . Morbid obesity (HCC) 01/10/2012  . Bradycardia 01/10/2012  . Cholelithiasis 01/09/2012  . Pancreatitis 01/09/2012    Past Surgical History:  Procedure Laterality Date  . CESAREAN SECTION  03/04/2011   Procedure: CESAREAN SECTION;  Surgeon: Lesly DukesKelly H. Leggett, MD;  Location: [facility];  Service: Gynecology;  Laterality: N/A;  primary of baby  boy at [id number]  . CESAREAN SECTION N/A 10/27/2012   Procedure: CESAREAN SECTION;  Surgeon: Allie BossierMyra C Dove, MD;  Location: [facility];  Service: Obstetrics;  Laterality: N/A;  . CHOLECYSTECTOMY  01/10/2012   Procedure: LAPAROSCOPIC CHOLECYSTECTOMY;  Surgeon: Dalia HeadingMark A Jenkins, MD;  Location: [facility];  Service: General;  Laterality: N/A;  attempted Intraopertive cholangiogram  . TONSILLECTOMY       OB History    Gravida  3   Para  2   Term  2   Preterm  0   AB  1   Living  2     SAB  1   TAB  0   Ectopic  0   Multiple  0   Live Births  2            Home Medications    Prior to Admission medications   Medication Sig Start Date End Date Taking? Authorizing Provider  doxycycline (VIBRAMYCIN) 100 MG capsule Take 1 capsule (100 mg total) by mouth 2 (two) times daily. 08/12/18   Raeford RazorKohut, Stephen, MD  levonorgestrel (MIRENA) 20 MCG/24HR IUD 1 each by Intrauterine route once. Placed 12/25/12    [provider]  metroNIDAZOLE (FLAGYL) 500 MG tablet Take 1 tablet (500 mg total) by mouth 2 (two) times daily. 08/12/18   Raeford RazorKohut, Stephen, MD  Multiple Vitamins-Minerals (HAIR SKIN AND NAILS FORMULA) TABS Take 1 tablet by mouth daily.    [provider]    Family History Family History  Problem Relation Age of Onset  . Nephrolithiasis Father   . Graves' disease Father   . Hypertension Other   . Cancer  Other        colon    Social History Social History   Tobacco Use  . Smoking status: Current Every Day Smoker    Packs/day: 0.25    Years: 5.00    Pack years: 1.25    Types: Cigarettes  . Smokeless tobacco: Never Used  Substance Use Topics  . Alcohol use: No  . Drug use: No     Allergies   Ceftriaxone and Latex   Review of Systems Review of Systems  Constitutional: Positive for appetite change and fatigue. Negative for activity change, diaphoresis and fever.       All ROS Neg except as noted in HPI  HENT: Positive for ear pain and sore throat. Negative for nosebleeds.   Eyes: Negative for photophobia and discharge.  Respiratory: Positive for shortness of breath. Negative for cough, choking, wheezing and stridor.   Cardiovascular: Negative for chest pain, palpitations and leg swelling.  Gastrointestinal: Negative for abdominal pain, blood in stool, constipation, diarrhea, nausea and vomiting.  Genitourinary: Negative for dysuria, frequency and hematuria.  Musculoskeletal: Negative for arthralgias, back pain and neck pain.  Skin: Negative.   Neurological: Negative for dizziness, seizures and speech difficulty.  Psychiatric/Behavioral: Negative for confusion and hallucinations.     Physical Exam Updated Vital Signs BP (!) 103/51   Pulse 69   Temp 97.9 F (36.6 C) (Oral)   Resp 16   Ht 5\' 10"  (1.778 m)   Wt 127 kg   SpO2 100%   BMI 40.18 kg/m   Physical Exam Vitals signs and nursing note reviewed.  Constitutional:      Appearance: She is well-developed. She is not toxic-appearing.  HENT:     Head: Normocephalic.     Right Ear: Tympanic membrane and external ear normal.     Left Ear: Tympanic membrane and external ear normal.     Mouth/Throat:     Mouth: Mucous membranes are moist.     Pharynx: Posterior oropharyngeal erythema and uvula swelling present.  Eyes:     General: Lids are normal.     Pupils: Pupils are equal, round, and reactive to light.   Neck:     Musculoskeletal: Normal range of motion and neck supple.     Vascular: No carotid bruit.  Cardiovascular:     Rate and Rhythm: Normal rate and regular rhythm.     Pulses: Normal pulses.     Heart sounds: Normal heart sounds.  Pulmonary:     Effort: No respiratory distress.     Breath sounds: Normal breath sounds.  Abdominal:  General: Bowel sounds are normal.     Palpations: Abdomen is soft.     Tenderness: There is no abdominal tenderness. There is no guarding.  Musculoskeletal: Normal range of motion.  Lymphadenopathy:     Head:     Right side of head: No submandibular adenopathy.     Left side of head: No submandibular adenopathy.     Cervical: No cervical adenopathy.  Skin:    General: Skin is warm and dry.  Neurological:     Mental Status: She is alert and oriented to person, place, and time.     Cranial Nerves: No cranial nerve deficit.     Sensory: No sensory deficit.  Psychiatric:        Speech: Speech normal.      ED Treatments / Results  Labs (all labs ordered are listed, but only abnormal results are displayed) Labs Reviewed  GROUP A STREP BY PCR  NOVEL CORONAVIRUS, NAA (HOSPITAL ORDER, SEND-OUT TO REF LAB)  MONONUCLEOSIS SCREEN    EKG    Radiology No results found.  Procedures Procedures (including critical care time)  Medications Ordered in ED Medications  ibuprofen (ADVIL) 100 MG/5ML suspension 400 mg (has no administration in time range)     Initial Impression / Assessment and Plan / ED Course  I have reviewed the triage vital signs and the nursing notes.  Pertinent labs & imaging results that were available during my care of the patient were reviewed by me and considered in my medical decision making (see chart for details).          Final Clinical Impressions(s) / ED Diagnoses MDM  Vital signs within normal limits.  Pulse oximetry is 100% on room air.  Within normal limits by my interpretation.  Patient in no  distress at this time.  Patient has increased redness of the posterior pharynx with swelling of the uvula.  Patient complains of sore throat.  Lab testing has been obtained.  Patient given ibuprofen to assist with the soreness and discomfort.  The Monospot test is negative.  Group A strep is positive.  Chest x-ray is negative for acute changes.  I have informed patient of the positive strep screen.  The patient has a coronavirus test pending.  The patient has elected to have the Bicillin injection as treatment for her condition.  I have given the patient instructions to use her mask and to wash hands frequently.  Patient has been given a work note.  Patient is asked to increase fluids.  I have asked her to use salt water gargles and Chloraseptic spray to assist with pain.  She will use ibuprofen every 6 hours for soreness and discomfort.  The patient acknowledges understanding of these instructions.   Final diagnoses:  Strep pharyngitis    ED Discharge Orders    None       Lily Kocher, Hershal Coria 11/17/18 2128    Fredia Sorrow, MD 11/18/18 [phone]

## 2018-11-17 NOTE — Discharge Instructions (Signed)
Your chest x-ray is negative.  Your mononucleosis/kissing disease test is negative.  Your strep test is positive.  This is highly contagious.  Please use your mask, and keep your distance from others over the next 48 hours.  You received an injection of Bicillin in the emergency department, and you will be less contagious after the 48-hour period.  Salt water gargles and Chloraseptic spray will be helpful.  Please use ibuprofen every 6 hours for soreness, and or temperature elevations.  A COVID-19 test was obtained, and someone from the [employer] Managers Office will call you if there are any abnormalities concerning this test.

## 2018-11-18 LAB — NOVEL CORONAVIRUS, NAA (HOSP ORDER, SEND-OUT TO REF LAB; TAT 18-24 HRS): SARS-CoV-2, NAA: NOT DETECTED

## 2019-03-03 ENCOUNTER — Other Ambulatory Visit: Payer: Self-pay

## 2019-03-03 ENCOUNTER — Ambulatory Visit (INDEPENDENT_AMBULATORY_CARE_PROVIDER_SITE_OTHER): Payer: BLUE CROSS/BLUE SHIELD | Admitting: Women's Health

## 2019-03-03 ENCOUNTER — Encounter: Payer: Self-pay | Admitting: Women's Health

## 2019-03-03 VITALS — BP 124/77 | HR 83 | Ht 70.0 in | Wt 301.0 lb

## 2019-03-03 DIAGNOSIS — F418 Other specified anxiety disorders: Secondary | ICD-10-CM

## 2019-03-03 DIAGNOSIS — Z30432 Encounter for removal of intrauterine contraceptive device: Secondary | ICD-10-CM

## 2019-03-03 NOTE — Progress Notes (Signed)
   IUD REMOVAL  Patient name: Pamela [name] MRN [id number]  Date of birth: August 12, 1985 Subjective Findings:   SHELITA [name] is a 33 y.o. G30P2012 African American female being seen today for removal of a Mirena  IUD. Her IUD was placed 12/25/12.  She desires removal because it is past time. Signed copy of informed consent in chart.  Started crying while talking to me. H/O dep/anx, not currently on meds. Has a lot of built of frustration/anger about HSV dx during 2012 pregnancy. Also feels she can't discuss this or stress of having a child w/ special needs w/ anyone. Is interested in therapy. Not interested in meds right now. Denies SI/HI. Also thinks she has 2 warts that keep going away and returning in vulvar area, wants them gone.   No LMP recorded. (Menstrual status: IUD). Last pap thinks it was within last couple of years at [facility]. Results were:  normal The planned method of family planning is abstinence for right now Pertinent History Reviewed:   Reviewed past medical,surgical, social, obstetrical and family history.  Reviewed problem list, medications and allergies. Objective Findings & Procedure:    Vitals:   03/03/19 1515  BP: 124/77  Pulse: 83  Weight: (!) 301 lb (136.5 kg)  Height: 5\' 10"  (1.778 m)  Body mass index is 43.19 kg/m.  No results found for this or any previous visit (from the past 24 hour(s)).   Time out was performed.  Vulva: bilateral labial inclusion cysts (not warts as she thought) A graves speculum was placed in the vagina.  The cervix was visualized, and the strings were not visible. They were teased out of cervical canal w/ forceps and grasped and the Mirena  IUD was easily removed intact without complications. The patient tolerated the procedure well.  Assessment & Plan:   1) Mirena  IUD removal Follow-up prn problems. Condoms if doesn't want pregnancy right now. If does desire pregnancy, start pnv asap  2) Bilateral labial inclusion cysts> wants removed,  will schedule  3) Dep/anx> wants therapy, declines meds at this time. Referral to [facility] counseling   No orders of the defined types were placed in this encounter.   Follow-up: Return for 1st available for vulvar cyst removal; please get pap records from [facility].  [place], [facility] 03/03/2019 4:42 PM

## 2019-03-04 ENCOUNTER — Encounter: Payer: Self-pay | Admitting: Obstetrics & Gynecology

## 2019-03-04 ENCOUNTER — Other Ambulatory Visit: Payer: Self-pay

## 2019-03-04 ENCOUNTER — Ambulatory Visit (INDEPENDENT_AMBULATORY_CARE_PROVIDER_SITE_OTHER): Payer: BLUE CROSS/BLUE SHIELD | Admitting: Obstetrics & Gynecology

## 2019-03-04 ENCOUNTER — Other Ambulatory Visit: Payer: Self-pay | Admitting: Obstetrics & Gynecology

## 2019-03-04 VITALS — BP 123/77 | HR 78 | Ht 70.0 in | Wt 303.0 lb

## 2019-03-04 DIAGNOSIS — N907 Vulvar cyst: Secondary | ICD-10-CM | POA: Diagnosis not present

## 2019-03-04 NOTE — Progress Notes (Signed)
Patient ID: Pamela [name], female   DOB: 11-Oct-1985, 33 y.o.   MRN: [id number] PROCEDURE NOTE  PRE-OP DIAGNOSIS:  Bilateral vulvar epidermal inclusion cysts, 1 left, 1 right  PROCEDURE:  Skin Lesion Excision(s)  INDICATIONS:  BRAYLIE BADAMI is a 33 y.o. female who presents for minor skin surgery.  The patient understands all risks, benefits, indications, potential complications, and alternatives, and freely consents for the procedure.  The patient also understands the option of performing no surgery, the risk for scarring, and the technique of the procedure.  ANESTHESIA:  Local.  TECHNIQUE:  After informed consent was obtained, and after the skin was prepped and draped, 1% lidocaine without epinephrine for anesthetic was injected around and underneath the site.   elliptical excision in total was performed.  Sutures were placed on both sides.  A dressing was applied and wound care instructions were provided.  Dayle tolerated the procedure well and without complications.  The patient will be alert for any signs of cutaneous infection and will follow up as instructed.  Florian Buff, MD 03/04/2019 9:56 AM

## 2019-03-04 NOTE — Addendum Note (Signed)
Addended by: Linton Rump on: 03/04/2019 10:21 AM   Modules accepted: Orders

## 2019-03-12 ENCOUNTER — Encounter: Payer: Self-pay | Admitting: Obstetrics & Gynecology

## 2019-03-12 ENCOUNTER — Ambulatory Visit (INDEPENDENT_AMBULATORY_CARE_PROVIDER_SITE_OTHER): Payer: BLUE CROSS/BLUE SHIELD | Admitting: Obstetrics & Gynecology

## 2019-03-12 ENCOUNTER — Other Ambulatory Visit: Payer: Self-pay

## 2019-03-12 VITALS — BP 125/75 | HR 82 | Ht 70.0 in | Wt 296.4 lb

## 2019-03-12 DIAGNOSIS — N907 Vulvar cyst: Secondary | ICD-10-CM

## 2019-03-12 DIAGNOSIS — Z4802 Encounter for removal of sutures: Secondary | ICD-10-CM

## 2019-03-12 NOTE — Progress Notes (Signed)
Pt is s/p removal of bilateral vulvar epidermal inclusion cysts Pathology is benign No complaints  Sutures removed today without difficulty  No follow up needed

## 2019-04-01 ENCOUNTER — Other Ambulatory Visit: Payer: BLUE CROSS/BLUE SHIELD | Admitting: Women's Health

## 2019-04-05 ENCOUNTER — Ambulatory Visit (INDEPENDENT_AMBULATORY_CARE_PROVIDER_SITE_OTHER): Payer: BLUE CROSS/BLUE SHIELD | Admitting: Advanced Practice Midwife

## 2019-04-05 ENCOUNTER — Other Ambulatory Visit: Payer: Self-pay

## 2019-04-05 ENCOUNTER — Encounter: Payer: Self-pay | Admitting: Advanced Practice Midwife

## 2019-04-05 VITALS — BP 138/74 | HR 94 | Ht 70.0 in | Wt 308.0 lb

## 2019-04-05 DIAGNOSIS — Z01419 Encounter for gynecological examination (general) (routine) without abnormal findings: Secondary | ICD-10-CM

## 2019-04-05 DIAGNOSIS — Z131 Encounter for screening for diabetes mellitus: Secondary | ICD-10-CM

## 2019-04-05 DIAGNOSIS — Z13220 Encounter for screening for lipoid disorders: Secondary | ICD-10-CM

## 2019-04-05 MED ORDER — PNV PRENATAL PLUS MULTIVITAMIN 27-1 MG PO TABS: 1.0000 | ORAL_TABLET | Freq: Every day | ORAL | 11 refills | Status: DC

## 2019-04-05 NOTE — Progress Notes (Signed)
Pamela [name] 33 y.o.  Vitals:   04/05/19 1509  BP: 138/74  Pulse: 94     Filed Weights   04/05/19 1509  Weight: (!) 308 lb (139.7 kg)    Past Medical History: Past Medical History:  Diagnosis Date  . Abnormal antenatal AFP screen    [place] and [place] was low risk.  Marland Kitchen Anxiety   . Genital herpes   . Gonorrhea   . History of abnormal Pap smear   . History of postpartum depression, currently pregnant   . HSV-2 infection complicating pregnancy    Will suppress at 34 weeks. Don't discuss in front of FOB.  . Mental disorder    hx pp depression now anxiety  . Pregnant   . Tobacco abuse 01/10/2012    Past Surgical History: Past Surgical History:  Procedure Laterality Date  . CESAREAN SECTION  03/04/2011   Procedure: CESAREAN SECTION;  Surgeon: Guss Bunde, MD;  Location: [place] [facility];  Service: Gynecology;  Laterality: N/A;  primary of baby  boy at 72  . CESAREAN SECTION N/A 10/27/2012   Procedure: CESAREAN SECTION;  Surgeon: Emily Filbert, MD;  Location: [facility] [facility];  Service: Obstetrics;  Laterality: N/A;  . CHOLECYSTECTOMY  01/10/2012   Procedure: LAPAROSCOPIC CHOLECYSTECTOMY;  Surgeon: Jamesetta So, MD;  Location: [facility];  Service: General;  Laterality: N/A;  attempted Intraopertive cholangiogram  . TONSILLECTOMY      Family History: Family History  Problem Relation Age of Onset  . Nephrolithiasis Father   . Graves' disease Father   . Hypertension Other   . Cancer Other        colon    Social History: Social History   Tobacco Use  . Smoking status: Current Every Day Smoker    Packs/day: 0.25    Years: 5.00    Pack years: 1.25    Types: Cigarettes  . Smokeless tobacco: Never Used  Substance Use Topics  . Alcohol use: No  . Drug use: No    Allergies:  Allergies  Allergen Reactions  . Ceftriaxone Hives, Shortness Of Breath and Itching    Penicillins OK  . Lactase   . Latex Itching and Other (See Comments)    REACTION: discoloring of skin      Current  Outpatient Medications:  Marland Kitchen  Multiple Vitamins-Minerals (HAIR SKIN AND NAILS FORMULA) TABS, Take 1 tablet by mouth daily., Disp: , Rfl:  .  Prenatal Vit-Fe Fumarate-FA (PNV PRENATAL PLUS MULTIVITAMIN) 27-1 MG TABS, Take 1 tablet by mouth daily., Disp: 30 tablet, Rfl: 11  History of Present Illness: Here for pap and physical.  Last pap last year. Normal, Had cysts removed from labia a month ago. Had IUD removed a month ago also, getting a few boils. Uses teatree oil, helps. May want to get pregnant. Has gone to [facility] counseling since last visit, feels better. Wants STD screening and screening labs   Review of Systems   Patient denies any headaches, blurred vision, shortness of breath, chest pain, abdominal pain, problems with bowel movements, urination, or intercourse.   Physical Exam: General:  Well developed, well nourished, no acute distress Skin:  Warm and dry Neck:  Midline trachea, normal thyroid Lungs; Clear to auscultation bilaterally Breast:  No dominant palpable mass, retraction, or nipple discharge Cardiovascular: Regular rate and rhythm Abdomen:  Soft, non tender, no hepatosplenomegaly Pelvic:  External genitalia is normal in appearance.  The vagina is normal in appearance. Well healed from surgery.  Extremities:  No swelling  or varicosities noted Psych:  Feels better   Impression: normal GYN exam  Plan:  Orders Placed This Encounter  Procedures  . GC/Chlamydia Probe Amp  . HIV Antibody (routine testing w rflx)  . Hemoglobin A1c  . Lipid panel    Order Specific Question:   Has the patient fasted?    Answer:   Yes  . CBC   PNV rx'd. Start now

## 2019-04-06 LAB — GC/CHLAMYDIA PROBE AMP
Chlamydia trachomatis, NAA: NEGATIVE
Neisseria Gonorrhoeae by PCR: NEGATIVE

## 2019-04-07 DIAGNOSIS — Z131 Encounter for screening for diabetes mellitus: Secondary | ICD-10-CM | POA: Diagnosis not present

## 2019-04-07 DIAGNOSIS — Z01419 Encounter for gynecological examination (general) (routine) without abnormal findings: Secondary | ICD-10-CM | POA: Diagnosis not present

## 2019-04-07 DIAGNOSIS — Z13220 Encounter for screening for lipoid disorders: Secondary | ICD-10-CM | POA: Diagnosis not present

## 2019-04-08 LAB — CBC
Hematocrit: 39.3 % (ref 34.0–46.6)
Hemoglobin: 13.6 g/dL (ref 11.1–15.9)
MCH: 30.5 pg (ref 26.6–33.0)
MCHC: 34.6 g/dL (ref 31.5–35.7)
MCV: 88 fL (ref 79–97)
Platelets: 347 10*3/uL (ref 150–450)
RBC: 4.46 x10E6/uL (ref 3.77–5.28)
RDW: 12.5 % (ref 11.7–15.4)
WBC: 9.3 10*3/uL (ref 3.4–10.8)

## 2019-04-08 LAB — HIV ANTIBODY (ROUTINE TESTING W REFLEX): HIV Screen 4th Generation wRfx: NONREACTIVE

## 2019-04-08 LAB — LIPID PANEL
Chol/HDL Ratio: 3.8 ratio (ref 0.0–4.4)
Cholesterol, Total: 186 mg/dL (ref 100–199)
HDL: 49 mg/dL (ref >39)
LDL Chol Calc (NIH): 124 mg/dL — ABNORMAL HIGH (ref 0–99)
Triglycerides: 70 mg/dL (ref 0–149)
VLDL Cholesterol Cal: 13 mg/dL (ref 5–40)

## 2019-04-08 LAB — HEMOGLOBIN A1C
Est. average glucose Bld gHb Est-mCnc: 117 mg/dL
Hgb A1c MFr Bld: 5.7 % — ABNORMAL HIGH (ref 4.8–5.6)

## 2019-06-01 ENCOUNTER — Encounter (HOSPITAL_COMMUNITY): Payer: Self-pay | Admitting: Emergency Medicine

## 2019-06-01 ENCOUNTER — Other Ambulatory Visit: Payer: Self-pay

## 2019-06-01 ENCOUNTER — Emergency Department (HOSPITAL_COMMUNITY)
Admission: EM | Admit: 2019-06-01 | Discharge: 2019-06-01 | Disposition: A | Discharge status: Home / Self Care | Payer: BLUE CROSS/BLUE SHIELD | Attending: Emergency Medicine | Admitting: Emergency Medicine

## 2019-06-01 DIAGNOSIS — M545 Low back pain, unspecified: Secondary | ICD-10-CM

## 2019-06-01 DIAGNOSIS — R52 Pain, unspecified: Secondary | ICD-10-CM | POA: Diagnosis not present

## 2019-06-01 DIAGNOSIS — I959 Hypotension, unspecified: Secondary | ICD-10-CM | POA: Diagnosis not present

## 2019-06-01 DIAGNOSIS — F17210 Nicotine dependence, cigarettes, uncomplicated: Secondary | ICD-10-CM | POA: Insufficient documentation

## 2019-06-01 DIAGNOSIS — M5489 Other dorsalgia: Secondary | ICD-10-CM | POA: Diagnosis not present

## 2019-06-01 DIAGNOSIS — Z91040 Latex allergy status: Secondary | ICD-10-CM | POA: Insufficient documentation

## 2019-06-01 MED ORDER — METHOCARBAMOL 1000 MG/10ML IJ SOLN
INTRAMUSCULAR | Status: AC
  Filled 2019-06-01: qty 10

## 2019-06-01 MED ORDER — NAPROXEN 500 MG PO TABS: 500.0000 mg | ORAL_TABLET | Freq: Two times a day (BID) | ORAL | 0 refills | Status: DC

## 2019-06-01 MED ORDER — METHOCARBAMOL 1000 MG/10ML IJ SOLN
1000.0000 mg | Freq: Once | INTRAVENOUS | Status: AC
  Administered 2019-06-01: 1000 mg via INTRAVENOUS
  Filled 2019-06-01: qty 10

## 2019-06-01 MED ORDER — CYCLOBENZAPRINE HCL 10 MG PO TABS: 10.0000 mg | ORAL_TABLET | Freq: Three times a day (TID) | ORAL | 0 refills | Status: DC | PRN

## 2019-06-01 MED ORDER — KETOROLAC TROMETHAMINE 30 MG/ML IJ SOLN
30.0000 mg | Freq: Once | INTRAMUSCULAR | Status: AC
  Administered 2019-06-01: 30 mg via INTRAVENOUS
  Filled 2019-06-01: qty 1

## 2019-06-01 MED ORDER — ACETAMINOPHEN 325 MG PO TABS
650.0000 mg | ORAL_TABLET | Freq: Once | ORAL | Status: AC
  Administered 2019-06-01: 05:00:00 650 mg via ORAL
  Filled 2019-06-01: qty 2

## 2019-06-01 NOTE — ED Triage Notes (Signed)
Per ems, pt c/o back pain that started around 7pm.

## 2019-06-01 NOTE — Discharge Instructions (Signed)
Apply ice for thirty minutes at a time, four times a day.  Take acetaminophen as needed for additional pain relief. 

## 2019-06-01 NOTE — ED Provider Notes (Signed)
[facility] EMERGENCY DEPARTMENT Provider Note   CSN: [id number] Arrival date & time: 06/01/19  0344   History Chief Complaint  Patient presents with  . Back Pain    Pamela [name] is a 33 y.o. female.  The history is provided by the patient.  Back Pain She had sudden onset this afternoon of severe pain in her lower back.  She was leaning on a car when it started.  Pain does radiate to the right hip and thigh.  There is no numbness or tingling, but she states that her legs were weak to where she was unable to walk.  She denies bowel or bladder dysfunction.  She denies prior episodes of back pain.  She has not taken anything for pain.  She denies any recent trauma or unusual activity.  Past Medical History:  Diagnosis Date  . Abnormal antenatal AFP screen    [place] and [place] was low risk.  Marland Kitchen Anxiety   . Genital herpes   . Gonorrhea   . History of abnormal Pap smear   . History of postpartum depression, currently pregnant   . HSV-2 infection complicating pregnancy    Will suppress at 34 weeks. Don't discuss in front of FOB.  . Mental disorder    hx pp depression now anxiety  . Pregnant   . Tobacco abuse 01/10/2012    Patient Active Problem List   Diagnosis Date Noted  . Depression with anxiety 03/03/2019  . Gonorrhea 12/24/2013  . Genital herpes 12/22/2013  . Anxiety 07/19/2013  . Postpartum depression 11/11/2012  . History of chlamydia 08/11/2012  . History of gonorrhea 08/11/2012  . History of abnormal Pap smear 08/11/2012  . Morbid obesity (HCC) 01/10/2012  . Bradycardia 01/10/2012  . Cholelithiasis 01/09/2012  . Pancreatitis 01/09/2012    Past Surgical History:  Procedure Laterality Date  . CESAREAN SECTION  03/04/2011   Procedure: CESAREAN SECTION;  Surgeon: Lesly Dukes, MD;  Location: [facility];  Service: Gynecology;  Laterality: N/A;  primary of baby  boy at 56  . CESAREAN SECTION N/A 10/27/2012   Procedure: CESAREAN SECTION;  Surgeon: Allie Bossier, MD;   Location: [facility];  Service: Obstetrics;  Laterality: N/A;  . CHOLECYSTECTOMY  01/10/2012   Procedure: LAPAROSCOPIC CHOLECYSTECTOMY;  Surgeon: Dalia Heading, MD;  Location: [facility];  Service: General;  Laterality: N/A;  attempted Intraopertive cholangiogram  . TONSILLECTOMY       OB History    Gravida  3   Para  2   Term  2   Preterm  0   AB  1   Living  2     SAB  1   TAB  0   Ectopic  0   Multiple  0   Live Births  2           Family History  Problem Relation Age of Onset  . Nephrolithiasis Father   . Graves' disease Father   . Hypertension Other   . Cancer Other        colon    Social History   Tobacco Use  . Smoking status: Current Every Day Smoker    Packs/day: 0.25    Years: 5.00    Pack years: 1.25    Types: Cigarettes  . Smokeless tobacco: Never Used  Substance Use Topics  . Alcohol use: No  . Drug use: No    Home Medications Prior to Admission medications   Medication Sig Start Date End Date  Taking? Authorizing Provider  Multiple Vitamins-Minerals (HAIR SKIN AND NAILS FORMULA) TABS Take 1 tablet by mouth daily.    [provider]  Prenatal Vit-Fe Fumarate-FA (PNV PRENATAL PLUS MULTIVITAMIN) 27-1 MG TABS Take 1 tablet by mouth daily. 04/05/19   Cresenzo-Dishmon, Joaquim Lai, CNM    Allergies    Ceftriaxone, Lactase, and Latex  Review of Systems   Review of Systems  Musculoskeletal: Positive for back pain.  All other systems reviewed and are negative.   Physical Exam Updated Vital Signs BP (!) 110/42   Pulse 76   SpO2 99%   Physical Exam Vitals and nursing note reviewed.   33 year old female, resting comfortably and in no acute distress. Vital signs are normal. Oxygen saturation is 99%, which is normal. Head is normocephalic and atraumatic. PERRLA, EOMI. Oropharynx is clear. Neck is nontender and supple without adenopathy or JVD. Back is moderately tender across the lower lumbar area.  There is moderate bilateral paralumbar  spasm.  Straight leg raise is positive bilaterally at 15 degrees.  There is no CVA tenderness. Lungs are clear without rales, wheezes, or rhonchi. Chest is nontender. Heart has regular rate and rhythm without murmur. Abdomen is soft, flat, nontender without masses or hepatosplenomegaly and peristalsis is normoactive. Extremities have no cyanosis or edema, full range of motion is present. Skin is warm and dry without rash. Neurologic: Mental status is normal, cranial nerves are intact, there are no motor or sensory deficits.  ED Results / Procedures / Treatments   Labs (all labs ordered are listed, but only abnormal results are displayed) Labs Reviewed - No data to display  EKG None  Radiology No results found.  Procedures Procedures   Medications Ordered in ED Medications - No data to display  ED Course  I have reviewed the triage vital signs and the nursing notes.  Pertinent labs & imaging results that were available during my care of the patient were reviewed by me and considered in my medical decision making (see chart for details).  MDM Rules/Calculators/A&P Low back pain which appears musculoskeletal.  No evidence of acute neurologic injury.  No evidence of suspicion for severe process such as ruptured aneurysm.  Old records are reviewed, and she has no relevant past visits.  She will be given  ketorolac, methocarbamol as well as oral acetaminophen.  5:05 AM She has received ketorolac and acetaminophen and had some improvement.  She has been ambulatory within the room.  Still awaiting methocarbamol infusion.  7:00 AM She is feeling much better following methocarbamol.  She is discharged with prescriptions for naproxen and cyclobenzaprine.  Advised ice, over-the-counter acetaminophen as needed.  Return precautions discussed.  Final Clinical Impression(s) / ED Diagnoses Final diagnoses:  Acute bilateral low back pain without sciatica    Rx / DC Orders ED Discharge  Orders         Ordered    naproxen (NAPROSYN) 500 MG tablet  2 times daily     06/01/19 0659    cyclobenzaprine (FLEXERIL) 10 MG tablet  3 times daily PRN     06/01/19 6812           Delora Fuel, MD 75/17/00 [phone]

## 2019-07-02 ENCOUNTER — Telehealth: Payer: Self-pay | Admitting: *Deleted

## 2019-07-02 MED ORDER — FLUCONAZOLE 150 MG PO TABS: ORAL_TABLET | ORAL | 1 refills | Status: DC

## 2019-07-02 NOTE — Addendum Note (Signed)
Addended by: Cyril Mourning A on: 07/02/2019 01:37 PM   Modules accepted: Orders

## 2019-07-02 NOTE — Telephone Encounter (Signed)
Patient states she has a very bad yeast infection (itching and white, clumpy discharge)and is requesting Diflucan be sent to her pharmacy.  Advised to check with pharmacy later today if she does not hear from our office.

## 2019-07-02 NOTE — Telephone Encounter (Signed)
rx diflucan  

## 2019-08-05 DIAGNOSIS — L03031 Cellulitis of right toe: Secondary | ICD-10-CM | POA: Diagnosis not present

## 2019-08-05 DIAGNOSIS — L03032 Cellulitis of left toe: Secondary | ICD-10-CM | POA: Diagnosis not present

## 2019-08-05 DIAGNOSIS — D2372 Other benign neoplasm of skin of left lower limb, including hip: Secondary | ICD-10-CM | POA: Diagnosis not present

## 2019-08-05 DIAGNOSIS — S90852A Superficial foreign body, left foot, initial encounter: Secondary | ICD-10-CM | POA: Diagnosis not present

## 2019-08-19 DIAGNOSIS — D2372 Other benign neoplasm of skin of left lower limb, including hip: Secondary | ICD-10-CM | POA: Diagnosis not present

## 2019-08-19 DIAGNOSIS — L03032 Cellulitis of left toe: Secondary | ICD-10-CM | POA: Diagnosis not present

## 2019-09-06 ENCOUNTER — Other Ambulatory Visit: Payer: Self-pay | Admitting: Advanced Practice Midwife

## 2019-09-06 ENCOUNTER — Other Ambulatory Visit: Payer: Self-pay

## 2019-09-06 ENCOUNTER — Other Ambulatory Visit (INDEPENDENT_AMBULATORY_CARE_PROVIDER_SITE_OTHER): Payer: BLUE CROSS/BLUE SHIELD

## 2019-09-06 ENCOUNTER — Encounter: Payer: Self-pay | Admitting: Advanced Practice Midwife

## 2019-09-06 ENCOUNTER — Ambulatory Visit (INDEPENDENT_AMBULATORY_CARE_PROVIDER_SITE_OTHER): Payer: BLUE CROSS/BLUE SHIELD | Admitting: Advanced Practice Midwife

## 2019-09-06 VITALS — BP 119/75 | HR 92 | Wt 309.0 lb

## 2019-09-06 DIAGNOSIS — Z9889 Other specified postprocedural states: Secondary | ICD-10-CM | POA: Diagnosis not present

## 2019-09-06 DIAGNOSIS — O3680X0 Pregnancy with inconclusive fetal viability, not applicable or unspecified: Secondary | ICD-10-CM

## 2019-09-06 DIAGNOSIS — Z3201 Encounter for pregnancy test, result positive: Secondary | ICD-10-CM

## 2019-09-06 DIAGNOSIS — O074 Failed attempted termination of pregnancy without complication: Secondary | ICD-10-CM

## 2019-09-06 LAB — POCT URINE PREGNANCY: Preg Test, Ur: POSITIVE — AB

## 2019-09-06 NOTE — Progress Notes (Signed)
Pamela [name] is a 34 y.o. year old  female   who presents for placement of a Liletta IUD. She had a medical TAB on 3/30, started bleeding 6 hours later, saw POC 3/31 and hasn't had intercourse since;  her pregnancy test today is still positive   [place] [id number] anteverted uterus, no IUP visualized,complex thickened endometrium 20 mm mid/lus,color flow within the endometrium,normal ovaries   A:  Retained POC vs normal/expected blood, etc in uterus 5 days SP medical AB  P:  Delay IUD for 3 more weeks (everything should be out by then and we can be sure that she doesn't have retained POCs. Severe/abnormal Bleeding/warning signs should be f/u w/AB clinic   Jacklyn Shell 09/06/2019 3:55 PM

## 2019-09-06 NOTE — Progress Notes (Signed)
[place] [id number] anteverted uterus, no IUP visualized,complex thickened endometrium 20 mm mid/lus,color flow within the endometrium,normal ovaries
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
## 2019-09-27 ENCOUNTER — Encounter: Payer: BLUE CROSS/BLUE SHIELD | Admitting: Women's Health

## 2019-09-27 ENCOUNTER — Other Ambulatory Visit: Payer: Self-pay

## 2019-09-27 ENCOUNTER — Encounter: Payer: Self-pay | Admitting: Women's Health

## 2019-09-28 NOTE — Progress Notes (Signed)
Pt here for IUD, recent EAB, UPT still+. Abstinence, return in 2wks for BHCG AM, insertion PM This encounter was created in error - please disregard.

## 2019-10-11 ENCOUNTER — Ambulatory Visit: Payer: BLUE CROSS/BLUE SHIELD | Admitting: Women's Health

## 2019-10-11 ENCOUNTER — Other Ambulatory Visit: Payer: Medicaid Other

## 2019-10-11 ENCOUNTER — Other Ambulatory Visit: Payer: Self-pay

## 2019-10-11 DIAGNOSIS — Z3040 Encounter for surveillance of contraceptives, unspecified: Secondary | ICD-10-CM

## 2019-10-11 LAB — BETA HCG QUANT (REF LAB): hCG Quant: 22 m[IU]/mL

## 2019-10-14 ENCOUNTER — Ambulatory Visit: Payer: BLUE CROSS/BLUE SHIELD | Admitting: Advanced Practice Midwife

## 2019-10-14 ENCOUNTER — Other Ambulatory Visit: Payer: Self-pay

## 2019-10-14 DIAGNOSIS — Z30430 Encounter for insertion of intrauterine contraceptive device: Secondary | ICD-10-CM | POA: Diagnosis not present

## 2019-10-14 LAB — BETA HCG QUANT (REF LAB): hCG Quant: 15 m[IU]/mL

## 2019-10-27 ENCOUNTER — Ambulatory Visit (INDEPENDENT_AMBULATORY_CARE_PROVIDER_SITE_OTHER): Payer: BLUE CROSS/BLUE SHIELD | Admitting: Women's Health

## 2019-10-27 ENCOUNTER — Encounter: Payer: Self-pay | Admitting: Women's Health

## 2019-10-27 VITALS — BP 129/89 | HR 93 | Ht 67.0 in | Wt 306.0 lb

## 2019-10-27 DIAGNOSIS — Z3202 Encounter for pregnancy test, result negative: Secondary | ICD-10-CM

## 2019-10-27 DIAGNOSIS — Z30430 Encounter for insertion of intrauterine contraceptive device: Secondary | ICD-10-CM | POA: Insufficient documentation

## 2019-10-27 LAB — POCT URINE PREGNANCY: Preg Test, Ur: NEGATIVE

## 2019-10-27 MED ORDER — LEVONORGESTREL 19.5 MCG/DAY IU IUD: INTRAUTERINE_SYSTEM | Freq: Once | INTRAUTERINE | Status: AC

## 2019-10-27 NOTE — Progress Notes (Signed)
   IUD INSERTION Patient name: Pamela [name] MRN [id number]  Date of birth: 11-Sep-1985 Subjective Findings:   Pamela [name] is a 34 y.o. [phone] African American female being seen today for insertion of a Liletta IUD.  Depression screen [facility] 2/9 04/05/2019  Decreased Interest 0  Down, Depressed, Hopeless 0  PHQ - 2 Score 0    No LMP recorded. Last sexual intercourse was >2wks ago Last pap12/20/13. Results were:  normal  The risks and benefits of the method and placement have been thouroughly reviewed with the patient and all questions were answered.  Specifically the patient is aware of failure rate of 06/998, expulsion of the IUD and of possible perforation.  The patient is aware of irregular bleeding due to the method and understands the incidence of irregular bleeding diminishes with time.  Signed copy of informed consent in chart.  Pertinent History Reviewed:   Reviewed past medical,surgical, social, obstetrical and family history.  Reviewed problem list, medications and allergies. Objective Findings & Procedure:   Vitals:   10/27/19 1538  BP: 129/89  Pulse: 93  Weight: (!) 306 lb (138.8 kg)  Height: 5\' 7"  (1.702 m)  Body mass index is 47.93 kg/m.  Results for orders placed or performed in visit on 10/27/19 (from the past 24 hour(s))  POCT urine pregnancy   Collection Time: 10/27/19  3:51 PM  Result Value Ref Range   Preg Test, Ur Negative Negative     Time out was performed.  A graves speculum was placed in the vagina.  The cervix was visualized, prepped using Betadine, and grasped with a single tooth tenaculum. The uterus was found to be neutral and it sounded to 9 cm.  Liletta  IUD placed per manufacturer's recommendations. The strings were trimmed to approximately 3 cm. The patient tolerated the procedure well.   Informal transvaginal sonogram was performed and the proper placement of the IUD was verified.  Chaperone: 10/29/19 & Plan:   1) Liletta IUD  insertion The patient was given post procedure instructions, including signs and symptoms of infection and to check for the strings after each menses or each month, and refraining from intercourse or anything in the vagina for 3 days. She was given a care card with date IUD placed, and date IUD to be removed. She is scheduled for a f/u appointment in 4 weeks.  2) Past due for pap>schedule today  Orders Placed This Encounter  Procedures  . POCT urine pregnancy    Return in about 4 weeks (around 11/24/2019) for Pap & physical & IUD check.  11/26/2019 CNM, [facility] 10/27/2019 4:03 PM

## 2019-10-27 NOTE — Patient Instructions (Signed)
 Nothing in vagina for 3 days (no sex, douching, tampons, etc...)  Check your strings once a month to make sure you can feel them, if you are not able to please let us know  If you develop a fever of 100.4 or more in the next few weeks, or if you develop severe abdominal pain, please let us know  Use a backup method of birth control, such as condoms, for 2 weeks     Intrauterine Device Insertion, Care After  This sheet gives you information about how to care for yourself after your procedure. Your health care provider may also give you more specific instructions. If you have problems or questions, contact your health care provider. What can I expect after the procedure? After the procedure, it is common to have:  Cramps and pain in the abdomen.  Light bleeding (spotting) or heavier bleeding that is like your menstrual period. This may last for up to a few days.  Lower back pain.  Dizziness.  Headaches.  Nausea. Follow these instructions at home:  Before resuming sexual activity, check to make sure that you can feel the IUD string(s). You should be able to feel the end of the string(s) below the opening of your cervix. If your IUD string is in place, you may resume sexual activity. ? If you had a hormonal IUD inserted more than 7 days after your most recent period started, you will need to use a backup method of birth control for 7 days after IUD insertion. Ask your health care provider whether this applies to you.  Continue to check that the IUD is still in place by feeling for the string(s) after every menstrual period, or once a month.  Take over-the-counter and prescription medicines only as told by your health care provider.  Do not drive or use heavy machinery while taking prescription pain medicine.  Keep all follow-up visits as told by your health care provider. This is important. Contact a health care provider if:  You have bleeding that is heavier or lasts longer  than a normal menstrual cycle.  You have a fever.  You have cramps or abdominal pain that get worse or do not get better with medicine.  You develop abdominal pain that is new or is not in the same area of earlier cramping and pain.  You feel lightheaded or weak.  You have abnormal or bad-smelling discharge from your vagina.  You have pain during sexual activity.  You have any of the following problems with your IUD string(s): ? The string bothers or hurts you or your sexual partner. ? You cannot feel the string. ? The string has gotten longer.  You can feel the IUD in your vagina.  You think you may be pregnant, or you miss your menstrual period.  You think you may have an STI (sexually transmitted infection). Get help right away if:  You have flu-like symptoms.  You have a fever and chills.  You can feel that your IUD has slipped out of place. Summary  After the procedure, it is common to have cramps and pain in the abdomen. It is also common to have light bleeding (spotting) or heavier bleeding that is like your menstrual period.  Continue to check that the IUD is still in place by feeling for the string(s) after every menstrual period, or once a month.  Keep all follow-up visits as told by your health care provider. This is important.  Contact your health care provider   if you have problems with your IUD string(s), such as the string getting longer or bothering you or your sexual partner. This information is not intended to replace advice given to you by your health care provider. Make sure you discuss any questions you have with your health care provider. Document Revised: 05/02/2017 Document Reviewed: 04/10/2016 Elsevier Patient Education  2020 Elsevier Inc.  

## 2019-10-27 NOTE — Addendum Note (Signed)
Addended by: Federico Flake A on: 10/27/2019 04:40 PM   Modules accepted: Orders

## 2019-11-23 ENCOUNTER — Other Ambulatory Visit: Payer: Self-pay | Admitting: Adult Health

## 2019-11-23 MED ORDER — VALACYCLOVIR HCL 1 G PO TABS: 1000.0000 mg | ORAL_TABLET | Freq: Two times a day (BID) | ORAL | 1 refills | Status: DC

## 2019-11-23 NOTE — Progress Notes (Signed)
Will rx valtrex for HSV outbreak

## 2019-11-24 ENCOUNTER — Other Ambulatory Visit: Payer: Medicaid Other | Admitting: Women's Health

## 2019-12-13 ENCOUNTER — Other Ambulatory Visit: Payer: Medicaid Other | Admitting: Women's Health

## 2020-01-13 ENCOUNTER — Other Ambulatory Visit: Payer: Self-pay

## 2020-01-13 ENCOUNTER — Encounter (HOSPITAL_COMMUNITY): Payer: Self-pay | Admitting: *Deleted

## 2020-01-13 ENCOUNTER — Emergency Department (HOSPITAL_COMMUNITY)
Admission: EM | Admit: 2020-01-13 | Discharge: 2020-01-13 | Disposition: A | Discharge status: Home / Self Care | Payer: BLUE CROSS/BLUE SHIELD | Attending: Emergency Medicine | Admitting: Emergency Medicine

## 2020-01-13 DIAGNOSIS — R300 Dysuria: Secondary | ICD-10-CM | POA: Diagnosis not present

## 2020-01-13 DIAGNOSIS — R3915 Urgency of urination: Secondary | ICD-10-CM | POA: Insufficient documentation

## 2020-01-13 DIAGNOSIS — R1030 Lower abdominal pain, unspecified: Secondary | ICD-10-CM | POA: Insufficient documentation

## 2020-01-13 DIAGNOSIS — F17210 Nicotine dependence, cigarettes, uncomplicated: Secondary | ICD-10-CM | POA: Insufficient documentation

## 2020-01-13 DIAGNOSIS — N3000 Acute cystitis without hematuria: Secondary | ICD-10-CM | POA: Insufficient documentation

## 2020-01-13 LAB — URINALYSIS, ROUTINE W REFLEX MICROSCOPIC
Bilirubin Urine: NEGATIVE
Glucose, UA: NEGATIVE mg/dL
Hgb urine dipstick: NEGATIVE
Ketones, ur: NEGATIVE mg/dL
Nitrite: NEGATIVE
Protein, ur: NEGATIVE mg/dL
Specific Gravity, Urine: 1.025 (ref 1.005–1.030)
WBC, UA: >50 WBC/hpf — ABNORMAL HIGH (ref 0–5)
pH: 6 (ref 5.0–8.0)

## 2020-01-13 LAB — PREGNANCY, URINE: Preg Test, Ur: NEGATIVE

## 2020-01-13 MED ORDER — FLUCONAZOLE 200 MG PO TABS: 200.0000 mg | ORAL_TABLET | Freq: Once | ORAL | 1 refills | Status: AC

## 2020-01-13 MED ORDER — SULFAMETHOXAZOLE-TRIMETHOPRIM 800-160 MG PO TABS: 1.0000 | ORAL_TABLET | Freq: Two times a day (BID) | ORAL | 0 refills | Status: AC

## 2020-01-13 MED ORDER — SULFAMETHOXAZOLE-TRIMETHOPRIM 800-160 MG PO TABS
1.0000 | ORAL_TABLET | Freq: Once | ORAL | Status: AC
  Administered 2020-01-13: 1 via ORAL
  Filled 2020-01-13: qty 1

## 2020-01-13 NOTE — ED Triage Notes (Signed)
Pt with urinary urgency and frequency, discomfort with voiding today.  Denies fever or N/V.

## 2020-01-13 NOTE — Discharge Instructions (Addendum)
Your urine test showed that you have a urinary tract infection.  It is important that you take the antibiotic as directed until it is finished.  Drink plenty of water and you may also drink cranberry juice.  Take the Diflucan as a single dose after you have completed the antibiotics if you develop a yeast infection.  Follow-up with your primary care provider for recheck.  Return to the emergency department if you develop worsening symptoms such as pain in your back, fever, chills, or vomiting.

## 2020-01-13 NOTE — ED Provider Notes (Signed)
[facility] EMERGENCY DEPARTMENT Provider Note   CSN: [id number] Arrival date & time: 01/13/20  2143     History Chief Complaint  Patient presents with  . Dysuria    Pamela [name] is a 34 y.o. female.  HPI      Pamela [name] is a 34 y.o. female who presents to the Emergency Department complaining of urinary urgency and frequency earlier today.  She notes having sharp pain with voiding and voiding small amounts, some mild pressure to her lower abdomen.  Concerned that she has a UTI.  She denies nausea, vomiting, fever or chills.  No back pain or hematuria.  She denies vaginal bleeding or discharge.  No new sexual partners.    Past Medical History:  Diagnosis Date  . Abnormal antenatal AFP screen    [place] and [place] was low risk.  Marland Kitchen Anxiety   . Genital herpes   . Gonorrhea   . History of abnormal Pap smear   . History of postpartum depression, currently pregnant   . HSV-2 infection complicating pregnancy    Will suppress at 34 weeks. Don't discuss in front of FOB.  . Mental disorder    hx pp depression now anxiety  . Pregnant   . Tobacco abuse 01/10/2012    Patient Active Problem List   Diagnosis Date Noted  . Encounter for IUD insertion 10/27/2019  . Depression with anxiety 03/03/2019  . Gonorrhea 12/24/2013  . Genital herpes 12/22/2013  . Anxiety 07/19/2013  . Postpartum depression 11/11/2012  . History of chlamydia 08/11/2012  . History of gonorrhea 08/11/2012  . History of abnormal Pap smear 08/11/2012  . Morbid obesity (HCC) 01/10/2012  . Bradycardia 01/10/2012  . Cholelithiasis 01/09/2012  . Pancreatitis 01/09/2012    Past Surgical History:  Procedure Laterality Date  . CESAREAN SECTION  03/04/2011   Procedure: CESAREAN SECTION;  Surgeon: Lesly Dukes, MD;  Location: [facility];  Service: Gynecology;  Laterality: N/A;  primary of baby  boy at 44  . CESAREAN SECTION N/A 10/27/2012   Procedure: CESAREAN SECTION;  Surgeon: Allie Bossier, MD;  Location: [facility];   Service: Obstetrics;  Laterality: N/A;  . CHOLECYSTECTOMY  01/10/2012   Procedure: LAPAROSCOPIC CHOLECYSTECTOMY;  Surgeon: Dalia Heading, MD;  Location: [facility];  Service: General;  Laterality: N/A;  attempted Intraopertive cholangiogram  . TONSILLECTOMY       OB History    Gravida  4   Para  2   Term  2   Preterm  0   AB  2   Living  2     SAB  1   TAB  1   Ectopic  0   Multiple  0   Live Births  2           Family History  Problem Relation Age of Onset  . Nephrolithiasis Father   . Graves' disease Father   . Hypertension Other   . Cancer Other        colon    Social History   Tobacco Use  . Smoking status: Current Every Day Smoker    Packs/day: 0.25    Years: 5.00    Pack years: 1.25    Types: Cigarettes  . Smokeless tobacco: Never Used  Vaping Use  . Vaping Use: Never used  Substance Use Topics  . Alcohol use: No  . Drug use: No    Home Medications Prior to Admission medications   Medication Sig Start Date  End Date Taking? Authorizing Provider  LYSINE PO Take 1 tablet by mouth daily.   Yes [provider]  Multiple Vitamins-Minerals (HAIR SKIN AND NAILS FORMULA) TABS Take 1 tablet by mouth daily.   Yes [provider]  OREGANO PO Take 1 capsule by mouth daily.   Yes [provider]  cyclobenzaprine (FLEXERIL) 10 MG tablet Take 1 tablet (10 mg total) by mouth 3 (three) times daily as needed for muscle spasms. Patient not taking: Reported on 09/06/2019 06/01/19   Dione Booze, MD  naproxen (NAPROSYN) 500 MG tablet Take 1 tablet (500 mg total) by mouth 2 (two) times daily. Patient not taking: Reported on 09/06/2019 06/01/19   Dione Booze, MD  Prenatal Vit-Fe Fumarate-FA (PNV PRENATAL PLUS MULTIVITAMIN) 27-1 MG TABS Take 1 tablet by mouth daily. Patient not taking: Reported on 10/27/2019 04/05/19   Cresenzo-Dishmon, Scarlette Calico, CNM  valACYclovir (VALTREX) 1000 MG tablet Take 1 tablet (1,000 mg total) by mouth 2 (two) times  daily. Patient not taking: Reported on 01/13/2020 11/23/19   Adline Potter, NP    Allergies    Ceftriaxone, Lactase, and Latex  Review of Systems   Review of Systems  Constitutional: Negative for activity change, appetite change, chills and fever.  Respiratory: Negative for shortness of breath.   Gastrointestinal: Negative for abdominal pain, diarrhea, nausea and vomiting.  Genitourinary: Positive for dysuria, frequency and urgency. Negative for decreased urine volume, difficulty urinating, dyspareunia, flank pain, hematuria, vaginal bleeding and vaginal discharge.  Musculoskeletal: Negative for back pain.  Skin: Negative for rash.  Neurological: Negative for dizziness, weakness and numbness.  Hematological: Negative for adenopathy.  Psychiatric/Behavioral: Negative for confusion.    Physical Exam Updated Vital Signs BP 125/71 (BP Location: Right Arm)   Pulse 89   Temp 98.3 F (36.8 C) (Oral)   Resp 16   Ht 5' 10.5" (1.791 m)   Wt 131.5 kg   SpO2 99%   BMI 41.02 kg/m   Physical Exam Vitals and nursing note reviewed.  Constitutional:      General: She is not in acute distress.    Appearance: Normal appearance. She is not ill-appearing or toxic-appearing.  HENT:     Mouth/Throat:     Mouth: Mucous membranes are moist.  Cardiovascular:     Rate and Rhythm: Regular rhythm.  Pulmonary:     Effort: Pulmonary effort is normal. No respiratory distress.     Breath sounds: Normal breath sounds.  Abdominal:     General: There is no distension.     Palpations: Abdomen is soft.     Tenderness: There is no abdominal tenderness. There is no right CVA tenderness, left CVA tenderness or guarding.  Musculoskeletal:        General: Normal range of motion.  Skin:    General: Skin is warm.     Capillary Refill: Capillary refill takes less than 2 seconds.  Neurological:     General: No focal deficit present.     Mental Status: She is alert.     Sensory: No sensory deficit.      Motor: No weakness.     ED Results / Procedures / Treatments   Labs (all labs ordered are listed, but only abnormal results are displayed) Labs Reviewed  URINALYSIS, ROUTINE W REFLEX MICROSCOPIC - Abnormal; Notable for the following components:      Result Value   Leukocytes,Ua MODERATE (*)    WBC, UA >50 (*)    Bacteria, UA MANY (*)  All other components within normal limits  URINE CULTURE  PREGNANCY, URINE    EKG None  Radiology No results found.  Procedures Procedures (including critical care time)  Medications Ordered in ED Medications - No data to display  ED Course  I have reviewed the triage vital signs and the nursing notes.  Pertinent labs & imaging results that were available during my care of the patient were reviewed by me and considered in my medical decision making (see chart for details).    MDM Rules/Calculators/A&P                          Patient here with dysuria symptoms beginning earlier today.  She is well-appearing and nontoxic.  Vital signs reviewed.  No CVA tenderness, fever, chills, or flank pain to suggest pyelonephritis. No reported hematuria.   Urinalysis shows moderate leukocytes and greater than 50 WBCs with many bacteria present.  Urine culture is pending.  Patient reports history of shortness of breath and hives with cephalosporins.  Will treat with Bactrim.  She is appropriate for discharge home, requests a prescription for Diflucan as she gets frequent yeast infections after taking antibiotics.  Return precautions were discussed.   Final Clinical Impression(s) / ED Diagnoses Final diagnoses:  Acute cystitis without hematuria    Rx / DC Orders ED Discharge Orders    None       Rosey Bath 01/13/20 2334    Benjiman Core, MD 01/13/20 2336

## 2020-01-16 LAB — URINE CULTURE: Culture: >=100000 — AB

## 2020-01-17 ENCOUNTER — Telehealth: Payer: Self-pay | Admitting: *Deleted

## 2020-01-17 NOTE — Telephone Encounter (Signed)
Post ED Visit - Positive Culture Follow-up  Culture report reviewed by antimicrobial stewardship pharmacist: [facility] []  , Pharm.D. []  Enzo Bi, Pharm.D., BCPS AQ-ID []  , Pharm.D., BCPS []  Celedonio Miyamoto, Pharm.D., BCPS []  [place], Garvin Fila.D., BCPS, AAHIVP []  , Pharm.D., BCPS, AAHIVP []  Georgina Pillion, PharmD, BCPS []  , PharmD, BCPS []  [place], PharmD, BCPS []  [address], PharmD []  , PharmD, BCPS []  Estella Husk, PharmD  Pharmacy Team []  Lysle Pearl, PharmD []  , PharmD []  Phillips Climes, PharmD []  , Rph []  Agapito Games) , PharmD []  Verlan Friends, PharmD []  , PharmD []  Mervyn Gay, PharmD []  , PharmD []  Vinnie Level, PharmD []  [facility], PharmD []  , PharmD []  Len Childs, PharmD   Positive urine culture Treated with Sulfamethoxazle-Trimethoprim, organism sensitive to the same and no further patient follow-up is required at this time. , PharmD Greer Pickerel Talley 01/17/2020, 5:57 PM

## 2020-03-17 ENCOUNTER — Other Ambulatory Visit: Payer: Self-pay

## 2020-03-17 ENCOUNTER — Emergency Department (HOSPITAL_COMMUNITY)
Admission: EM | Admit: 2020-03-17 | Discharge: 2020-03-17 | Disposition: A | Discharge status: Home / Self Care | Payer: BLUE CROSS/BLUE SHIELD | Attending: Emergency Medicine | Admitting: Emergency Medicine

## 2020-03-17 ENCOUNTER — Encounter (HOSPITAL_COMMUNITY): Payer: Self-pay | Admitting: *Deleted

## 2020-03-17 DIAGNOSIS — Z113 Encounter for screening for infections with a predominantly sexual mode of transmission: Secondary | ICD-10-CM | POA: Diagnosis not present

## 2020-03-17 DIAGNOSIS — A64 Unspecified sexually transmitted disease: Secondary | ICD-10-CM | POA: Diagnosis not present

## 2020-03-17 DIAGNOSIS — F17210 Nicotine dependence, cigarettes, uncomplicated: Secondary | ICD-10-CM | POA: Diagnosis not present

## 2020-03-17 DIAGNOSIS — Z91040 Latex allergy status: Secondary | ICD-10-CM | POA: Insufficient documentation

## 2020-03-17 DIAGNOSIS — Z7689 Persons encountering health services in other specified circumstances: Secondary | ICD-10-CM

## 2020-03-17 LAB — WET PREP, GENITAL
Clue Cells Wet Prep HPF POC: NONE SEEN
Sperm: NONE SEEN
Trich, Wet Prep: NONE SEEN
Yeast Wet Prep HPF POC: NONE SEEN

## 2020-03-17 LAB — RAPID HIV SCREEN (HIV 1/2 AB+AG)
HIV 1/2 Antibodies: NONREACTIVE
HIV-1 P24 Antigen - HIV24: NONREACTIVE

## 2020-03-17 MED ORDER — DOXYCYCLINE HYCLATE 100 MG PO TABS
100.0000 mg | ORAL_TABLET | Freq: Once | ORAL | Status: AC
  Administered 2020-03-17: 100 mg via ORAL
  Filled 2020-03-17: qty 1

## 2020-03-17 MED ORDER — AZITHROMYCIN 200 MG/5ML PO SUSR: 2000.0000 mg | Freq: Once | ORAL | Status: DC

## 2020-03-17 MED ORDER — AZITHROMYCIN 600 MG PO TABS: 800.0000 mg | ORAL_TABLET | Freq: Once | ORAL | Status: DC

## 2020-03-17 MED ORDER — AZITHROMYCIN 250 MG PO TABS: 750.0000 mg | ORAL_TABLET | Freq: Once | ORAL | 0 refills | Status: AC

## 2020-03-17 MED ORDER — GENTAMICIN SULFATE 40 MG/ML IJ SOLN
240.0000 mg | Freq: Once | INTRAMUSCULAR | Status: AC
  Administered 2020-03-17: 240 mg via INTRAMUSCULAR
  Filled 2020-03-17: qty 6

## 2020-03-17 MED ORDER — AZITHROMYCIN 200 MG/5ML PO SUSR
1150.0000 mg | Freq: Once | ORAL | Status: AC
Start: 2020-03-17 — End: 2020-03-17
  Administered 2020-03-17: 1150 mg via ORAL

## 2020-03-17 MED ORDER — DOXYCYCLINE HYCLATE 100 MG PO CAPS: 100.0000 mg | ORAL_CAPSULE | Freq: Two times a day (BID) | ORAL | 0 refills | Status: AC

## 2020-03-17 NOTE — ED Triage Notes (Signed)
Pt's partner told her he has an STD and she has been exposed

## 2020-03-17 NOTE — ED Provider Notes (Signed)
[facility] EMERGENCY DEPARTMENT Provider Note   CSN: [id number] Arrival date & time: 03/17/20  1615     History Chief Complaint  Patient presents with  . SEXUALLY TRANSMITTED DISEASE    Pamela [name] is a 34 y.o. female with PMH significant for genital herpes and STIs presents to the ED for screening after being exposed to STI.  Patient reports that she has been in a committed relationship with a single female partner for 11 years.  He came to her today to inform her that he had been sexually full with with somebody who is for STIs.  She did not give him a chance to tell her which infection specifically, but he did state that they were "treatable".  She tells me that she has been entirely asymptomatic.  They have been sexually active this week without any dyspareunia.  She denies any fevers or chills, abdominal pain, nausea or vomiting, diminished appetite, urinary symptoms, vaginal discharge or bleeding, vaginal pain, or new rash.    HPI     Past Medical History:  Diagnosis Date  . Abnormal antenatal AFP screen    [place] and [place] was low risk.  Marland Kitchen Anxiety   . Genital herpes   . Gonorrhea   . History of abnormal Pap smear   . History of postpartum depression, currently pregnant   . HSV-2 infection complicating pregnancy    Will suppress at 34 weeks. Don't discuss in front of FOB.  . Mental disorder    hx pp depression now anxiety  . Pregnant   . Tobacco abuse 01/10/2012    Patient Active Problem List   Diagnosis Date Noted  . Encounter for IUD insertion 10/27/2019  . Depression with anxiety 03/03/2019  . Gonorrhea 12/24/2013  . Genital herpes 12/22/2013  . Anxiety 07/19/2013  . Postpartum depression 11/11/2012  . History of chlamydia 08/11/2012  . History of gonorrhea 08/11/2012  . History of abnormal Pap smear 08/11/2012  . Morbid obesity (HCC) 01/10/2012  . Bradycardia 01/10/2012  . Cholelithiasis 01/09/2012  . Pancreatitis 01/09/2012    Past Surgical History:    Procedure Laterality Date  . CESAREAN SECTION  03/04/2011   Procedure: CESAREAN SECTION;  Surgeon: Lesly Dukes, MD;  Location: [facility];  Service: Gynecology;  Laterality: N/A;  primary of baby  boy at 68  . CESAREAN SECTION N/A 10/27/2012   Procedure: CESAREAN SECTION;  Surgeon: Allie Bossier, MD;  Location: [facility];  Service: Obstetrics;  Laterality: N/A;  . CHOLECYSTECTOMY  01/10/2012   Procedure: LAPAROSCOPIC CHOLECYSTECTOMY;  Surgeon: Dalia Heading, MD;  Location: [facility];  Service: General;  Laterality: N/A;  attempted Intraopertive cholangiogram  . TONSILLECTOMY       OB History    Gravida  4   Para  2   Term  2   Preterm  0   AB  2   Living  2     SAB  1   TAB  1   Ectopic  0   Multiple  0   Live Births  2           Family History  Problem Relation Age of Onset  . Nephrolithiasis Father   . Graves' disease Father   . Hypertension Other   . Cancer Other        colon    Social History   Tobacco Use  . Smoking status: Current Every Day Smoker    Packs/day: 0.25    Years: 5.00  Pack years: 1.25    Types: Cigarettes  . Smokeless tobacco: Never Used  Vaping Use  . Vaping Use: Never used  Substance Use Topics  . Alcohol use: No  . Drug use: No    Home Medications Prior to Admission medications   Medication Sig Start Date End Date Taking? Authorizing Provider  cyclobenzaprine (FLEXERIL) 10 MG tablet Take 1 tablet (10 mg total) by mouth 3 (three) times daily as needed for muscle spasms. Patient not taking: Reported on 09/06/2019 06/01/19   Dione Booze, MD  doxycycline (VIBRAMYCIN) 100 MG capsule Take 1 capsule (100 mg total) by mouth 2 (two) times daily for 7 days. 03/17/20 03/24/20  Lorelee New, PA-C  LYSINE PO Take 1 tablet by mouth daily.    [provider]  Multiple Vitamins-Minerals (HAIR SKIN AND NAILS FORMULA) TABS Take 1 tablet by mouth daily.    [provider]  naproxen (NAPROSYN) 500 MG tablet Take 1 tablet (500  mg total) by mouth 2 (two) times daily. Patient not taking: Reported on 09/06/2019 06/01/19   Dione Booze, MD  OREGANO PO Take 1 capsule by mouth daily.    [provider]  Prenatal Vit-Fe Fumarate-FA (PNV PRENATAL PLUS MULTIVITAMIN) 27-1 MG TABS Take 1 tablet by mouth daily. Patient not taking: Reported on 10/27/2019 04/05/19   Cresenzo-Dishmon, Scarlette Calico, CNM  valACYclovir (VALTREX) 1000 MG tablet Take 1 tablet (1,000 mg total) by mouth 2 (two) times daily. Patient not taking: Reported on 01/13/2020 11/23/19   Adline Potter, NP    Allergies    Ceftriaxone, Lactase, and Latex  Review of Systems   Review of Systems  Constitutional: Negative for fever.  Gastrointestinal: Negative for abdominal pain and nausea.  Genitourinary: Negative for dyspareunia, dysuria, vaginal discharge and vaginal pain.    Physical Exam Updated Vital Signs BP 133/62   Pulse 78   Temp 98.2 F (36.8 C) (Oral)   Resp 16   Ht 5' 10.5" (1.791 m)   Wt 123.8 kg   SpO2 100%   BMI 38.62 kg/m   Physical Exam Vitals and nursing note reviewed. Exam conducted with a chaperone present.  Constitutional:      Appearance: Normal appearance.  HENT:     Head: Normocephalic and atraumatic.  Eyes:     General: No scleral icterus.    Conjunctiva/sclera: Conjunctivae normal.  Cardiovascular:     Rate and Rhythm: Normal rate and regular rhythm.     Pulses: Normal pulses.     Heart sounds: Normal heart sounds.  Pulmonary:     Effort: Pulmonary effort is normal.     Breath sounds: Normal breath sounds.  Abdominal:     General: Abdomen is flat. There is no distension.     Palpations: Abdomen is soft.     Tenderness: There is no abdominal tenderness. There is no guarding.  Genitourinary:    Comments: External vagina: Normal. Speculum exam: Thin white discharge in vaginal vault.  Cervix appears nonerythematous, nonfriable. Bimanual exam: No CMT or adnexal tenderness.  No masses. Skin:    General: Skin is  dry.     Capillary Refill: Capillary refill takes less than 2 seconds.  Neurological:     Mental Status: She is alert and oriented to person, place, and time.     GCS: GCS eye subscore is 4. GCS verbal subscore is 5. GCS motor subscore is 6.  Psychiatric:        Mood and Affect: Mood normal.  Behavior: Behavior normal.        Thought Content: Thought content normal.     ED Results / Procedures / Treatments   Labs (all labs ordered are listed, but only abnormal results are displayed) Labs Reviewed  WET PREP, GENITAL - Abnormal; Notable for the following components:      Result Value   WBC, Wet Prep HPF POC FEW (*)    All other components within normal limits  RAPID HIV SCREEN (HIV 1/2 AB+AG)  RPR  GC/CHLAMYDIA PROBE AMP ([place]) NOT AT [facility]    EKG None  Radiology No results found.  Procedures Procedures (including critical care time)  Medications Ordered in ED Medications  azithromycin (ZITHROMAX) 200 MG/5ML suspension 2,000 mg (has no administration in time range)  doxycycline (VIBRA-TABS) tablet 100 mg (100 mg Oral Given 03/17/20 1839)  gentamicin (GARAMYCIN) injection 240 mg (240 mg Intramuscular Given 03/17/20 1838)    ED Course  I have reviewed the triage vital signs and the nursing notes.  Pertinent labs & imaging results that were available during my care of the patient were reviewed by me and considered in my medical decision making (see chart for details).    MDM Rules/Calculators/A&P                          Patient to be discharged with instructions to follow up with OBGYN. Discussed importance of using protection when sexually active. Pt understands that they have GC/Chlamydia cultures pending and that they will need to inform all sexual partners if results return positive. Pt has been treated prophylacticly with doxycyline as well as gentamicin and azithromycin for gonorrhea given patient's Rocephin allergy. Pt not concerning for PID because  hemodynamically stable and no cervical motion tenderness on pelvic exam.   Wet prep was negative for trichomoniasis.  Few WBC seen.  Did not obtain UA given lack of urinary symptoms.  In fact, she is entirely asymptomatic.  Given that her significant other admits to unprotected sexual intercourse with another woman who is tested positive for STIs, treated empirically for gonorrhea and chlamydia.  Physical exam is entirely reassuring.  ED return precautions discussed.  Patient voices understanding and is agreeable to the plan.  Final Clinical Impression(s) / ED Diagnoses Final diagnoses:  Encounter for assessment of STD exposure    Rx / DC Orders ED Discharge Orders         Ordered    doxycycline (VIBRAMYCIN) 100 MG capsule  2 times daily        03/17/20 1851           Lorelee New, PA-C 03/17/20 Amalia Greenhouse, MD 03/18/20 1825

## 2020-03-17 NOTE — ED Notes (Signed)
Pt stated she did not have time to take all of medication due to waiting on pharmacy. Took suspension . edp ordered rest of treatment dose to pharmacy

## 2020-03-17 NOTE — ED Notes (Signed)
STD exposure by partner.

## 2020-03-17 NOTE — Discharge Instructions (Addendum)
You have been treated presumptively today for gonorrhea and you have been prescribed medication to cover for chlamydia.  Please take your antibiotic, as prescribed.  Take with food.  You have been tested today for gonorrhea and chlamydia. These results will be available in approximately 3 days. You may check your MyChart account for results. Please inform all sexual partners of positive results and that they should be tested and treated as well.  Please wait 2 weeks and be sure that you and your partners are symptom free before returning to sexual activity. Please use protection with every sexual encounter.  Follow Up: Please followup with your primary doctor in 3 days for discussion of your diagnoses and further evaluation after today's visit; if you do not have a primary care doctor use the resource guide provided to find one; Please return to the ER for worsening symptoms, high fevers or persistent vomiting.  

## 2020-03-18 LAB — RPR: RPR Ser Ql: NONREACTIVE

## 2020-03-20 LAB — GC/CHLAMYDIA PROBE AMP (~~LOC~~) NOT AT ARMC
Chlamydia: NEGATIVE
Comment: NEGATIVE
Comment: NORMAL
Neisseria Gonorrhea: NEGATIVE

## 2020-07-15 IMAGING — CR PORTABLE CHEST - 1 VIEW
1 series · 2 of 2 positions shown · non-contrast
Comparison: 10/08/2011

CLINICAL DATA: Shortness of breath, sore throat, fatigued

EXAM:
PORTABLE CHEST 1 VIEW

[Series 2: portable · 0.17mm/px · 2 of 2 slices shown]
[im 1/2]
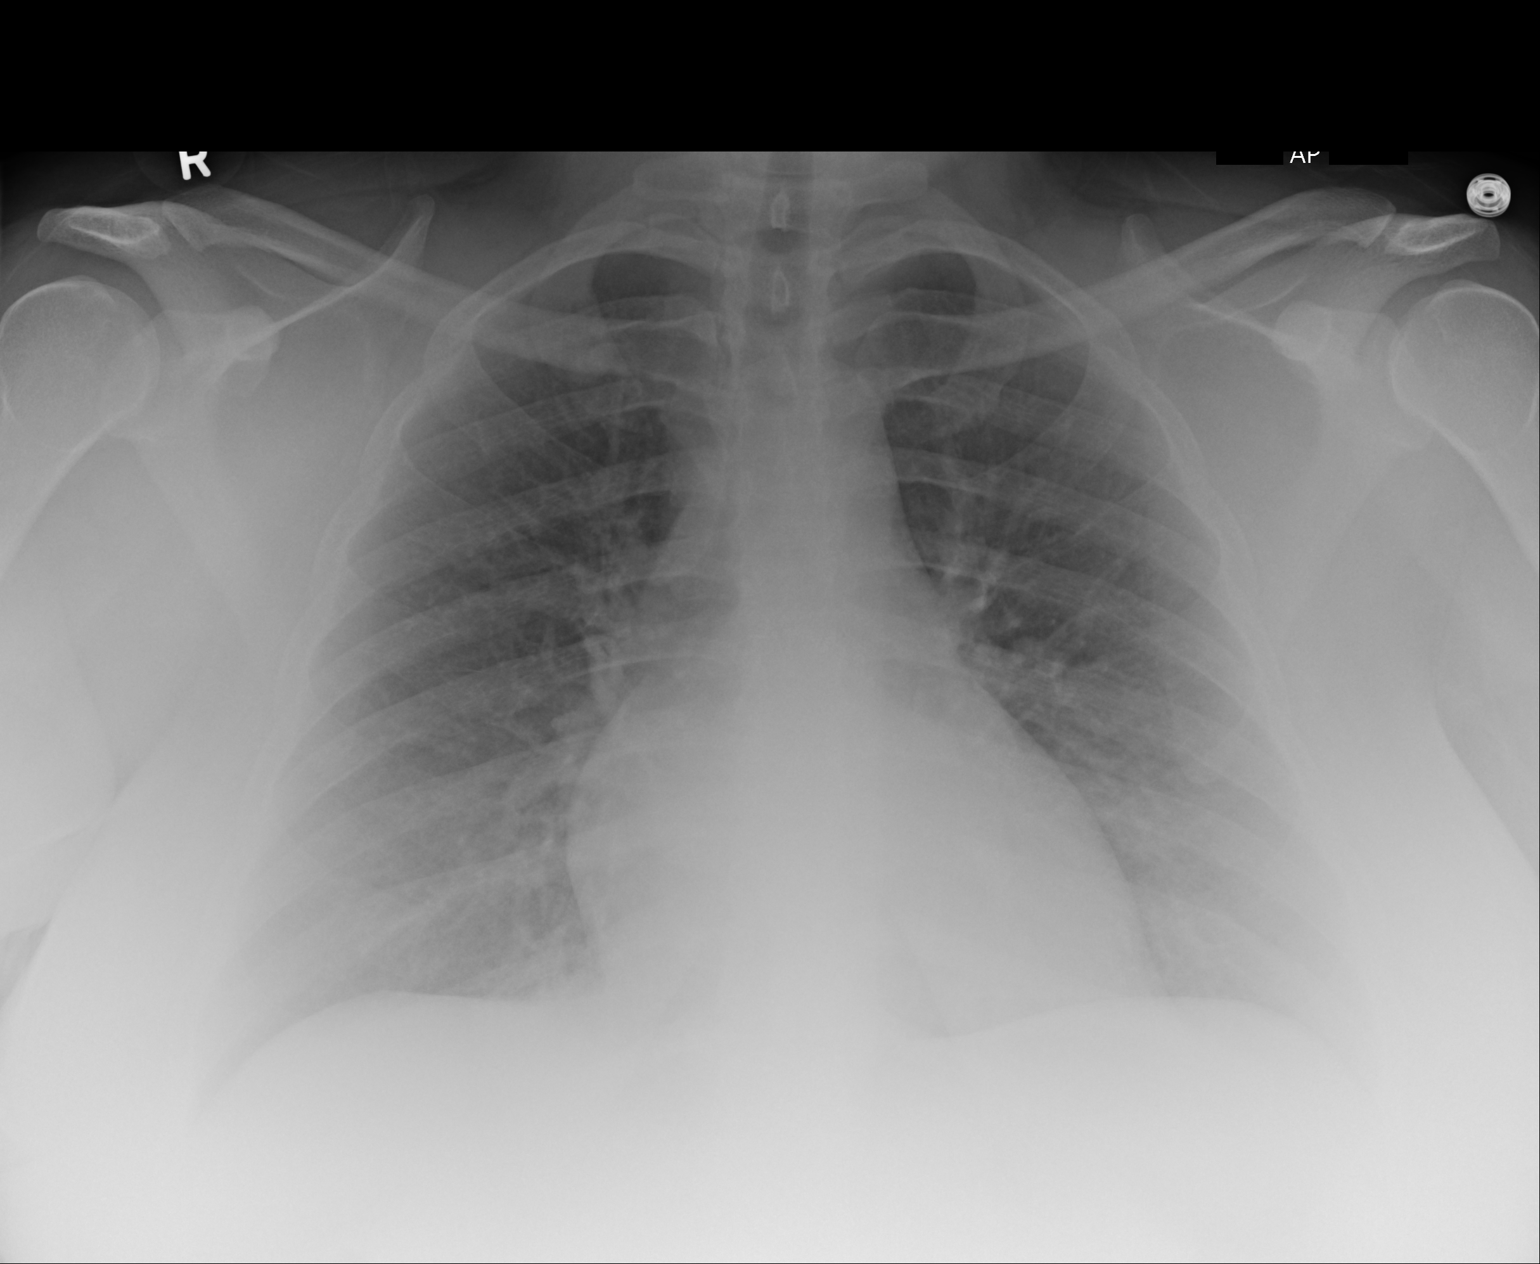
[im 2/2]
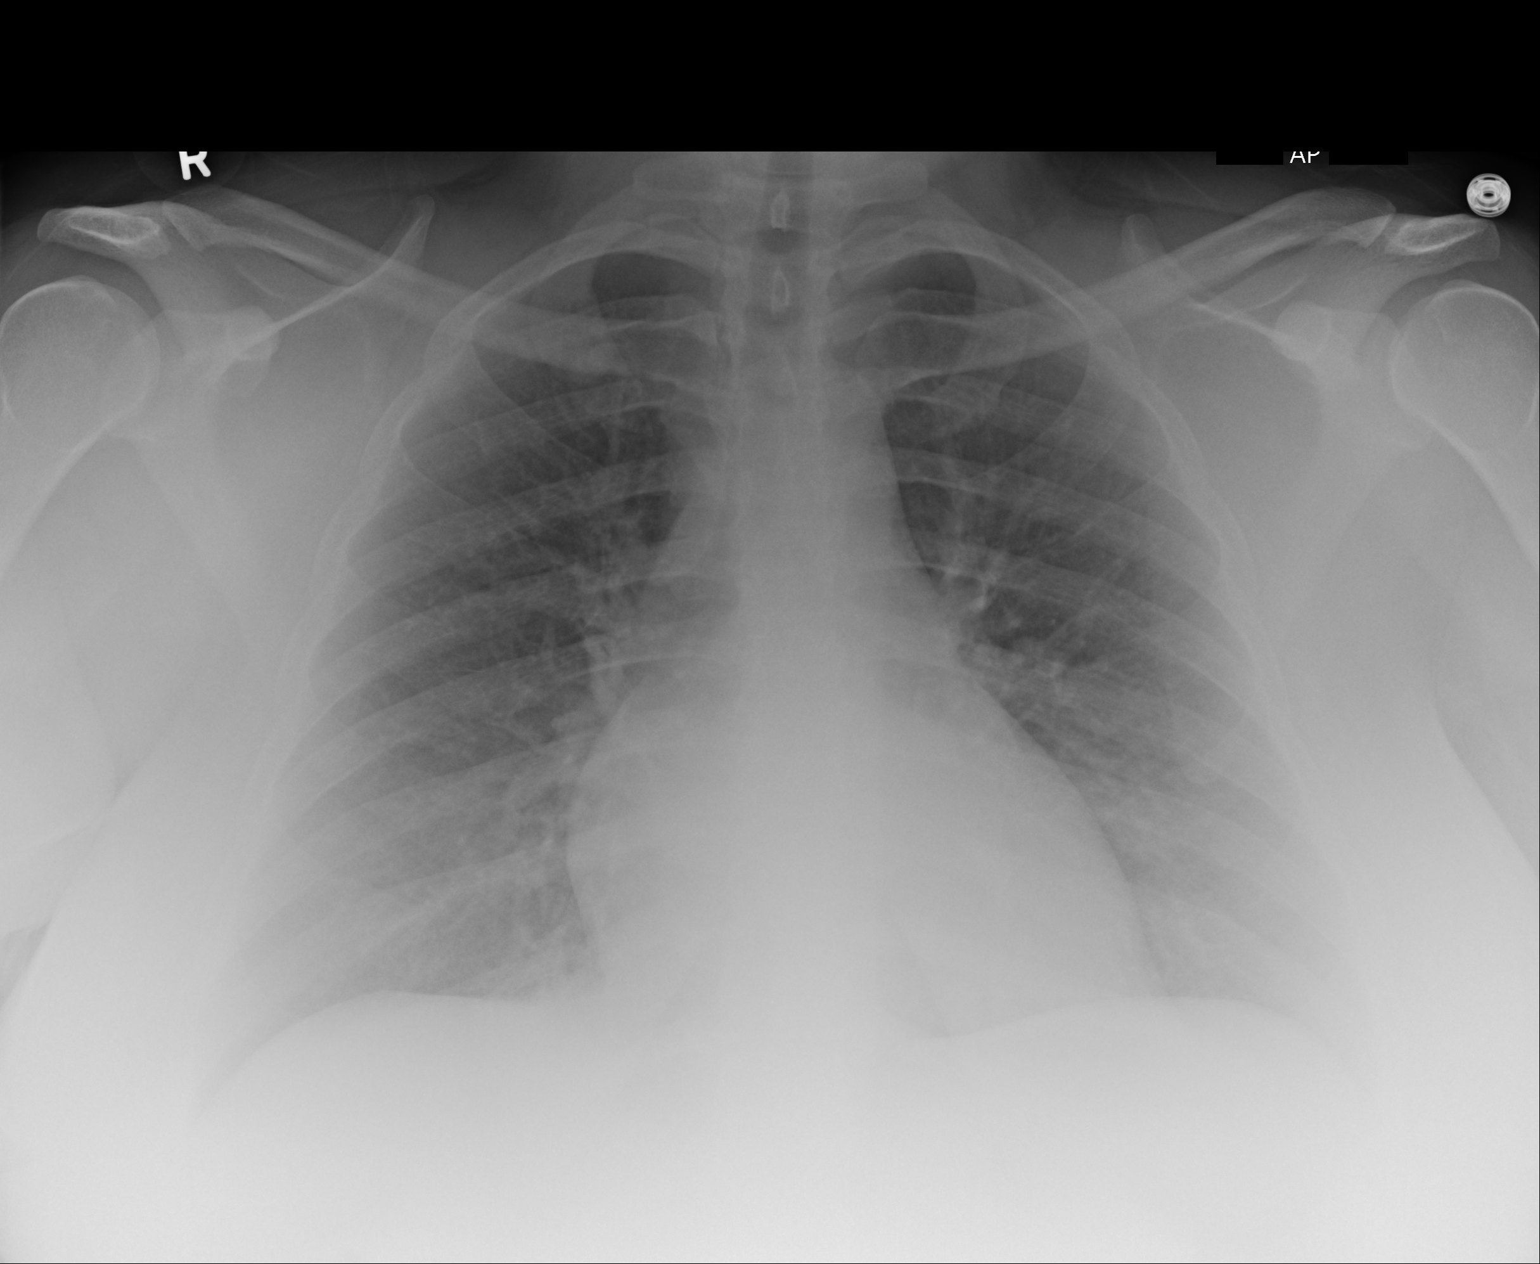

[2 of 2 positions shown; findings below may reference images not displayed]

FINDINGS: The heart size and mediastinal contours are within normal limits.
Both lungs are clear. The visualized skeletal structures are
unremarkable.
IMPRESSION: No active disease.

## 2020-09-10 DIAGNOSIS — Z202 Contact with and (suspected) exposure to infections with a predominantly sexual mode of transmission: Secondary | ICD-10-CM | POA: Diagnosis not present

## 2020-09-10 DIAGNOSIS — R030 Elevated blood-pressure reading, without diagnosis of hypertension: Secondary | ICD-10-CM | POA: Diagnosis not present

## 2021-01-12 ENCOUNTER — Encounter: Payer: Self-pay | Admitting: Adult Health

## 2021-01-12 ENCOUNTER — Ambulatory Visit (INDEPENDENT_AMBULATORY_CARE_PROVIDER_SITE_OTHER): Payer: BC Managed Care – PPO | Admitting: Adult Health

## 2021-01-12 ENCOUNTER — Other Ambulatory Visit: Payer: Self-pay

## 2021-01-12 VITALS — BP 130/96 | HR 98 | Ht 70.0 in | Wt 324.5 lb

## 2021-01-12 DIAGNOSIS — R102 Pelvic and perineal pain: Secondary | ICD-10-CM | POA: Diagnosis not present

## 2021-01-12 DIAGNOSIS — Z30432 Encounter for removal of intrauterine contraceptive device: Secondary | ICD-10-CM

## 2021-01-12 DIAGNOSIS — Z3042 Encounter for surveillance of injectable contraceptive: Secondary | ICD-10-CM

## 2021-01-12 DIAGNOSIS — Z3202 Encounter for pregnancy test, result negative: Secondary | ICD-10-CM

## 2021-01-12 DIAGNOSIS — Z113 Encounter for screening for infections with a predominantly sexual mode of transmission: Secondary | ICD-10-CM

## 2021-01-12 LAB — POCT URINE PREGNANCY: Preg Test, Ur: NEGATIVE

## 2021-01-12 MED ORDER — VALACYCLOVIR HCL 1 G PO TABS: 1000.0000 mg | ORAL_TABLET | Freq: Two times a day (BID) | ORAL | 1 refills | Status: DC

## 2021-01-12 MED ORDER — METRONIDAZOLE 500 MG PO TABS: 500.0000 mg | ORAL_TABLET | Freq: Two times a day (BID) | ORAL | 0 refills | Status: DC

## 2021-01-12 NOTE — Progress Notes (Signed)
  Subjective:     Patient ID: Pamela [name], female   DOB: April 14, 1986, 35 y.o.   MRN: [id number]  HPI Pamela [name] is a 35 year old black female,single, [id number] in complaining of pelvic pain and spotting with IUD wants it removed. PCP [facility].  Review of Systems Has pelvic pain and spotting with IUD, wants it removed Vaginal odor at times Reviewed past medical,surgical, social and family history. Reviewed medications and allergies.     Objective:   Physical Exam BP (!) 130/96 (BP Location: Left Arm, Patient Position: Sitting, Cuff Size: Large)   Pulse 98   Ht 5\' 10"  (1.778 m)   Wt (!) 324 lb 8 oz (147.2 kg)   BMI 46.56 kg/m     UPT is negative, Consent signed and time out called. Skin warm and dry.Pelvic: external genitalia is normal in appearance no lesions, vagina: scant discharge with odor,urethra has no lesions or masses noted, cervix:smooth and bulbous, IUD strings seen and grasped with forceps, asked to cough and IUD easily removed,uterus: normal size, shape and contour, non tender, no masses felt, adnexa: no masses or tenderness noted. Bladder is non tender and no masses felt.  Fall risk is moderate  Upstream - 01/12/21 1310       Pregnancy Intention Screening   Does the patient want to become pregnant in the next year? No    Does the patient's partner want to become pregnant in the next year? No    Would the patient like to discuss contraceptive options today? Yes      Contraception Wrap Up   Current Method IUD or IUS    End Method Female Condom    Contraception Counseling Provided Yes            Examination chaperoned by 03/14/21 LPN  Assessment:    1. Pregnancy examination or test, negative result   2. Encounter for IUD removal   3. Screen for STD (sexually transmitted disease) Check GC/CHL on urine  4. Pelvic pain     Plan:    USE condoms if has sex She requests refills on valtrex and flagyl Meds ordered this encounter  Medications   metroNIDAZOLE (FLAGYL)  500 MG tablet    Sig: Take 1 tablet (500 mg total) by mouth 2 (two) times daily.    Dispense:  14 tablet    Refill:  0    Order Specific Question:   Supervising Provider    Answer:   Malachy Mood H [[id number]]   valACYclovir (VALTREX) 1000 MG tablet    Sig: Take 1 tablet (1,000 mg total) by mouth 2 (two) times daily.    Dispense:  20 tablet    Refill:  1    Order Specific Question:   Supervising Provider    Answer:   Duane Lope H [[id number]]   Return in 4 weeks for pap and physical

## 2021-01-16 LAB — GC/CHLAMYDIA PROBE AMP
Chlamydia trachomatis, NAA: NEGATIVE
Neisseria Gonorrhoeae by PCR: NEGATIVE

## 2021-01-17 ENCOUNTER — Other Ambulatory Visit: Payer: Self-pay | Admitting: Adult Health

## 2021-01-17 MED ORDER — METRONIDAZOLE 0.75 % VA GEL: 1.0000 | Freq: Every day | VAGINAL | 0 refills | Status: DC

## 2021-01-17 NOTE — Progress Notes (Signed)
Rx metrogel  

## 2021-02-12 DIAGNOSIS — L600 Ingrowing nail: Secondary | ICD-10-CM | POA: Diagnosis not present

## 2021-02-12 DIAGNOSIS — B079 Viral wart, unspecified: Secondary | ICD-10-CM | POA: Diagnosis not present

## 2021-02-12 DIAGNOSIS — M79675 Pain in left toe(s): Secondary | ICD-10-CM | POA: Diagnosis not present

## 2021-02-16 ENCOUNTER — Other Ambulatory Visit (HOSPITAL_COMMUNITY)
Admission: RE | Admit: 2021-02-16 | Discharge: 2021-02-16 | Disposition: A | Discharge status: Home / Self Care | Payer: BC Managed Care – PPO | Source: Ambulatory Visit | Attending: Adult Health | Admitting: Adult Health

## 2021-02-16 ENCOUNTER — Other Ambulatory Visit: Payer: Self-pay

## 2021-02-16 ENCOUNTER — Ambulatory Visit (INDEPENDENT_AMBULATORY_CARE_PROVIDER_SITE_OTHER): Payer: BC Managed Care – PPO | Admitting: Adult Health

## 2021-02-16 ENCOUNTER — Encounter: Payer: Self-pay | Admitting: Adult Health

## 2021-02-16 VITALS — BP 129/86 | HR 87 | Ht 70.0 in | Wt 327.0 lb

## 2021-02-16 DIAGNOSIS — Z30018 Encounter for initial prescription of other contraceptives: Secondary | ICD-10-CM

## 2021-02-16 DIAGNOSIS — Z01419 Encounter for gynecological examination (general) (routine) without abnormal findings: Secondary | ICD-10-CM | POA: Insufficient documentation

## 2021-02-16 DIAGNOSIS — Z3202 Encounter for pregnancy test, result negative: Secondary | ICD-10-CM | POA: Diagnosis not present

## 2021-02-16 DIAGNOSIS — Z0000 Encounter for general adult medical examination without abnormal findings: Secondary | ICD-10-CM | POA: Insufficient documentation

## 2021-02-16 DIAGNOSIS — F418 Other specified anxiety disorders: Secondary | ICD-10-CM

## 2021-02-16 LAB — POCT URINE PREGNANCY: Preg Test, Ur: NEGATIVE

## 2021-02-16 MED ORDER — PHEXXI 1.8-1-0.4 % VA GEL: VAGINAL | 0 refills | Status: DC

## 2021-02-16 MED ORDER — TRAZODONE HCL 50 MG PO TABS: 50.0000 mg | ORAL_TABLET | Freq: Every day | ORAL | 2 refills | Status: DC

## 2021-02-16 NOTE — Progress Notes (Signed)
Patient ID: ESTELA VINAL, female   DOB: 03-09-1986, 35 y.o.   MRN: [id number] History of Present Illness: Braylee is a 35 year old black female,single, [id number] in for well woman gyn exam and pap. PCP is [facility].   Current Medications, Allergies, Past Medical History, Past Surgical History, Family History and Social History were reviewed in [employer] record.     Review of Systems: Patient denies any headaches, hearing loss, fatigue, blurred vision, shortness of breath, chest pain, abdominal pain, problems with bowel movements, urination, or intercourse. No joint pain or mood swings.  Bleed after IUD removed, none since Does not sleep well  Physical Exam:BP 129/86 (BP Location: Right Arm, Patient Position: Sitting, Cuff Size: Normal)   Pulse 87   Ht 5\' 10"  (1.778 m)   Wt (!) 327 lb (148.3 kg)   BMI 46.92 kg/m  UPT is negative. General:  Well developed, well nourished, no acute distress Skin:  Warm and dry Neck:  Midline trachea, normal thyroid, good ROM, no lymphadenopathy Lungs; Clear to auscultation bilaterally Breast:  No dominant palpable mass, retraction, or nipple discharge,has nipple rods, bilaterally Cardiovascular: Regular rate and rhythm Abdomen:  Soft, non tender, no hepatosplenomegaly Pelvic:  External genitalia is normal in appearance, no lesions.  The vagina is normal in appearance. Urethra has no lesions or masses. The cervix is bulbous. Pap with GC/CHL and HR HPV genotyping performed. Uterus is felt to be normal size, shape, and contour.  No adnexal masses or tenderness noted.Bladder is non tender, no masses felt. Extremities/musculoskeletal:  No swelling or varicosities noted, no clubbing or cyanosis Psych:  No mood changes, alert and cooperative,seems happy AA is 3 Fall risk is moderate Depression screen [facility] 2/9 02/16/2021 04/05/2019  Decreased Interest 1 0  Down, Depressed, Hopeless 1 0  PHQ - 2 Score 2 0  Altered sleeping 3 -  Tired, decreased  energy 1 -  Change in appetite 2 -  Feeling bad or failure about yourself  1 -  Trouble concentrating 1 -  Moving slowly or fidgety/restless 1 -  Suicidal thoughts 0 -  PHQ-9 Score 11 -    GAD 7 : Generalized Anxiety Score 02/16/2021  Nervous, Anxious, on Edge 1  Control/stop worrying 1  Worry too much - different things 1  Trouble relaxing 1  Restless 1  Easily annoyed or irritable 1  Afraid - awful might happen 0  Total GAD 7 Score 6      Upstream - 02/16/21 1238       Pregnancy Intention Screening   Does the patient want to become pregnant in the next year? Ok Either Way    Does the patient's partner want to become pregnant in the next year? Ok Either Way    Would the patient like to discuss contraceptive options today? Yes      Contraception Wrap Up   Current Method Female Condom    End Method Female Condom   phexxi   Contraception Counseling Provided Yes            Examination chaperoned by 02/18/21 LPN  Impression and Plan: 1. Pregnancy examination or test, negative result   2. Encounter for gynecological examination with Papanicolaou smear of cervix Pap sent Physical in 1 year Pap in 3 if normal  3. Routine general medical examination at a health care facility   4. Encounter for initial prescription of other contraceptives Given 2 sample boxes Phexxi and use condoms   5. Anxiety and  depression Will try trazodone  Meds ordered this encounter  Medications   Lactic Ac-Citric Ac-Pot Bitart (PHEXXI) 1.8-1-0.4 % GEL    Sig: Use as directed before sex    Dispense:  5 g    Refill:  0    Order Specific Question:   Supervising Provider    Answer:   Duane Lope H [[id number]]   traZODone (DESYREL) 50 MG tablet    Sig: Take 1 tablet (50 mg total) by mouth at bedtime.    Dispense:  30 tablet    Refill:  2    Order Specific Question:   Supervising Provider    Answer:   Lazaro Arms [[id number]]    Let me know in about 4 weeks how this is working

## 2021-02-22 LAB — CYTOLOGY - PAP
Adequacy: ABSENT
Chlamydia: NEGATIVE
Comment: NEGATIVE
Comment: NEGATIVE
Comment: NEGATIVE
Comment: NORMAL
Diagnosis: NEGATIVE
HPV 16: NEGATIVE
HPV 18 / 45: NEGATIVE
High risk HPV: POSITIVE — AB
Neisseria Gonorrhea: NEGATIVE

## 2021-02-26 ENCOUNTER — Encounter: Payer: Self-pay | Admitting: Adult Health

## 2021-02-26 DIAGNOSIS — R87810 Cervical high risk human papillomavirus (HPV) DNA test positive: Secondary | ICD-10-CM

## 2021-02-26 HISTORY — DX: Cervical high risk human papillomavirus (HPV) DNA test positive: R87.810

## 2021-03-10 ENCOUNTER — Other Ambulatory Visit: Payer: Self-pay | Admitting: Adult Health

## 2021-05-16 ENCOUNTER — Other Ambulatory Visit: Payer: Self-pay | Admitting: Adult Health

## 2021-05-16 ENCOUNTER — Other Ambulatory Visit: Payer: Self-pay

## 2021-05-16 ENCOUNTER — Ambulatory Visit (INDEPENDENT_AMBULATORY_CARE_PROVIDER_SITE_OTHER): Payer: BC Managed Care – PPO | Admitting: Adult Health

## 2021-05-16 ENCOUNTER — Encounter: Payer: Self-pay | Admitting: Adult Health

## 2021-05-16 VITALS — BP 133/90 | HR 83 | Ht 70.0 in | Wt 329.0 lb

## 2021-05-16 DIAGNOSIS — Z131 Encounter for screening for diabetes mellitus: Secondary | ICD-10-CM | POA: Diagnosis not present

## 2021-05-16 DIAGNOSIS — N907 Vulvar cyst: Secondary | ICD-10-CM

## 2021-05-16 DIAGNOSIS — L739 Follicular disorder, unspecified: Secondary | ICD-10-CM | POA: Insufficient documentation

## 2021-05-16 MED ORDER — MUPIROCIN 2 % EX OINT: 1.0000 "application " | TOPICAL_OINTMENT | Freq: Two times a day (BID) | CUTANEOUS | 0 refills | Status: DC

## 2021-05-16 MED ORDER — MUPIROCIN CALCIUM 2 % EX CREA: 1.0000 "application " | TOPICAL_CREAM | Freq: Two times a day (BID) | CUTANEOUS | 0 refills | Status: DC

## 2021-05-16 MED ORDER — FLUCONAZOLE 150 MG PO TABS: ORAL_TABLET | ORAL | 1 refills | Status: DC

## 2021-05-16 MED ORDER — DOXYCYCLINE HYCLATE 100 MG PO TABS: 100.0000 mg | ORAL_TABLET | Freq: Two times a day (BID) | ORAL | 0 refills | Status: DC

## 2021-05-16 NOTE — Progress Notes (Signed)
Bactroban cream not covered, changed to ointment

## 2021-05-16 NOTE — Progress Notes (Signed)
°  Subjective:     Patient ID: Pamela [name], female   DOB: 05/21/1986, 35 y.o.   MRN: [id number]  HPI Pamela [name] is a 35 year old black female, single, [id number], in complaining of bumps on mons pubis and under left breast. She did wax. PCP is [facility]. Lab Results  Component Value Date   DIAGPAP  02/16/2021    - Negative for Intraepithelial Lesions or Malignancy (NILM)   HPVHIGH Positive (A) 02/16/2021    Review of Systems +bumps under left breast and on mons pubis Reviewed past medical,surgical, social and family history. Reviewed medications and allergies.     Objective:   Physical Exam BP 133/90 (BP Location: Left Arm, Patient Position: Sitting, Cuff Size: Normal)    Pulse 83    Ht 5\' 10"  (1.778 m)    Wt (!) 329 lb (149.2 kg)    LMP 05/16/2021 (Exact Date)    BMI 47.21 kg/m     Skin is warm and dry. Has hair bumps and folliculitis on mons pubis, ans several epidermal cysts right labia. Has area under left breast too, healing.  Upstream - 05/16/21 1407       Pregnancy Intention Screening   Does the patient want to become pregnant in the next year? No    Does the patient's partner want to become pregnant in the next year? No    Would the patient like to discuss contraceptive options today? No      Contraception Wrap Up   Current Method Female Condom    End Method Female Condom    Contraception Counseling Provided No            Examination chaperoned by 05/18/21 RN.  Assessment:     1. Folliculitis Use antibacterial bar soap Clip, do not shave or wax Will rx doxycycline and Bactroban and she requests diflucan Meds ordered this encounter  Medications   fluconazole (DIFLUCAN) 150 MG tablet    Sig: Take 1 now and 1 in 3 days    Dispense:  2 tablet    Refill:  1    Order Specific Question:   Supervising Provider    Answer:   Clint Bolder H [[id number]]   mupirocin cream (BACTROBAN) 2 %    Sig: Apply 1 application topically 2 (two) times daily.    Dispense:  15 g    Refill:  0     Order Specific Question:   Supervising Provider    Answer:   Duane Lope, LUTHER H [[id number]]   doxycycline (VIBRA-TABS) 100 MG tablet    Sig: Take 1 tablet (100 mg total) by mouth 2 (two) times daily.    Dispense:  20 tablet    Refill:  0    Order Specific Question:   Supervising Provider    Answer:   Despina Hidden, LUTHER H [[id number]]     2. Morbid obesity (HCC)  3. Epidermal cyst of vulva Leave alone   4. Screening for diabetes mellitus Will check A1c    Plan:     Follow up prn

## 2021-05-17 LAB — HEMOGLOBIN A1C
Est. average glucose Bld gHb Est-mCnc: 126 mg/dL
Hgb A1c MFr Bld: 6 % — ABNORMAL HIGH (ref 4.8–5.6)

## 2021-07-13 ENCOUNTER — Other Ambulatory Visit: Payer: Self-pay

## 2021-07-13 MED ORDER — DOXYCYCLINE HYCLATE 100 MG PO TABS: 100.0000 mg | ORAL_TABLET | Freq: Two times a day (BID) | ORAL | 0 refills | Status: DC

## 2021-07-21 DIAGNOSIS — Z91011 Allergy to milk products: Secondary | ICD-10-CM | POA: Diagnosis not present

## 2021-07-21 DIAGNOSIS — R0602 Shortness of breath: Secondary | ICD-10-CM | POA: Diagnosis not present

## 2021-07-21 DIAGNOSIS — R0789 Other chest pain: Secondary | ICD-10-CM | POA: Diagnosis not present

## 2021-07-21 DIAGNOSIS — R000 Tachycardia, unspecified: Secondary | ICD-10-CM | POA: Diagnosis not present

## 2021-07-21 DIAGNOSIS — M7989 Other specified soft tissue disorders: Secondary | ICD-10-CM | POA: Diagnosis not present

## 2021-07-21 DIAGNOSIS — I517 Cardiomegaly: Secondary | ICD-10-CM | POA: Diagnosis not present

## 2021-07-21 DIAGNOSIS — Z881 Allergy status to other antibiotic agents status: Secondary | ICD-10-CM | POA: Diagnosis not present

## 2021-07-21 DIAGNOSIS — R079 Chest pain, unspecified: Secondary | ICD-10-CM | POA: Diagnosis not present

## 2021-07-21 DIAGNOSIS — J811 Chronic pulmonary edema: Secondary | ICD-10-CM | POA: Diagnosis not present

## 2021-07-21 DIAGNOSIS — Z91040 Latex allergy status: Secondary | ICD-10-CM | POA: Diagnosis not present

## 2021-07-21 DIAGNOSIS — Z79899 Other long term (current) drug therapy: Secondary | ICD-10-CM | POA: Diagnosis not present

## 2021-09-01 ENCOUNTER — Other Ambulatory Visit: Payer: Self-pay

## 2021-09-01 ENCOUNTER — Encounter (HOSPITAL_COMMUNITY): Payer: Self-pay | Admitting: *Deleted

## 2021-09-01 ENCOUNTER — Emergency Department (HOSPITAL_COMMUNITY)
Admission: EM | Admit: 2021-09-01 | Discharge: 2021-09-01 | Discharge status: Against Medical Advice | Payer: BC Managed Care – PPO | Source: Home / Self Care | Attending: Emergency Medicine | Admitting: Emergency Medicine

## 2021-09-01 DIAGNOSIS — I5031 Acute diastolic (congestive) heart failure: Secondary | ICD-10-CM | POA: Diagnosis not present

## 2021-09-01 DIAGNOSIS — D638 Anemia in other chronic diseases classified elsewhere: Secondary | ICD-10-CM | POA: Diagnosis not present

## 2021-09-01 DIAGNOSIS — J810 Acute pulmonary edema: Secondary | ICD-10-CM | POA: Diagnosis not present

## 2021-09-01 DIAGNOSIS — R509 Fever, unspecified: Secondary | ICD-10-CM | POA: Insufficient documentation

## 2021-09-01 DIAGNOSIS — E0500 Thyrotoxicosis with diffuse goiter without thyrotoxic crisis or storm: Secondary | ICD-10-CM | POA: Diagnosis not present

## 2021-09-01 DIAGNOSIS — R0602 Shortness of breath: Secondary | ICD-10-CM | POA: Insufficient documentation

## 2021-09-01 DIAGNOSIS — Z9049 Acquired absence of other specified parts of digestive tract: Secondary | ICD-10-CM | POA: Diagnosis not present

## 2021-09-01 DIAGNOSIS — R591 Generalized enlarged lymph nodes: Secondary | ICD-10-CM | POA: Diagnosis not present

## 2021-09-01 DIAGNOSIS — R000 Tachycardia, unspecified: Secondary | ICD-10-CM | POA: Diagnosis not present

## 2021-09-01 DIAGNOSIS — Z8249 Family history of ischemic heart disease and other diseases of the circulatory system: Secondary | ICD-10-CM | POA: Diagnosis not present

## 2021-09-01 DIAGNOSIS — R7401 Elevation of levels of liver transaminase levels: Secondary | ICD-10-CM | POA: Diagnosis not present

## 2021-09-01 DIAGNOSIS — R079 Chest pain, unspecified: Secondary | ICD-10-CM | POA: Diagnosis not present

## 2021-09-01 DIAGNOSIS — E039 Hypothyroidism, unspecified: Secondary | ICD-10-CM | POA: Diagnosis not present

## 2021-09-01 DIAGNOSIS — Z20822 Contact with and (suspected) exposure to covid-19: Secondary | ICD-10-CM | POA: Diagnosis not present

## 2021-09-01 DIAGNOSIS — I110 Hypertensive heart disease with heart failure: Secondary | ICD-10-CM | POA: Diagnosis not present

## 2021-09-01 DIAGNOSIS — Z8349 Family history of other endocrine, nutritional and metabolic diseases: Secondary | ICD-10-CM | POA: Diagnosis not present

## 2021-09-01 DIAGNOSIS — R059 Cough, unspecified: Secondary | ICD-10-CM | POA: Insufficient documentation

## 2021-09-01 DIAGNOSIS — J9859 Other diseases of mediastinum, not elsewhere classified: Secondary | ICD-10-CM | POA: Diagnosis not present

## 2021-09-01 DIAGNOSIS — Z888 Allergy status to other drugs, medicaments and biological substances status: Secondary | ICD-10-CM | POA: Diagnosis not present

## 2021-09-01 DIAGNOSIS — Z91040 Latex allergy status: Secondary | ICD-10-CM | POA: Diagnosis not present

## 2021-09-01 DIAGNOSIS — E010 Iodine-deficiency related diffuse (endemic) goiter: Secondary | ICD-10-CM | POA: Diagnosis not present

## 2021-09-01 DIAGNOSIS — Z79899 Other long term (current) drug therapy: Secondary | ICD-10-CM | POA: Diagnosis not present

## 2021-09-01 DIAGNOSIS — R112 Nausea with vomiting, unspecified: Secondary | ICD-10-CM | POA: Insufficient documentation

## 2021-09-01 DIAGNOSIS — I509 Heart failure, unspecified: Secondary | ICD-10-CM | POA: Diagnosis not present

## 2021-09-01 DIAGNOSIS — R590 Localized enlarged lymph nodes: Secondary | ICD-10-CM | POA: Diagnosis not present

## 2021-09-01 DIAGNOSIS — E049 Nontoxic goiter, unspecified: Secondary | ICD-10-CM | POA: Diagnosis not present

## 2021-09-01 DIAGNOSIS — I5083 High output heart failure: Secondary | ICD-10-CM | POA: Diagnosis not present

## 2021-09-01 DIAGNOSIS — D509 Iron deficiency anemia, unspecified: Secondary | ICD-10-CM | POA: Diagnosis not present

## 2021-09-01 DIAGNOSIS — Z23 Encounter for immunization: Secondary | ICD-10-CM | POA: Diagnosis not present

## 2021-09-01 DIAGNOSIS — R0600 Dyspnea, unspecified: Secondary | ICD-10-CM | POA: Diagnosis not present

## 2021-09-01 DIAGNOSIS — Z6841 Body Mass Index (BMI) 40.0 and over, adult: Secondary | ICD-10-CM | POA: Diagnosis not present

## 2021-09-01 DIAGNOSIS — E320 Persistent hyperplasia of thymus: Secondary | ICD-10-CM | POA: Diagnosis present

## 2021-09-01 DIAGNOSIS — Z5321 Procedure and treatment not carried out due to patient leaving prior to being seen by health care provider: Secondary | ICD-10-CM | POA: Insufficient documentation

## 2021-09-01 DIAGNOSIS — R0789 Other chest pain: Secondary | ICD-10-CM | POA: Diagnosis not present

## 2021-09-01 DIAGNOSIS — C801 Malignant (primary) neoplasm, unspecified: Secondary | ICD-10-CM | POA: Diagnosis not present

## 2021-09-01 DIAGNOSIS — F419 Anxiety disorder, unspecified: Secondary | ICD-10-CM | POA: Diagnosis present

## 2021-09-01 DIAGNOSIS — J811 Chronic pulmonary edema: Secondary | ICD-10-CM | POA: Diagnosis not present

## 2021-09-01 DIAGNOSIS — M7989 Other specified soft tissue disorders: Secondary | ICD-10-CM | POA: Diagnosis not present

## 2021-09-01 DIAGNOSIS — Z87891 Personal history of nicotine dependence: Secondary | ICD-10-CM | POA: Diagnosis not present

## 2021-09-01 DIAGNOSIS — J9811 Atelectasis: Secondary | ICD-10-CM | POA: Diagnosis not present

## 2021-09-01 DIAGNOSIS — E0590 Thyrotoxicosis, unspecified without thyrotoxic crisis or storm: Secondary | ICD-10-CM | POA: Diagnosis not present

## 2021-09-01 DIAGNOSIS — D649 Anemia, unspecified: Secondary | ICD-10-CM | POA: Diagnosis not present

## 2021-09-01 LAB — RESP PANEL BY RT-PCR (FLU A&B, COVID) ARPGX2
Influenza A by PCR: NEGATIVE
Influenza B by PCR: NEGATIVE
SARS Coronavirus 2 by RT PCR: NEGATIVE

## 2021-09-01 NOTE — ED Notes (Addendum)
Pt used call bell asking to go to bathroom, this RN goes to room and is meet with pt yelling and cussing at staff due to her having to wait. Attempted to explain to pt that every one was in other rooms but pt refusing to listen. Security called to bedside. ?

## 2021-09-01 NOTE — ED Triage Notes (Signed)
Pt with SOB, N/V, cough, fever and chills at home. Motrin last taken at 1500 today. Recent vacation to [employer].  ?

## 2021-09-02 ENCOUNTER — Observation Stay (HOSPITAL_COMMUNITY): Payer: BC Managed Care – PPO

## 2021-09-02 ENCOUNTER — Emergency Department (HOSPITAL_BASED_OUTPATIENT_CLINIC_OR_DEPARTMENT_OTHER): Payer: BC Managed Care – PPO | Admitting: Radiology

## 2021-09-02 ENCOUNTER — Emergency Department (HOSPITAL_BASED_OUTPATIENT_CLINIC_OR_DEPARTMENT_OTHER): Payer: BC Managed Care – PPO

## 2021-09-02 ENCOUNTER — Encounter (HOSPITAL_BASED_OUTPATIENT_CLINIC_OR_DEPARTMENT_OTHER): Payer: Self-pay | Admitting: Obstetrics and Gynecology

## 2021-09-02 ENCOUNTER — Inpatient Hospital Stay (HOSPITAL_BASED_OUTPATIENT_CLINIC_OR_DEPARTMENT_OTHER)
Admission: EM | Admit: 2021-09-02 | Discharge: 2021-09-06 | DRG: 291 | Disposition: A | Discharge status: Home / Self Care | Payer: BC Managed Care – PPO | Attending: Internal Medicine | Admitting: Internal Medicine

## 2021-09-02 DIAGNOSIS — J810 Acute pulmonary edema: Principal | ICD-10-CM

## 2021-09-02 DIAGNOSIS — I5031 Acute diastolic (congestive) heart failure: Secondary | ICD-10-CM | POA: Diagnosis not present

## 2021-09-02 DIAGNOSIS — Z79899 Other long term (current) drug therapy: Secondary | ICD-10-CM | POA: Diagnosis not present

## 2021-09-02 DIAGNOSIS — Z7982 Long term (current) use of aspirin: Secondary | ICD-10-CM

## 2021-09-02 DIAGNOSIS — Z20822 Contact with and (suspected) exposure to covid-19: Secondary | ICD-10-CM | POA: Diagnosis present

## 2021-09-02 DIAGNOSIS — Z87891 Personal history of nicotine dependence: Secondary | ICD-10-CM | POA: Diagnosis not present

## 2021-09-02 DIAGNOSIS — E049 Nontoxic goiter, unspecified: Secondary | ICD-10-CM

## 2021-09-02 DIAGNOSIS — D638 Anemia in other chronic diseases classified elsewhere: Secondary | ICD-10-CM | POA: Diagnosis not present

## 2021-09-02 DIAGNOSIS — I110 Hypertensive heart disease with heart failure: Principal | ICD-10-CM | POA: Diagnosis present

## 2021-09-02 DIAGNOSIS — Z6841 Body Mass Index (BMI) 40.0 and over, adult: Secondary | ICD-10-CM

## 2021-09-02 DIAGNOSIS — J9811 Atelectasis: Secondary | ICD-10-CM | POA: Diagnosis present

## 2021-09-02 DIAGNOSIS — R079 Chest pain, unspecified: Secondary | ICD-10-CM | POA: Diagnosis not present

## 2021-09-02 DIAGNOSIS — J811 Chronic pulmonary edema: Secondary | ICD-10-CM | POA: Diagnosis not present

## 2021-09-02 DIAGNOSIS — Z8249 Family history of ischemic heart disease and other diseases of the circulatory system: Secondary | ICD-10-CM | POA: Diagnosis not present

## 2021-09-02 DIAGNOSIS — E320 Persistent hyperplasia of thymus: Secondary | ICD-10-CM | POA: Diagnosis present

## 2021-09-02 DIAGNOSIS — Z888 Allergy status to other drugs, medicaments and biological substances status: Secondary | ICD-10-CM

## 2021-09-02 DIAGNOSIS — Z23 Encounter for immunization: Secondary | ICD-10-CM | POA: Diagnosis not present

## 2021-09-02 DIAGNOSIS — J9859 Other diseases of mediastinum, not elsewhere classified: Secondary | ICD-10-CM | POA: Diagnosis present

## 2021-09-02 DIAGNOSIS — I509 Heart failure, unspecified: Secondary | ICD-10-CM

## 2021-09-02 DIAGNOSIS — E0500 Thyrotoxicosis with diffuse goiter without thyrotoxic crisis or storm: Secondary | ICD-10-CM | POA: Diagnosis not present

## 2021-09-02 DIAGNOSIS — E0590 Thyrotoxicosis, unspecified without thyrotoxic crisis or storm: Secondary | ICD-10-CM | POA: Diagnosis not present

## 2021-09-02 DIAGNOSIS — D509 Iron deficiency anemia, unspecified: Secondary | ICD-10-CM | POA: Diagnosis not present

## 2021-09-02 DIAGNOSIS — Z8349 Family history of other endocrine, nutritional and metabolic diseases: Secondary | ICD-10-CM | POA: Diagnosis not present

## 2021-09-02 DIAGNOSIS — R7401 Elevation of levels of liver transaminase levels: Secondary | ICD-10-CM | POA: Diagnosis not present

## 2021-09-02 DIAGNOSIS — R590 Localized enlarged lymph nodes: Secondary | ICD-10-CM | POA: Diagnosis present

## 2021-09-02 DIAGNOSIS — I5083 High output heart failure: Secondary | ICD-10-CM | POA: Diagnosis present

## 2021-09-02 DIAGNOSIS — E039 Hypothyroidism, unspecified: Secondary | ICD-10-CM | POA: Diagnosis present

## 2021-09-02 DIAGNOSIS — F419 Anxiety disorder, unspecified: Secondary | ICD-10-CM | POA: Diagnosis present

## 2021-09-02 DIAGNOSIS — Z91040 Latex allergy status: Secondary | ICD-10-CM

## 2021-09-02 DIAGNOSIS — R0600 Dyspnea, unspecified: Secondary | ICD-10-CM | POA: Diagnosis not present

## 2021-09-02 DIAGNOSIS — R591 Generalized enlarged lymph nodes: Secondary | ICD-10-CM | POA: Diagnosis present

## 2021-09-02 DIAGNOSIS — R000 Tachycardia, unspecified: Secondary | ICD-10-CM

## 2021-09-02 DIAGNOSIS — D649 Anemia, unspecified: Secondary | ICD-10-CM | POA: Diagnosis present

## 2021-09-02 DIAGNOSIS — E010 Iodine-deficiency related diffuse (endemic) goiter: Secondary | ICD-10-CM | POA: Diagnosis not present

## 2021-09-02 DIAGNOSIS — M7989 Other specified soft tissue disorders: Secondary | ICD-10-CM | POA: Diagnosis not present

## 2021-09-02 LAB — CBC
HCT: 31.5 % — ABNORMAL LOW (ref 36.0–46.0)
Hemoglobin: 10 g/dL — ABNORMAL LOW (ref 12.0–15.0)
MCH: 25.8 pg — ABNORMAL LOW (ref 26.0–34.0)
MCHC: 31.7 g/dL (ref 30.0–36.0)
MCV: 81.2 fL (ref 80.0–100.0)
Platelets: 266 10*3/uL (ref 150–400)
RBC: 3.88 MIL/uL (ref 3.87–5.11)
RDW: 13.4 % (ref 11.5–15.5)
WBC: 7.1 10*3/uL (ref 4.0–10.5)
nRBC: 0 % (ref 0.0–0.2)

## 2021-09-02 LAB — BRAIN NATRIURETIC PEPTIDE: B Natriuretic Peptide: 467.4 pg/mL — ABNORMAL HIGH (ref 0.0–100.0)

## 2021-09-02 LAB — BASIC METABOLIC PANEL
Anion gap: 13 (ref 5–15)
BUN: 9 mg/dL (ref 6–20)
CO2: 22 mmol/L (ref 22–32)
Calcium: 10.3 mg/dL (ref 8.9–10.3)
Chloride: 104 mmol/L (ref 98–111)
Creatinine, Ser: <0.3 mg/dL — ABNORMAL LOW (ref 0.44–1.00)
Glucose, Bld: 83 mg/dL (ref 70–99)
Potassium: 3.6 mmol/L (ref 3.5–5.1)
Sodium: 139 mmol/L (ref 135–145)

## 2021-09-02 LAB — PREGNANCY, URINE: Preg Test, Ur: NEGATIVE

## 2021-09-02 LAB — TSH: TSH: 0.001 u[IU]/mL — ABNORMAL LOW (ref 0.350–4.500)

## 2021-09-02 LAB — TROPONIN I (HIGH SENSITIVITY)
Troponin I (High Sensitivity): 7 ng/L (ref <18)
Troponin I (High Sensitivity): 8 ng/L (ref <18)

## 2021-09-02 LAB — T4, FREE: Free T4: >5.5 ng/dL — ABNORMAL HIGH (ref 0.61–1.12)

## 2021-09-02 MED ORDER — LORAZEPAM 2 MG/ML IJ SOLN
1.0000 mg | Freq: Once | INTRAMUSCULAR | Status: AC
  Administered 2021-09-02: 1 mg via INTRAVENOUS
  Filled 2021-09-02: qty 1

## 2021-09-02 MED ORDER — ACETAMINOPHEN 325 MG PO TABS
650.0000 mg | ORAL_TABLET | Freq: Four times a day (QID) | ORAL | Status: DC | PRN
  Administered 2021-09-02: 650 mg via ORAL
  Filled 2021-09-02: qty 2

## 2021-09-02 MED ORDER — FUROSEMIDE 10 MG/ML IJ SOLN
40.0000 mg | Freq: Once | INTRAMUSCULAR | Status: AC
  Administered 2021-09-02: 40 mg via INTRAVENOUS
  Filled 2021-09-02: qty 4

## 2021-09-02 MED ORDER — IOHEXOL 350 MG/ML SOLN
100.0000 mL | Freq: Once | INTRAVENOUS | Status: AC | PRN
  Administered 2021-09-02: 200 mL via INTRAVENOUS

## 2021-09-02 MED ORDER — ALPRAZOLAM 0.25 MG PO TABS
0.2500 mg | ORAL_TABLET | Freq: Once | ORAL | Status: AC
  Administered 2021-09-02: 0.25 mg via ORAL
  Filled 2021-09-02: qty 1

## 2021-09-02 MED ORDER — HEPARIN SODIUM (PORCINE) 5000 UNIT/ML IJ SOLN: 5000.0000 [IU] | Freq: Three times a day (TID) | INTRAMUSCULAR | Status: DC

## 2021-09-02 NOTE — ED Provider Notes (Signed)
Patient care was taken over from Dr. Dennis Bast.  Patient presented with some chest pain and shortness of breath.  She was initially placed on oxygen by nasal cannula which she is currently on.  She has been persistently tachycardic in the ED.  She had a chest x-ray which showed some vascular congestion.  Her BNP is elevated.  Troponins are normal.  No ischemic changes on EKG.  Her CT of her chest showed no PE.  There was some mediastinal lymphadenopathy which is concerning for possible lymphoma.  Also some concerns for an enlarged thyroid.  Her thyroid test are still pending.  She remains tachycardic.  There is no significant increased work of breathing.  We will give her a dose of Lasix.  I feel that she would benefit from admission for further cardiac evaluation.  I spoke with Dr. Rogers Blocker who is excepted the patient to [facility] for [facility]. ?  Malvin Johns, MD ?09/02/21 1818 ? ?

## 2021-09-02 NOTE — Assessment & Plan Note (Addendum)
ACS less likely as high-sensitivity troponin negative x2. ?-Likely secondary to acute CHF exacerbation ?- CT angiogram chest negative for PE.  Currently chest pain-free and appears comfortable. ?-Cardiac monitoring ?-Cardiology following. ?

## 2021-09-02 NOTE — H&P (Signed)
?History and Physical  ? ? ?Pamela NottinghamJamie L Ungerer ZOX:[id number] DOB: 07-09-1985 DOA: 09/02/2021 ? ?[facility] ? ?[facility] ? ?Chief Complaint: Chest pain ? ?HPI: Pamela [name] is a 36 y.o. female with medical history significant of depression, anxiety, genital herpes, gonorrhea, class III obesity (BMI 43.26) presented to the ED with complaints of chest pain, shortness of breath, and left leg swelling.  In the ED, patient was persistently tachycardic. Placed on 2 L supplemental oxygen for comfort.  Labs showing no leukocytosis.  Hemoglobin 10.0, MCV 81.2.  High-sensitivity troponin negative x2.  BNP 467.  COVID and influenza PCR negative.  Left lower extremity Doppler negative for DVT.   ? ?CT angiogram chest negative for PE, pneumonia, or pulmonary edema.  Showing subsegmental atelectasis in the right lung.  Diffuse enlargement of the thyroid gland.  Mediastinal and bilateral hilar lymphadenopathy with a new soft tissue density in the anterior superior mediastinum.  ? ?Patient was given IV Lasix 40 mg and Ativan. ? ?Patient states her ankles have been swollen for the past several weeks.  She recently went to a beach in [place] with her family and since returning from her trip 3 days ago she has felt very short of breath and has experienced substernal chest pain.  Endorsing orthopnea and exertional dyspnea.  Endorsing subjective fevers.  No other family member is having similar symptoms.  Denies night sweats or weight loss.  Denies history of malignancy.  No other complaints. ? ?Review of Systems:  ?Review of Systems  ?All other systems reviewed and are negative. ? ?Past Medical History:  ?Diagnosis Date  ? Abnormal antenatal AFP screen   ? US and Cathlean SauerHarmony was low risk.  ? Anxiety   ? Genital herpes   ? Gonorrhea   ? History of abnormal Pap smear   ? History of postpartum depression, currently pregnant   ? HSV-2 infection complicating pregnancy   ? Will suppress at 34 weeks.  Don't discuss in front of FOB.  ? Mental disorder   ? hx pp depression now anxiety  ? Papanicolaou smear of cervix with positive high risk human papilloma virus (HPV) test 02/26/2021  ? 02/26/21 repeat pap in 1 year per [facility], 5 year risk for CIN3+ is 4.8%  ? Pregnant   ? Tobacco abuse 01/10/2012  ? ? ?Past Surgical History:  ?Procedure Laterality Date  ? CESAREAN SECTION  03/04/2011  ? Procedure: CESAREAN SECTION;  Surgeon: Lesly DukesKelly H. Leggett, MD;  Location: [facility] OR[facility];  Service: Gynecology;  Laterality: N/A;  primary of baby  boy at [id number]  ? CESAREAN SECTION N/A 10/27/2012  ? Procedure: CESAREAN SECTION;  Surgeon: Allie BossierMyra C Dove, MD;  Location: [facility] OR[facility];  Service: Obstetrics;  Laterality: N/A;  ? CHOLECYSTECTOMY  01/10/2012  ? Procedure: LAPAROSCOPIC CHOLECYSTECTOMY;  Surgeon: Dalia HeadingMark A Jenkins, MD;  Location: [facility];  Service: General;  Laterality: N/A;  attempted Intraopertive cholangiogram  ? TOE SURGERY Left   ? 02/2021  ? TONSILLECTOMY    ? ? ? reports that she has quit smoking. Her smoking use included cigarettes. She has a 1.25 pack-year smoking history. She has been exposed to tobacco smoke. She has never used smokeless tobacco. She reports current alcohol use. She reports current drug use. Drug: Marijuana. ? ?Allergies  ?Allergen Reactions  ? Ceftriaxone Hives, Shortness Of Breath and Itching  ?  Penicillins OK  ? Latex Itching and Other (See Comments)  ?  REACTION: discoloring of skin  ?  Tilactase   ? ? ?Family History  ?Problem Relation Age of Onset  ? Nephrolithiasis Father   ? Graves' disease Father   ? Thyroid disease Father   ? Hypertension Other   ? Cancer Other   ?     colon  ? ? ?Prior to Admission medications   ?Medication Sig Start Date End Date Taking? Authorizing Provider  ?doxycycline (VIBRA-TABS) 100 MG tablet Take 1 tablet (100 mg total) by mouth 2 (two) times daily. 07/13/21   Adline Potter, NP  ?fluconazole (DIFLUCAN) 150 MG tablet Take 1 now and 1 in 3 days 05/16/21   Adline Potter, NP  ?Lactic  Ac-Citric Ac-Pot Bitart (PHEXXI) 1.8-1-0.4 % GEL Use as directed before sex 02/16/21   Cyril Mourning A, NP  ?LYSINE PO Take 1 tablet by mouth daily.    [provider]  ?Multiple Vitamins-Minerals (HAIR SKIN AND NAILS FORMULA) TABS Take 1 tablet by mouth daily.    [provider]  ?mupirocin ointment (BACTROBAN) 2 % Apply 1 application topically 2 (two) times daily. 05/16/21   Adline Potter, NP  ?OREGANO PO Take 1 capsule by mouth daily.    [provider]  ?traZODone (DESYREL) 50 MG tablet Take 1 tablet (50 mg total) by mouth at bedtime. 02/16/21   Adline Potter, NP  ?valACYclovir (VALTREX) 1000 MG tablet Take 1 tablet (1,000 mg total) by mouth 2 (two) times daily. 01/12/21   Adline Potter, NP  ? ? ?Physical Exam: ?Vitals:  ? 09/02/21 1749 09/02/21 1800 09/02/21 1856 09/02/21 1936  ?BP:  137/73 (!) 155/75 140/88  ?Pulse:  (!) 116 (!) 119 (!) 117  ?Resp:  20 (!) 25 (!) 22  ?Temp:   98 ?F (36.7 ?C) 98.4 ?F (36.9 ?C)  ?TempSrc:    Oral  ?SpO2:  100% 100% 98%  ?Weight: (!) 139.3 kg   (!) 136.8 kg  ? ? ?Physical Exam ?Constitutional:   ?   General: She is not in acute distress. ?HENT:  ?   Head: Normocephalic and atraumatic.  ?Eyes:  ?   Extraocular Movements: Extraocular movements intact.  ?   Conjunctiva/sclera: Conjunctivae normal.  ?Cardiovascular:  ?   Rate and Rhythm: Regular rhythm. Tachycardia present.  ?   Pulses: Normal pulses.  ?Pulmonary:  ?   Effort: Pulmonary effort is normal. No respiratory distress.  ?   Breath sounds: No wheezing or rales.  ?Abdominal:  ?   General: Bowel sounds are normal. There is no distension.  ?   Palpations: Abdomen is soft.  ?   Tenderness: There is no abdominal tenderness.  ?Musculoskeletal:  ?   Cervical back: Normal range of motion and neck supple.  ?   Right lower leg: Edema present.  ?   Left lower leg: Edema present.  ?   Comments: Mild bilateral pedal edema ?Left lower extremity appears slightly larger in size compared to the  right lower extremity.  ?Skin: ?   General: Skin is warm and dry.  ?Neurological:  ?   General: No focal deficit present.  ?   Mental Status: She is alert and oriented to person, place, and time.  ?  ? ?Labs on Admission: I have personally reviewed following labs and imaging studies ? ?CBC: ?Recent Labs  ?Lab 09/02/21 ?1258  ?WBC 7.1  ?HGB 10.0*  ?HCT 31.5*  ?MCV 81.2  ?PLT 266  ? ?Basic Metabolic Panel: ?Recent Labs  ?Lab 09/02/21 ?1258  ?NA 139  ?K 3.6  ?  CL 104  ?CO2 22  ?GLUCOSE 83  ?BUN 9  ?CREATININE <0.30*  ?CALCIUM 10.3  ? ?GFR: ?CrCl cannot be calculated (This lab value cannot be used to calculate CrCl because it is not a number: <0.30). ?Liver Function Tests: ?No results for input(s): AST, ALT, ALKPHOS, BILITOT, PROT, ALBUMIN in the last 168 hours. ?No results for input(s): LIPASE, AMYLASE in the last 168 hours. ?No results for input(s): AMMONIA in the last 168 hours. ?Coagulation Profile: ?No results for input(s): INR, PROTIME in the last 168 hours. ?Cardiac Enzymes: ?No results for input(s): CKTOTAL, CKMB, CKMBINDEX, TROPONINI in the last 168 hours. ?BNP (last 3 results) ?No results for input(s): PROBNP in the last 8760 hours. ?HbA1C: ?No results for input(s): HGBA1C in the last 72 hours. ?CBG: ?No results for input(s): GLUCAP in the last 168 hours. ?Lipid Profile: ?No results for input(s): CHOL, HDL, LDLCALC, TRIG, CHOLHDL, LDLDIRECT in the last 72 hours. ?Thyroid Function Tests: ?Recent Labs  ?  09/02/21 ?1345  ?TSH 0.001*  ?FREET4 >5.50*  ? ?Anemia Panel: ?No results for input(s): VITAMINB12, FOLATE, FERRITIN, TIBC, IRON, RETICCTPCT in the last 72 hours. ?Urine analysis: ?   ?Component Value Date/Time  ? COLORURINE YELLOW 01/13/2020 2216  ? APPEARANCEUR CLEAR 01/13/2020 2216  ? LABSPEC 1.025 01/13/2020 2216  ? PHURINE 6.0 01/13/2020 2216  ? GLUCOSEU NEGATIVE 01/13/2020 2216  ? HGBUR NEGATIVE 01/13/2020 2216  ? BILIRUBINUR NEGATIVE 01/13/2020 2216  ? KETONESUR NEGATIVE 01/13/2020 2216  ? PROTEINUR  NEGATIVE 01/13/2020 2216  ? UROBILINOGEN 0.2 06/28/2014 0941  ? NITRITE NEGATIVE 01/13/2020 2216  ? LEUKOCYTESUR MODERATE (A) 01/13/2020 2216  ? ? ?Radiological Exams on Admission: I have personally reviewed

## 2021-09-02 NOTE — ED Notes (Signed)
Pt ambulated to the scale at triage area to get an accurate weight pre Lasix.  Pt continues to be sob, pt able to void in bathroom and placed back on monitoring and O2 at 2l by [facility].  ?

## 2021-09-02 NOTE — Assessment & Plan Note (Addendum)
Case discussed with endocrinology Dr. 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 ?-Patient seen in consultation by hematology/oncology who recommended evaluation of CT abdomen and pelvis which was negative for any further adenopathy and recommended evaluation by CT surgery. ?-Patient seen in consultation by CT surgery, Dr. Cliffton Asters who recommended outpatient follow-up. ?-Supportive care. ? ?  ?

## 2021-09-02 NOTE — ED Provider Notes (Addendum)
?[facility] EMERG[facility] DEPT ?Provider Note ? ? ?CSN: [id number] ?Arrival date & time: 09/02/21  1235 ? ?  ? ?History ? ?Chief Complaint  ?Patient presents with  ? Chest Pain  ? ? ?Pamela [name] is a 36 y.o. female. ? ? ?Chest Pain ?Associated symptoms: palpitations and shortness of breath   ? ?36 year old female with a history of HSV-2 infection, depression, anxiety, gonorrhea presenting to the emergency department with a chief complaint of chest pain.  The patient states that she has had substernal chest discomfort, intermittently described as pressure and sharp, pleuritic in nature for a few days.  She endorses palpitations and a sensation of anxiousness.  Endorses mild shortness of breath.  She states that she has had lower extremity swelling over the past few weeks and increasing orthopnea and dyspnea on exertion.  She denies any recent episodes of chest pain beyond the past few days.  She denies any history of MI.  She has never had a cardiac work-up.  She denies any fever or chills.  She denies any abdominal pain. ? ?Home Medications ?Prior to Admission medications   ?Medication Sig Start Date End Date Taking? Authorizing Provider  ?doxycycline (VIBRA-TABS) 100 MG tablet Take 1 tablet (100 mg total) by mouth 2 (two) times daily. 07/13/21   Adline Potter, NP  ?fluconazole (DIFLUCAN) 150 MG tablet Take 1 now and 1 in 3 days 05/16/21   Adline Potter, NP  ?Lactic Ac-Citric Ac-Pot Bitart (PHEXXI) 1.8-1-0.4 % GEL Use as directed before sex 02/16/21   Cyril Mourning A, NP  ?LYSINE PO Take 1 tablet by mouth daily.    [provider]  ?Multiple Vitamins-Minerals (HAIR SKIN AND NAILS FORMULA) TABS Take 1 tablet by mouth daily.    [provider]  ?mupirocin ointment (BACTROBAN) 2 % Apply 1 application topically 2 (two) times daily. 05/16/21   Adline Potter, NP  ?OREGANO PO Take 1 capsule by mouth daily.    [provider]  ?traZODone (DESYREL) 50 MG tablet Take 1  tablet (50 mg total) by mouth at bedtime. 02/16/21   Adline Potter, NP  ?valACYclovir (VALTREX) 1000 MG tablet Take 1 tablet (1,000 mg total) by mouth 2 (two) times daily. 01/12/21   Adline Potter, NP  ?   ? ?Allergies    ?Ceftriaxone, Latex, and Tilactase   ? ?Review of Systems   ?Review of Systems  ?Respiratory:  Positive for shortness of breath.   ?Cardiovascular:  Positive for chest pain, palpitations and leg swelling.  ?All other systems reviewed and are negative. ? ?Physical Exam ?Updated Vital Signs ?BP (!) 156/79 (BP Location: Left Arm)   Pulse (!) 114   Temp 98.6 ?F (37 ?C)   Resp 18   LMP 08/27/2021   SpO2 99%  ?Physical Exam ?Vitals and nursing note reviewed.  ?Constitutional:   ?   General: She is not in acute distress. ?   Appearance: She is well-developed. She is obese.  ?HENT:  ?   Head: Normocephalic and atraumatic.  ?Eyes:  ?   Conjunctiva/sclera: Conjunctivae normal.  ?Neck:  ?   Vascular: No JVD.  ?Cardiovascular:  ?   Rate and Rhythm: Regular rhythm. Tachycardia present.  ?   Heart sounds: Normal heart sounds. No murmur heard. ?Pulmonary:  ?   Effort: Pulmonary effort is normal. No respiratory distress.  ?   Breath sounds: Normal breath sounds.  ?Abdominal:  ?   Palpations: Abdomen is soft.  ?  Tenderness: There is no abdominal tenderness.  ?Musculoskeletal:     ?   General: No swelling.  ?   Cervical back: Neck supple.  ?   Right lower leg: Edema present.  ?   Left lower leg: Edema present.  ?   Comments: 2+ pitting left lower extremity edema, 1+ pitting edema in the right lower extremity  ?Skin: ?   General: Skin is warm and dry.  ?   Capillary Refill: Capillary refill takes less than 2 seconds.  ?Neurological:  ?   Mental Status: She is alert.  ?Psychiatric:     ?   Mood and Affect: Mood is anxious.  ? ? ?ED Results / Procedures / Treatments   ?Labs ?(all labs ordered are listed, but only abnormal results are displayed) ?Labs Reviewed  ?CBC - Abnormal; Notable for the following  components:  ?    Result Value  ? Hemoglobin 10.0 (*)   ? HCT 31.5 (*)   ? MCH 25.8 (*)   ? All other components within normal limits  ?BASIC METABOLIC PANEL  ?PREGNANCY, URINE  ?TROPONIN I (HIGH SENSITIVITY)  ? ? ?EKG ?None ? ?Radiology ?No results found. ? ?Procedures ?Procedures  ? ? ?Medications Ordered in ED ?Medications - No data to display ? ?ED Course/ Medical Decision Making/ A&P ?  ?                        ?Medical Decision Making ?Amount and/or Complexity of Data Reviewed ?Labs: ordered. ?Radiology: ordered. ? ?Risk ?Prescription drug management. ?Decision regarding hospitalization. ? ? ?36 year old female with a history of HSV-2 infection, depression, anxiety, gonorrhea presenting to the emergency department with a chief complaint of chest pain.  The patient states that she has had substernal chest discomfort, intermittently described as pressure and sharp, pleuritic in nature for a few days.  She endorses palpitations and a sensation of anxiousness.  Endorses mild shortness of breath.  She states that she has had lower extremity swelling over the past few weeks and increasing orthopnea and dyspnea on exertion.  She denies any recent episodes of chest pain beyond the past few days.  She denies any history of MI.  She has never had a cardiac work-up.  She denies any fever or chills.  She denies any abdominal pain. ? ?On arrival, the patient was tachycardic, afebrile, P114, hypertensive BP 136/79, saturating 99% on room air.  The patient was placed on 2 L O2 primarily for tachypnea. ?  ?Initial EKG  revealed sinus tachycardia, no ischemic changes.  Differential diagnosis includes thyrotoxicosis, new onset heart failure, ACS, PE, pneumonia, dehydration, DVT. ? ?Chest x-ray was performed reviewed by myself and radiology and revealed bibasilar low lung volumes.  Initial cardiac troponin was negative.  A BMP was collected and negative.  The CBC was without a leukocytosis,, anemia to 10.0.  The patient denies  any recent melena or hematochezia. ? ?Ultrasound DVT of the left lower extremity was negative for DVT. ? ?CT angiogram with the following findings: ?IMPRESSION:  ?1. No pulmonary embolism.  ?2. Subsegmental atelectasis in the right lung. The lungs are  ?otherwise clear.  ?3. New soft tissue density in the anterior superior mediastinum,  ?incompletely evaluated on this arterial phase exam. Also noted is  ?diffuse enlargement of the thyroid gland. Findings could represent  ?thymic hyperplasia in the setting of Graves' disease. Recommend  ?correlation with thyroid panel.  ?4. Mediastinal and bilateral hilar lymphadenopathy with new soft  ?  tissue density in the anterior superior mediastinum also raises  ?differential consideration of malignancy, including lymphoma.  ? ? ?Concern for possible thyrotoxicosis in the setting of hypertension, tachycardia, enlarged thyroid diffusely.  Thyroid studies still pending at time of signout.  Concern for possible new onset heart failure with a BNP of 467.  Concern for possible high-output heart failure in the setting of thyrotoxicosis.  Signout given to Dr. Fredderick PhenixBelfi at [phone] with a plan to likely admit the patient, follow-up on thyroid function studies. ? ? ? ?Final Clinical Impression(s) / ED Diagnoses ?Final diagnoses:  ?None  ? ? ?Rx / DC Orders ?ED Discharge Orders   ? ? None  ? ?  ? ? ?  ?Ernie AvenaLawsing, Kadince Boxley, MD ?09/02/21 2150 ? ?  ?Ernie AvenaLawsing, Nthony Lefferts, MD ?09/02/21 2151 ? ?

## 2021-09-02 NOTE — ED Triage Notes (Signed)
Patient reports to the ER for chest pain. Patient reports she feels like she is panicking. Patient endorses anxiety. Patient states she has pain right in the middle of her chest.  ?

## 2021-09-02 NOTE — Progress Notes (Signed)
Plan of Care Note for accepted transfer ? ? ?Patient: Pamela [name] MRN: [id number]   DOA: 09/02/2021 ? ?Facility requesting transfer: [facility] ?Requesting Provider: Dr. Fredderick Phenix ?Reason for transfer: CHF/? New thyroid disease and enlarged lymph nodes in mediastinum concerning for possible lymphoma/malignancy  ?Facility course: 36 year old with hx of depression and anxiety and genital herpes who presented to Ed with complaints of chest pain and shortness of breath. Also has had some leg swelling.  ? ?Vitals: stable, HR: 114, oxygen: 99%RA ?Pertinent labs; hgb: 10, bnp: 467,  ?CXR: pulmonary vascular congestion ?CTA chest: no PE, but new soft tissue density in anterior superior mediastinum. Diffuse thyroid enlargement and hilar lymphadenopathy with new soft tissue density raises concern for malignancy, lymphoma.  ?In ED given 40mg  of lasix.  ? ?Needs echo/thyroid ultrasound f/u for mediastinal lymphadenopathy.  ?Basic thyroid labs done in Ed.  ? ? ?Plan of care: ?The patient is accepted for admission to Telemetry unit, at [facility]..  ? ? ?Author: ?[facility], MD ?09/02/2021 ? ?Check [email or web address] for on-call coverage. ? ?Nursing staff, Please call [facility] Admits & Consults System-Wide number on Amion as soon as patient's arrival, so appropriate admitting provider can evaluate the pt. ?

## 2021-09-02 NOTE — Assessment & Plan Note (Addendum)
Thyroid ultrasound done with thyromegaly with heterogeneous hyperemic parenchyma, no nodule or other indication for biopsy or further imaging evaluation. ?-Free T3 level elevated at 17.5 ?-Thyrotropin receptor antibodies elevated at 19.40. ? ?Continue with methimazole and b blocker. ?Plan to follow up as outpatient with Dr Sharl Ma from endocrinology.  ?

## 2021-09-02 NOTE — ED Notes (Signed)
ED TO INPATIENT HANDOFF REPORT ? ?ED Nurse Name and Phone #: Albirda Shiel  ? ?S ?Name/Age/Gender ?Pamela [name] ?36 y.o. ?female ?Room/Bed: DB001/DB001 ? ?Code Status ?  Code Status: Prior ? ?Home/SNF/Other ?Home ?Patient oriented to: self, place, time, and situation ?Is this baseline? Yes  ? ?Triage Complete: Triage complete  ?Chief Complaint ?Acute exacerbation of CHF (congestive heart failure) (HCC) [I50.9] ? ?Triage Note ?Patient reports to the ER for chest pain. Patient reports she feels like she is panicking. Patient endorses anxiety. Patient states she has pain right in the middle of her chest.   ? ?Allergies ?Allergies  ?Allergen Reactions  ? Ceftriaxone Hives, Shortness Of Breath and Itching  ?  Penicillins OK  ? Latex Itching and Other (See Comments)  ?  REACTION: discoloring of skin  ? Tilactase   ? ? ?Level of Care/Admitting Diagnosis ?ED Disposition   ? ? ED Disposition  ?Admit  ? Condition  ?--  ? Comment  ?[facility] [[id number] ? Level of Care: Telemetry Medical [104] ? Interfacility transfer: Yes ? [facility] if equivalent level of care is available:: No ? Covid Evaluation: Asymptomatic - no recent exposure (last 10 days) testing not required ? Diagnosis: Acute exacerbation of CHF (congestive heart failure) (HCC) [409811][[id number]] ? Admitting Physician: Orland MustardWOLFE, ALLISON [[id number]] ? Attending Physician: Orland MustardWOLFE, ALLISON [[id number]] ?  ?  ? ?  ? ? ?B ?Medical/Surgery History ?Past Medical History:  ?Diagnosis Date  ? Abnormal antenatal AFP screen   ? US and [place]le[place]auer[place] was low risk.  ? Anxiety   ? Genital herpes   ? Gonorrhea   ? History of abnormal Pap smear   ? History of postpartum depression, currently pregnant   ? HSV-2 infection complicating pregnancy   ? Will suppress at 34 weeks. Don't discuss in front of FOB.  ? Mental disorder   ? hx pp depression now anxiety  ? Papanicolaou smear of cervix with positive high risk human papilloma virus  (HPV) test 02/26/2021  ? 02/26/21 repeat pap in 1 year per [facility], 5 year risk for CIN3+ is 4.8%  ? Pregnant   ? Tobacco abuse 01/10/2012  ? ?Past Surgical History:  ?Procedure Laterality Date  ? CESAREAN SECTION  03/04/2011  ? Procedure: CESAREAN SECTION;  Surgeon: Lesly DukesKelly H. Leggett, MD;  Location: [facility];  Service: Gynecology;  Laterality: N/A;  primary of baby  boy at [id number]  ? CESAREAN SECTION N/A 10/27/2012  ? Procedure: CESAREAN SECTION;  Surgeon: Allie BossierMyra C Dove, MD;  Location: [facility];  Service: Obstetrics;  Laterality: N/A;  ? CHOLECYSTECTOMY  01/10/2012  ? Procedure: LAPAROSCOPIC CHOLECYSTECTOMY;  Surgeon: Dalia HeadingMark A Jenkins, MD;  Location: [facility];  Service: General;  Laterality: N/A;  attempted Intraopertive cholangiogram  ? TOE SURGERY Left   ? 02/2021  ? TONSILLECTOMY    ?  ? ?A ?IV Location/Drains/Wounds ?Patient Lines/Drains/Airways Status   ? ? Active Line/Drains/Airways   ? ? Name Placement date Placement time Site Days  ? Peripheral IV 09/02/21 20 G 1" Right Antecubital 09/02/21  1303  Antecubital  less than 1  ? Incision 01/10/12 Abdomen Other (Comment) 01/10/12  1247  -- 3523  ? Incision 10/27/12 Abdomen Other (Comment) 10/27/12  1629  -- 3232  ? Incision - 4 Ports Abdomen 1: Umbilicus 2: Right;Lateral 3: Medial;Upper 4: Medial;Lower 01/10/12  1234  -- 3523  ? ?  ?  ? ?  ? ? ?Intake/Output  Last 24 hours ?No intake or output data in the 24 hours ending 09/02/21 1843 ? ?Labs/Imaging ?Results for orders placed or performed during the hospital encounter of 09/02/21 (from the past 48 hour(s))  ?Pregnancy, urine     Status: None  ? Collection Time: 09/02/21 12:42 PM  ?Result Value Ref Range  ? Preg Test, Ur NEGATIVE NEGATIVE  ?  Comment:        ?THE SENSITIVITY OF THIS ?METHODOLOGY IS >20 mIU/mL. ?Performed at [facility], [address] [place], [place] [address] ?  ?Basic metabolic panel     Status: Abnormal  ? Collection Time: 09/02/21 12:58 PM  ?Result Value Ref Range  ? Sodium 139 135 - 145  mmol/L  ? Potassium 3.6 3.5 - 5.1 mmol/L  ? Chloride 104 98 - 111 mmol/L  ? CO2 22 22 - 32 mmol/L  ? Glucose, Bld 83 70 - 99 mg/dL  ?  Comment: Glucose reference range applies only to samples taken after fasting for at least 8 hours.  ? BUN 9 6 - 20 mg/dL  ? Creatinine, Ser <0.30 (L) 0.44 - 1.00 mg/dL  ? Calcium 10.3 8.9 - 10.3 mg/dL  ? GFR, Estimated NOT CALCULATED >60 mL/min  ?  Comment: (NOTE) ?Calculated using the CKD-EPI Creatinine Equation (2021) ?  ? Anion gap 13 5 - 15  ?  Comment: Performed at [facility], [address], [place], [place] [address]  ?CBC     Status: Abnormal  ? Collection Time: 09/02/21 12:58 PM  ?Result Value Ref Range  ? WBC 7.1 4.0 - 10.5 K/uL  ? RBC 3.88 3.87 - 5.11 MIL/uL  ? Hemoglobin 10.0 (L) 12.0 - 15.0 g/dL  ? HCT 31.5 (L) 36.0 - 46.0 %  ? MCV 81.2 80.0 - 100.0 fL  ? MCH 25.8 (L) 26.0 - 34.0 pg  ? MCHC 31.7 30.0 - 36.0 g/dL  ? RDW 13.4 11.5 - 15.5 %  ? Platelets 266 150 - 400 K/uL  ? nRBC 0.0 0.0 - 0.2 %  ?  Comment: Performed at [facility], [address], [place], [place] [address]  ?Troponin I (High Sensitivity)     Status: None  ? Collection Time: 09/02/21 12:58 PM  ?Result Value Ref Range  ? Troponin I (High Sensitivity) 7 <18 ng/L  ?  Comment: (NOTE) ?Elevated high sensitivity troponin I (hsTnI) values and significant  ?changes across serial measurements may suggest ACS but many other  ?chronic and acute conditions are known to elevate hsTnI results.  ?Refer to the "Links" section for chest pain algorithms and additional  ?guidance. ?Performed at [facility], [address] ?[place], [place] [address] ?  ?Brain natriuretic peptide     Status: Abnormal  ? Collection Time: 09/02/21  1:29 PM  ?Result Value Ref Range  ? B Natriuretic Peptide 467.4 (H) 0.0 - 100.0 pg/mL  ?  Comment: Performed at [facility], [address], [place], [place] [address]  ?Troponin I (High Sensitivity)     Status:  None  ? Collection Time: 09/02/21  3:09 PM  ?Result Value Ref Range  ? Troponin I (High Sensitivity) 8 <18 ng/L  ?  Comment: (NOTE) ?Elevated high sensitivity troponin I (hsTnI) values and significant  ?changes across serial measurements may suggest ACS but many other  ?chronic and acute conditions are known to elevate hsTnI results.  ?Refer to the "Links" section for chest pain algorithms and additional  ?guidance. ?Performed at [facility], [address], ?[place], [place] [address] ?  ? ?DG Chest 2 View ? ?Result Date: 09/02/2021 ?CLINICAL DATA:  Chest pain and difficulty breathing. EXAM: CHEST - 2 VIEW COMPARISON:  11/17/2018 FINDINGS: Heart size and mediastinal contours are normal. Pulmonary vascular congestion is noted. No pleural effusion or frank interstitial edema. No superimposed airspace consolidation. Visualized osseous structures are unremarkable. IMPRESSION: Pulmonary vascular congestion. Electronically Signed   By: Signa Kell M.D.   On: 09/02/2021 13:21  ? ?CT Angio Chest PE W and/or Wo Contrast ? ?Result Date: 09/02/2021 ?CLINICAL DATA:  Chest pain. EXAM: CT ANGIOGRAPHY CHEST WITH CONTRAST TECHNIQUE: Multidetector CT imaging of the chest was performed using the standard protocol during bolus administration of intravenous contrast. Multiplanar CT image reconstructions and MIPs were obtained to evaluate the vascular anatomy. RADIATION DOSE REDUCTION: This exam was performed according to the departmental dose-optimization program which includes automated exposure control, adjustment of the mA and/or kV according to patient size and/or use of iterative reconstruction technique. CONTRAST:  OMNIPAQUE IOHEXOL 350 MG/ML SOLN COMPARISON:  CT angio chest 01/20/2012 FINDINGS: Cardiovascular: Satisfactory opacification of the pulmonary arteries to the segmental level. No evidence of pulmonary embolism. Normal heart size. No pericardial effusion. Mediastinum/Nodes: New soft tissue  density is noted within the anterior superior mediastinum between the sternum and the great vessels (series 4, image 28). There is some extension of the soft tissue into the left superior mediastinum to the leve

## 2021-09-02 NOTE — Assessment & Plan Note (Addendum)
Patient was admitted to the cardiac ward, she was placed on furosemide for diuresis, negative fluid balance was achieved, -7,599 ml,  with significant improvement in her symptoms.  ? ?Further work up with echocardiogram with EF 60 to 65%, with no wall motion abnormalities. RV with preserved systolic function, no significant valvular disease.  ? ?High output heart failure due to thyrotoxicosis.  ? ?Patient will continue labetalol and ARB, diuresis with furosemide. ?Plan to follow up as outpatient, possible transition to entresto.  ?

## 2021-09-02 NOTE — Assessment & Plan Note (Deleted)
CT showing mediastinal and bilateral hilar lymphadenopathy with a new soft tissue density in the anterior superior mediastinum.  Findings concerning for possible malignancy/lymphoma.  Patient is endorsing subjective fevers.  Denies night sweats or weight loss. ?-Check LDH level.  Consult oncology in the morning. ?

## 2021-09-02 NOTE — Assessment & Plan Note (Addendum)
Anemia of chronic disease with iron deficiency.  ?Her discharge hgb is 11,0 and hct 34,0 ?Iron panel with serum iron of 35, TIBC 248, transferrin saturation 14 and ferritin 286. ? ?Plan to continue treatment of hyperthyroidism and follow up iron panel as outpatient. If continue low transferrin saturation to consider starting patient on iron supplementation.  ?

## 2021-09-02 NOTE — ED Notes (Signed)
Patient transported to CT 

## 2021-09-03 ENCOUNTER — Observation Stay (HOSPITAL_COMMUNITY): Payer: BC Managed Care – PPO

## 2021-09-03 DIAGNOSIS — F419 Anxiety disorder, unspecified: Secondary | ICD-10-CM | POA: Diagnosis present

## 2021-09-03 DIAGNOSIS — R0600 Dyspnea, unspecified: Secondary | ICD-10-CM | POA: Diagnosis not present

## 2021-09-03 DIAGNOSIS — Z888 Allergy status to other drugs, medicaments and biological substances status: Secondary | ICD-10-CM | POA: Diagnosis not present

## 2021-09-03 DIAGNOSIS — E320 Persistent hyperplasia of thymus: Secondary | ICD-10-CM | POA: Diagnosis present

## 2021-09-03 DIAGNOSIS — I509 Heart failure, unspecified: Secondary | ICD-10-CM

## 2021-09-03 DIAGNOSIS — Z23 Encounter for immunization: Secondary | ICD-10-CM | POA: Diagnosis not present

## 2021-09-03 DIAGNOSIS — Z8349 Family history of other endocrine, nutritional and metabolic diseases: Secondary | ICD-10-CM | POA: Diagnosis not present

## 2021-09-03 DIAGNOSIS — Z6841 Body Mass Index (BMI) 40.0 and over, adult: Secondary | ICD-10-CM | POA: Diagnosis not present

## 2021-09-03 DIAGNOSIS — E049 Nontoxic goiter, unspecified: Secondary | ICD-10-CM | POA: Diagnosis not present

## 2021-09-03 DIAGNOSIS — D638 Anemia in other chronic diseases classified elsewhere: Secondary | ICD-10-CM | POA: Diagnosis present

## 2021-09-03 DIAGNOSIS — E0590 Thyrotoxicosis, unspecified without thyrotoxic crisis or storm: Secondary | ICD-10-CM | POA: Diagnosis not present

## 2021-09-03 DIAGNOSIS — I110 Hypertensive heart disease with heart failure: Secondary | ICD-10-CM | POA: Diagnosis present

## 2021-09-03 DIAGNOSIS — I5031 Acute diastolic (congestive) heart failure: Secondary | ICD-10-CM | POA: Diagnosis not present

## 2021-09-03 DIAGNOSIS — J810 Acute pulmonary edema: Secondary | ICD-10-CM | POA: Diagnosis not present

## 2021-09-03 DIAGNOSIS — Z20822 Contact with and (suspected) exposure to covid-19: Secondary | ICD-10-CM | POA: Diagnosis present

## 2021-09-03 DIAGNOSIS — E0500 Thyrotoxicosis with diffuse goiter without thyrotoxic crisis or storm: Secondary | ICD-10-CM | POA: Diagnosis not present

## 2021-09-03 DIAGNOSIS — R591 Generalized enlarged lymph nodes: Secondary | ICD-10-CM

## 2021-09-03 DIAGNOSIS — I5083 High output heart failure: Secondary | ICD-10-CM | POA: Diagnosis present

## 2021-09-03 DIAGNOSIS — D649 Anemia, unspecified: Secondary | ICD-10-CM

## 2021-09-03 DIAGNOSIS — J9859 Other diseases of mediastinum, not elsewhere classified: Secondary | ICD-10-CM | POA: Diagnosis not present

## 2021-09-03 DIAGNOSIS — R079 Chest pain, unspecified: Secondary | ICD-10-CM

## 2021-09-03 DIAGNOSIS — Z8249 Family history of ischemic heart disease and other diseases of the circulatory system: Secondary | ICD-10-CM | POA: Diagnosis not present

## 2021-09-03 DIAGNOSIS — D509 Iron deficiency anemia, unspecified: Secondary | ICD-10-CM | POA: Diagnosis present

## 2021-09-03 DIAGNOSIS — E039 Hypothyroidism, unspecified: Secondary | ICD-10-CM | POA: Diagnosis present

## 2021-09-03 DIAGNOSIS — R000 Tachycardia, unspecified: Secondary | ICD-10-CM

## 2021-09-03 DIAGNOSIS — Z79899 Other long term (current) drug therapy: Secondary | ICD-10-CM | POA: Diagnosis not present

## 2021-09-03 DIAGNOSIS — J9811 Atelectasis: Secondary | ICD-10-CM | POA: Diagnosis present

## 2021-09-03 DIAGNOSIS — R7401 Elevation of levels of liver transaminase levels: Secondary | ICD-10-CM | POA: Diagnosis not present

## 2021-09-03 DIAGNOSIS — Z87891 Personal history of nicotine dependence: Secondary | ICD-10-CM | POA: Diagnosis not present

## 2021-09-03 DIAGNOSIS — R590 Localized enlarged lymph nodes: Secondary | ICD-10-CM | POA: Diagnosis present

## 2021-09-03 DIAGNOSIS — Z91040 Latex allergy status: Secondary | ICD-10-CM | POA: Diagnosis not present

## 2021-09-03 LAB — CBC WITH DIFFERENTIAL/PLATELET
Abs Immature Granulocytes: 0.02 10*3/uL (ref 0.00–0.07)
Basophils Absolute: 0 10*3/uL (ref 0.0–0.1)
Basophils Relative: 0 %
Eosinophils Absolute: 0.3 10*3/uL (ref 0.0–0.5)
Eosinophils Relative: 5 %
HCT: 31.9 % — ABNORMAL LOW (ref 36.0–46.0)
Hemoglobin: 10.4 g/dL — ABNORMAL LOW (ref 12.0–15.0)
Immature Granulocytes: 0 %
Lymphocytes Relative: 36 %
Lymphs Abs: 1.9 10*3/uL (ref 0.7–4.0)
MCH: 26.1 pg (ref 26.0–34.0)
MCHC: 32.6 g/dL (ref 30.0–36.0)
MCV: 80.2 fL (ref 80.0–100.0)
Monocytes Absolute: 0.8 10*3/uL (ref 0.1–1.0)
Monocytes Relative: 14 %
Neutro Abs: 2.4 10*3/uL (ref 1.7–7.7)
Neutrophils Relative %: 45 %
Platelets: 273 10*3/uL (ref 150–400)
RBC: 3.98 MIL/uL (ref 3.87–5.11)
RDW: 13.4 % (ref 11.5–15.5)
WBC: 5.4 10*3/uL (ref 4.0–10.5)
nRBC: 0 % (ref 0.0–0.2)

## 2021-09-03 LAB — COMPREHENSIVE METABOLIC PANEL
ALT: 52 U/L — ABNORMAL HIGH (ref 0–44)
AST: 58 U/L — ABNORMAL HIGH (ref 15–41)
Albumin: 3.3 g/dL — ABNORMAL LOW (ref 3.5–5.0)
Alkaline Phosphatase: 62 U/L (ref 38–126)
Anion gap: 8 (ref 5–15)
BUN: 5 mg/dL — ABNORMAL LOW (ref 6–20)
CO2: 27 mmol/L (ref 22–32)
Calcium: 10 mg/dL (ref 8.9–10.3)
Chloride: 102 mmol/L (ref 98–111)
Creatinine, Ser: 0.38 mg/dL — ABNORMAL LOW (ref 0.44–1.00)
GFR, Estimated: >60 mL/min (ref >60)
Glucose, Bld: 120 mg/dL — ABNORMAL HIGH (ref 70–99)
Potassium: 3.7 mmol/L (ref 3.5–5.1)
Sodium: 137 mmol/L (ref 135–145)
Total Bilirubin: 0.7 mg/dL (ref 0.3–1.2)
Total Protein: 6.5 g/dL (ref 6.5–8.1)

## 2021-09-03 LAB — ECHOCARDIOGRAM COMPLETE
Area-P 1/2: 3.27 cm2
Calc EF: 49.6 %
Height: 70 in
S' Lateral: 3.6 cm
Single Plane A2C EF: 50.7 %
Single Plane A4C EF: 48.3 %
Weight: 4785.6 oz

## 2021-09-03 LAB — RETICULOCYTES
Immature Retic Fract: 23.8 % — ABNORMAL HIGH (ref 2.3–15.9)
RBC.: 3.81 MIL/uL — ABNORMAL LOW (ref 3.87–5.11)
Retic Count, Absolute: 80.8 10*3/uL (ref 19.0–186.0)
Retic Ct Pct: 2.1 % (ref 0.4–3.1)

## 2021-09-03 LAB — IRON AND TIBC
Iron: 35 ug/dL (ref 28–170)
Saturation Ratios: 14 % (ref 10.4–31.8)
TIBC: 248 ug/dL — ABNORMAL LOW (ref 250–450)
UIBC: 213 ug/dL

## 2021-09-03 LAB — HIV ANTIBODY (ROUTINE TESTING W REFLEX): HIV Screen 4th Generation wRfx: NONREACTIVE

## 2021-09-03 LAB — FERRITIN: Ferritin: 286 ng/mL (ref 11–307)

## 2021-09-03 LAB — MAGNESIUM: Magnesium: 1.5 mg/dL — ABNORMAL LOW (ref 1.7–2.4)

## 2021-09-03 LAB — HCG, QUANTITATIVE, PREGNANCY: hCG, Beta Chain, Quant, S: <1 m[IU]/mL (ref <5)

## 2021-09-03 LAB — VITAMIN B12: Vitamin B-12: 870 pg/mL (ref 180–914)

## 2021-09-03 LAB — LACTATE DEHYDROGENASE: LDH: 158 U/L (ref 98–192)

## 2021-09-03 LAB — BRAIN NATRIURETIC PEPTIDE: B Natriuretic Peptide: 318.2 pg/mL — ABNORMAL HIGH (ref 0.0–100.0)

## 2021-09-03 LAB — FOLATE: Folate: 19.6 ng/mL (ref >5.9)

## 2021-09-03 MED ORDER — MAGNESIUM SULFATE 4 GM/100ML IV SOLN
4.0000 g | Freq: Once | INTRAVENOUS | Status: AC
  Administered 2021-09-03: 4 g via INTRAVENOUS
  Filled 2021-09-03: qty 100

## 2021-09-03 MED ORDER — SODIUM CHLORIDE 0.9 % IV SOLN: INTRAVENOUS | Status: DC | PRN

## 2021-09-03 MED ORDER — PNEUMOCOCCAL 20-VAL CONJ VACC 0.5 ML IM SUSY
0.5000 mL | PREFILLED_SYRINGE | INTRAMUSCULAR | Status: AC
  Administered 2021-09-04: 0.5 mL via INTRAMUSCULAR
  Filled 2021-09-03: qty 0.5

## 2021-09-03 MED ORDER — ENOXAPARIN SODIUM 40 MG/0.4ML IJ SOSY
40.0000 mg | PREFILLED_SYRINGE | INTRAMUSCULAR | Status: DC
  Administered 2021-09-03 – 2021-09-05 (×3): 40 mg via SUBCUTANEOUS
  Filled 2021-09-03 (×3): qty 0.4

## 2021-09-03 MED ORDER — PROPRANOLOL HCL 10 MG PO TABS
10.0000 mg | ORAL_TABLET | Freq: Three times a day (TID) | ORAL | Status: DC
  Administered 2021-09-03 – 2021-09-04 (×4): 10 mg via ORAL
  Filled 2021-09-03 (×4): qty 1

## 2021-09-03 MED ORDER — METHIMAZOLE 5 MG PO TABS
5.0000 mg | ORAL_TABLET | Freq: Every day | ORAL | Status: DC
  Administered 2021-09-03: 5 mg via ORAL
  Filled 2021-09-03 (×2): qty 1

## 2021-09-03 MED ORDER — FUROSEMIDE 10 MG/ML IJ SOLN
40.0000 mg | Freq: Two times a day (BID) | INTRAMUSCULAR | Status: DC
  Administered 2021-09-03 – 2021-09-05 (×5): 40 mg via INTRAVENOUS
  Filled 2021-09-03 (×5): qty 4

## 2021-09-03 NOTE — Hospital Course (Addendum)
Pamela [name] was admitted to the hospital with the working diagnosis of decompensated heart failure, high output due to hyperthyroidism.   ? ?36 year old female with past medial history of depression, anxiety, genital herpes, gonorrhea, class III obesity presents to the ED with a 2-week history of shortness of breath, lower extremity edema, palpitations and orthopnea. On her initial physical examination her blood pressure was 137/73, HR 116, RR 25, 02 saturation 98%, heart with S1 and S2 present and tachycardic, lungs with no wheezing or rales, abdomen not distended and positive lower extremity edema. ? ?Na 139, K 3,6 Cl 104, bicarbonate 22, glucose 83, bun 9 cr <0,30 ?BNP 467 ?High sensitive troponin 7  ?Wbc 7.1 hgb 10,0, hct 31,5 plt 266  ?TSH 001 free T4 >5,5  ? ?Chest radiograph with bilateral hilar vascular congestion.  ? ?EKG 117 bpm, normal axis, normal intervals, sinus rhythm with no significant ST segment or T wave changes.  ? ?CT chest with no pulmonary embolism, faint bilateral grouns glass opacities, new soft tissue density in the anterior mediastinum. Diffuse enlargement of the thyroid gland. Mediastinal and bilateral hilar lymphadenopathy.  ? ?Patient was placed on furosemide for diuresis.  ?Echocardiogram with preserved LV systolic function  ?Positive thyrotropin receptor antibodies. ? ?Patient has been placed on methimazole and propranolol. Plan to follow up as outpatient with endocrinology.  ? ?

## 2021-09-03 NOTE — Progress Notes (Signed)
Echocardiogram ?2D Echocardiogram has been performed. ? ?Warren Lacy Tisheena Maguire RDCS ?09/03/2021, 10:35 AM ?

## 2021-09-03 NOTE — Progress Notes (Signed)
?   09/03/21 1154  ?Assess: MEWS Score  ?Temp 98.3 ?F (36.8 ?C)  ?BP 139/78  ?Pulse Rate (!) 103  ?ECG Heart Rate (!) 102  ?Resp (!) 22  ?Level of Consciousness Alert  ?SpO2 97 %  ?O2 Device Room Air  ?Assess: MEWS Score  ?MEWS Temp 0  ?MEWS Systolic 0  ?MEWS Pulse 1  ?MEWS RR 1  ?MEWS LOC 0  ?MEWS Score 2  ?MEWS Score Color Yellow  ?Assess: if the MEWS score is Yellow or Red  ?Were vital signs taken at a resting state? Yes  ?Focused Assessment No change from prior assessment  ?Early Detection of Sepsis Score *See Row Information* Low  ?MEWS guidelines implemented *See Row Information* Yes  ?Treat  ?MEWS Interventions Administered scheduled meds/treatments  ?Pain Scale 0-10  ?Pain Score 0  ?Pain Type Acute pain  ?Take Vital Signs  ?Increase Vital Sign Frequency  Yellow: Q 2hr X 2 then Q 4hr X 2, if remains yellow, continue Q 4hrs  ?Notify: Charge Nurse/RN  ?Name of Charge Nurse/RN Notified Laurell Roof  ?Notify: Provider  ?Provider Name/Title Janee Morn  ?Date Provider Notified 09/03/21  ?Time Provider Notified 1212  ?Notification Type Page  ?Notification Reason Requested by patient/family  ?Provider response No new orders  ?Document  ?Patient Outcome Not stable and remains on department  ?Progress note created (see row info) Yes  ? ?Patient is at Baseline for admission scheduled med's given patient states that is has anxiety in nature. And would like xanax and a shower that will help her relax. ?

## 2021-09-03 NOTE — Progress Notes (Signed)
Heart Failure Nurse Navigator Progress Note ? ?Agreeable to HV TOC clinic appt. Possible DC tomorrow. Pt concerned about mass biopsy.  ?New dx dHF, EF 60-65%-- ? ?Heart Failure Nurse Navigator Progress Note ? ?PCP: [facility] ?PCP-Cardiologist: none (NEW) ?Admission Diagnosis: new dHF ?Admitted from: home with 2 minor children ? ?Presentation:   ?Pamela [name] presented 4/3 with increased SOB and BLE edema. Patient interactive with interview process. Pt ambulating in room, getting ready to walk halls with mobility team. Pt lives at home with her two young children, mother lives next door and is able to help with her kids while she is at work. Pt works at a group home. Endorses dietary indiscretion---her and children eat fast food almost everyday as this is cheaper than getting groceries. Educated on dietary modifications focusing on sodium restrictions and limiting fluid intake. Pt drinks 4-6 32oz cups of water daily.  ?Pt drives reliable vehicle. She is eager to "do well". Explained importance of medication compliance. No concern regarding medication costs. ?Explained benefits of Heart & Vascular Transitions of Care Clinic appointment, patient agreeable. Lives in [place], [place] but agreeable to 1-2 appts in [place], then prefers cardiology care in [place]. ?Pt states some concern regarding need for biopsy, questioning if she will get it completed while in the hospital.  ? ?ECHO/ LVEF: 60-65%, mild LVH ? ?Clinical Course: ? ?Past Medical History:  ?Diagnosis Date  ? Abnormal antenatal AFP screen   ? [place] and Cathlean Sauer was low risk.  ? Anxiety   ? Genital herpes   ? Gonorrhea   ? History of abnormal Pap smear   ? History of postpartum depression, currently pregnant   ? HSV-2 infection complicating pregnancy   ? Will suppress at 34 weeks. Don't discuss in front of FOB.  ? Mental disorder   ? hx pp depression now anxiety  ? Papanicolaou smear of cervix with positive high risk human papilloma virus (HPV) test  02/26/2021  ? 02/26/21 repeat pap in 1 year per [facility], 5 year risk for CIN3+ is 4.8%  ? Pregnant   ? Tobacco abuse 01/10/2012  ?  ? ?Social History  ? ?Socioeconomic History  ? Marital status: Single  ?  Spouse name: Not on file  ? Number of children: 2  ? Years of education: Not on file  ? Highest education level: Not on file  ?Occupational History  ? Not on file  ?Tobacco Use  ? Smoking status: Former  ?  Packs/day: 0.25  ?  Years: 5.00  ?  Pack years: 1.25  ?  Types: Cigarettes  ?  Passive exposure: Past  ? Smokeless tobacco: Never  ? Tobacco comments:  ?  Quit smoking x6 months ago  ?Vaping Use  ? Vaping Use: Never used  ?Substance and Sexual Activity  ? Alcohol use: Yes  ?  Comment: occ  ? Drug use: Yes  ?  Types: Marijuana  ?  Comment: daily  ? Sexual activity: Yes  ?  Birth control/protection: Condom  ?Other Topics Concern  ? Not on file  ?Social History Narrative  ? Not on file  ? ?Social Determinants of Health  ? ?Financial Resource Strain: Medium Risk  ? Difficulty of Paying Living Expenses: Somewhat hard  ?Food Insecurity: Food Insecurity Present  ? Worried About Programme researcher, broadcasting/film/video in the Last Year: Sometimes true  ? Ran Out of Food in the Last Year: Never true  ?Transportation Needs: No Transportation Needs  ? Lack of Transportation (  Medical): No  ? Lack of Transportation (Non-Medical): No  ?Physical Activity: Insufficiently Active  ? Days of Exercise per Week: 2 days  ? Minutes of Exercise per Session: 10 min  ?Stress: Stress Concern Present  ? Feeling of Stress : Very much  ?Social Connections: Socially Integrated  ? Frequency of Communication with Friends and Family: More than three times a week  ? Frequency of Social Gatherings with Friends and Family: Twice a week  ? Attends Religious Services: More than 4 times per year  ? Active Member of Clubs or Organizations: Yes  ? Attends Banker Meetings: More than 4 times per year  ? Marital Status: Living with partner  ? ? ?High Risk Criteria for  Readmission and/or Poor Patient Outcomes: ?Heart failure hospital admissions (last 6 months): 1  ?No Show rate: NA ?Difficult social situation: yes, single mom limited budget causing poor dietary choices ?Demonstrates medication adherence: yes ?Primary Language: English ?Literacy level: Able to read/write and comprehend. ? ?Education Assessment and Provision: ? ?Detailed education and instructions provided on heart failure disease management including the following: ? ?Signs and symptoms of Heart Failure ?When to call the physician ?Importance of daily weights ?Low sodium diet ?Fluid restriction ?Medication management ?Anticipated future follow-up appointments ? ?Patient education given on each of the above topics.  Patient acknowledges understanding via teach back method and acceptance of all instructions. ? ?Education Materials:  "Living Better With Heart Failure" Booklet, HF zone tool, & Daily Weight Tracker Tool. ? ?Patient has scale at home: yes ?Patient has pill box at home: no, given from AHF clinic ?   ?Barriers of Care:   ?-dietary indiscretions ?-cardiology follow up ? ?Considerations/Referrals:  ? ?Referral made to Heart Failure Pharmacist Stewardship: no ?Referral made to Heart Failure CSW/NCM TOC: no ?Referral made to Heart & Vascular TOC clinic: yes, 4/11  @ noon ? ?Items for Follow-up on DC/TOC: ?-optimize ?-cont HF education ?-sodium modification ?-fluid modification ? ? ?Ozella Rocks, MSN, RN ?Heart Failure Nurse Navigator ?[phone] ? ? ?

## 2021-09-03 NOTE — Progress Notes (Addendum)
?PROGRESS NOTE ? ? ? ?Pamela [name]  WCH:[id number] DOB: 12/01/1985 DOA: 09/02/2021 ?PCP: [facility]  ? ? ?Chief Complaint  ?Patient presents with  ? Chest Pain  ? ? ?Brief Narrative:  ?Patient pleasant 36 year old female history of depression, anxiety, genital herpes, gonorrhea, class III obesity presents to the ED with a 2-week history of shortness of breath, lower extremity edema, palpitations.  Patient noted to be new onset CHF exacerbation.  BNP elevated.  COVID-19 influenza PCR negative.  Lower extremity Dopplers that were negative.  CT angiogram chest negative for PE with some subsegmental atelectasis in the right lung.  Mediastinal bilateral hilar lymphadenopathy noted with new soft tissue density in the anterior mediastinum.  Patient also noted to be hyperthyroid.  Patient placed on IV Lasix.  Cardiology consulted.  ? ? ?Assessment & Plan: ? Principal Problem: ?  Dyspnea ?Active Problems: ?  Acute diastolic CHF (congestive heart failure) (HCC) ?  Mediastinal mass ?  Thyroid enlargement ?  Hyperthyroidism ?  Chest pain ?  Normocytic anemia ?  Acute exacerbation of CHF (congestive heart failure) (HCC) ?  Acute pulmonary edema (HCC) ?  Tachycardia ?  Hypomagnesemia ? ? ? ?Assessment and Plan: ?* Dyspnea ?Secondary to new onset acute diastolic CHF . ?-Patient with elevated BNP ?Patient presented with complaints of bilateral ankle swelling, dyspnea on exertion, and orthopnea.  BNP elevated concerning for possible new onset CHF.  ?- CT without evidence of pulmonary edema. ?-Cardiac monitoring. ?-2D echo with a EF of 60 to 65%,NWMA, mild LVH. ?-Patient was given IV Lasix 40 mg in the ED with a urine output of 1.350 L. ?-Place on Lasix 40 mg IV every 12 hours. ?-Cardiology consulted. ?-Echocardiogram ? ?Acute diastolic CHF (congestive heart failure) (HCC) ?- Patient presenting with new onset acute diastolic CHF. ?-Patient presented with lower extremity edema, orthopnea, dyspnea on exertion, PND,  noted to have elevated BNP. ?-Chest x-ray done concerning for pulmonary vascular congestion. ?-CT angiogram chest done negative for PE. ?-Patient noted to be tachycardic on admission and work-up also concerning for hyperthyroidism. ?-2D echo done with a EF of 60 to 65%,NWMA, mild LVH. ?-Troponin negative. ?-EKG with a sinus tachycardia. ?-Patient received Lasix 40 mg IV x1 in the ED with urine output of 1.350 L. ?-Patient with clinical improvement. ?-Placed on Lasix 40 mg IV every 12 hours. ?-Strict I's and O's, daily weights. ?-Place on propranolol secondary to hyperthyroidism. ?-Due to new onset CHF we will consult with cardiology for further evaluation and management. ? ?Hyperthyroidism ?- TSH of 0.001.,  Free T4 >5.50.  T3 pending. ?-Thyrotropin receptor antibodies pending. ?-Thyroid ultrasound with thyromegaly with heterogeneous hyperemic parenchyma, no nodule or other indication for biopsy or further imaging evaluation. ?-CT chest with mediastinal and bilateral hilar adenopathy with new soft tissue density in the anterior superior mediastinum with differential concerning for thymic mass versus lymphoma. ?-Place on propanolol 10 mg p.o. 3 times daily. ?-Start methimazole 5 mg daily. ?-We will need outpatient close follow-up with endocrinology. ? ? ?Thyroid enlargement ?CT showing diffuse enlargement of the thyroid gland.  TSH very low at 0.001 and free T4 elevated >5.50 concerning for hyperthyroidism..  Differentials include Graves' disease versus toxic multinodular goiter. ?-Patient does endorse a family history of Graves' disease in her father. ?-Thyroid ultrasound done with thyromegaly with heterogeneous hyperemic parenchyma, no nodule or other indication for biopsy or further imaging evaluation. ?-Free T3 level pending. ?-Thyrotropin receptor antibodies pending. ?-Place on propranolol 10 mg 3 times daily, methimazole 5  mg daily. ?-We will need outpatient follow-up with endocrinology. ? ?Mediastinal  mass ?Patient is endorsing subjective fevers.   ?-Denied night sweats or weight loss. CT showing mediastinal and bilateral hilar lymphadenopathy with a new soft tissue density in the anterior superior mediastinum.    Differentials include thymic mass versus lymphoma.   ?-Antiacetylcholine receptor antibodies pending. ?-LDH within normal limits ?-AFP ?-Beta-hCG < 1. ?-Will need tissue biopsy for further evaluation.   ?-Consult oncology for further evaluation and management. ?-We will also consult with CT surgery for further evaluation and management. ? ?Normocytic anemia ?Hemoglobin 10.0, was 10.4 on 07/21/2021 (Care Everywhere).  Not endorsing any symptoms of active bleeding. ?-Likely iron deficiency anemia in menstruating female. ?-Anemia panel with iron level of 35, TIBC of 248, ferritin of 286, folate of 19.6. ?-Follow H&H. ?-Transfusion threshold hemoglobin < 8.  ? ?Chest pain ?ACS less likely as high-sensitivity troponin negative x2. ?-Likely secondary to acute CHF exacerbation ?- CT angiogram chest negative for PE.  Currently chest pain-free and appears comfortable. ?-Cardiac monitoring ? ?Hypomagnesemia ?- Magnesium level at 1.5. ?-Magnesium sulfate 4 g IV x1. ?-Repeat labs in the morning. ? ? ? ? ?  ? ? ?DVT prophylaxis: Lovenox ?Code Status: Full ?Family Communication: Updated patient and sister at bedside. ?Disposition: Home when clinically improved. ? ?Status is: Inpatient ?The patient will require care spanning > 2 midnights and should be moved to inpatient because: Severity of illness. ?  ?Consultants:  ?Cardiology ? ?Procedures:  ?2D echo 09/03/2021 ?Thyroid ultrasound 09/02/2021 ?CT angiogram chest 09/02/2021 ? ? ? ?Antimicrobials:  ?None ? ? ?Subjective: ?Sitting up in bed.  Sister at bedside.  Patient states improvement with shortness of breath and orthopnea after receiving IV Lasix.  Does endorse some palpitations.  Denies any ongoing chest pain.  States improvement with lower extremity edema over the  past 2 weeks after receiving diuretics. ? ?Objective: ?Vitals:  ? 09/03/21 0518 09/03/21 0732 09/03/21 1154 09/03/21 1518  ?BP: (!) 131/52 (!) 144/76 139/78 138/62  ?Pulse: (!) 117 (!) 107 (!) 103 (!) 106  ?Resp: 20 (!) 22 (!) 22 19  ?Temp: 98.3 ?F (36.8 ?C) 98.3 ?F (36.8 ?C) 98.3 ?F (36.8 ?C) (!) 97.5 ?F (36.4 ?C)  ?TempSrc: Oral Oral Oral Oral  ?SpO2: 99% 96% 97% 96%  ?Weight: 135.7 kg     ?Height:      ? ? ?Intake/Output Summary (Last 24 hours) at 09/03/2021 1643 ?Last data filed at 09/03/2021 1344 ?Gross per 24 hour  ?Intake 840 ml  ?Output 4950 ml  ?Net -4110 ml  ? ?Filed Weights  ? 09/02/21 1749 09/02/21 1936 09/03/21 0518  ?Weight: (!) 139.3 kg (!) 136.8 kg 135.7 kg  ? ? ?Examination: ? ?General exam: Appears calm and comfortable  ?Respiratory system: Decreased breath sounds in the bases, some scattered crackles noted.  No wheezing.  Fair air movement.  Speaking in full sentences.  ?Cardiovascular system: Tachycardia.  No JVD, no murmurs rubs or gallops.  Trace to 1+ bilateral lower extremity edema. ?Gastrointestinal system: Abdomen is nondistended, soft and nontender. No organomegaly or masses felt. Normal bowel sounds heard. ?Central nervous system: Alert and oriented. No focal neurological deficits. ?Extremities: Symmetric 5 x 5 power. ?Skin: No rashes, lesions or ulcers ?Psychiatry: Judgement and insight appear normal. Mood & affect appropriate.  ? ? ? ?Data Reviewed:  ? ?CBC: ?Recent Labs  ?Lab 09/02/21 ?1258 09/03/21 ?[id number]  ?WBC 7.1 5.4  ?NEUTROABS  --  2.4  ?HGB 10.0* 10.4*  ?HCT 31.5* 31.9*  ?  MCV 81.2 80.2  ?PLT 266 273  ? ? ?Basic Metabolic Panel: ?Recent Labs  ?Lab 09/02/21 ?1258 09/03/21 ?6314  ?NA 139 137  ?K 3.6 3.7  ?CL 104 102  ?CO2 22 27  ?GLUCOSE 83 120*  ?BUN 9 5*  ?CREATININE <0.30* 0.38*  ?CALCIUM 10.3 10.0  ?MG  --  1.5*  ? ? ?GFR: ?Estimated Creatinine Clearance: 150.6 mL/min (A) (by C-G formula based on SCr of 0.38 mg/dL (L)). ? ?Liver Function Tests: ?Recent Labs  ?Lab 09/03/21 ?9702  ?AST  58*  ?ALT 52*  ?ALKPHOS 62  ?BILITOT 0.7  ?PROT 6.5  ?ALBUMIN 3.3*  ? ? ?CBG: ?No results for input(s): GLUCAP in the last 168 hours. ? ? ?Recent Results (from the past 240 hour(s))  ?Resp Panel by RT-PCR (Flu

## 2021-09-03 NOTE — Progress Notes (Signed)
Mobility Specialist Progress Note: ? ? 09/03/21 1645  ?Mobility  ?Activity Ambulated independently in hallway  ?Level of Assistance Independent  ?Assistive Device None  ?Distance Ambulated (ft) 1500 ft  ?Activity Response Tolerated well  ?$Mobility charge 1 Mobility  ? ?Pt eager for second mobility session. VSS on RA throughout. Pt back sitting EOB with all needs met. ? ?Pamela [name] ?Acute Rehab ?Phone: [phone] ?Office Phone: [phone] ? ?

## 2021-09-03 NOTE — Assessment & Plan Note (Addendum)
-   TSH of 0.001.,  Free T4 >5.50.  T3 elevated at 17.5.Marland Kitchen ?-Thyrotropin receptor antibodies elevated at 19.40 ?-Thyroid ultrasound with thyromegaly with heterogeneous hyperemic parenchyma, no nodule or other indication for biopsy or further imaging evaluation. ?-CT chest with mediastinal and bilateral hilar adenopathy with new soft tissue density in the anterior superior mediastinum with differential concerning for thymic mass versus lymphoma. ?-Increase propranolol to 20 mg 3 times daily.   ?-Increase methimazole to 10 mg daily.   ?-Case discussed with endocrinology, Dr. Buddy Duty who feels likely secondary to [facility]' disease as patient also with family history.   ?-Outpatient follow-up with Dr. Buddy Duty, endocrinology.   ?-Please see thyroid enlargement above.  ? ?

## 2021-09-03 NOTE — Consult Note (Signed)
[facility]  ?[phone] [phone]  ? ?MEDICAL ONCOLOGY - INITIAL CONSULTATION ? ?Referral MD: Dr. Ramiro Harvest ? ?Reason for Referral: Anterior mediastinal mass with mediastinal and bilateral hilar lymphadenopathy ? ?HPI: Pamela [name] is a 36 year old female with a past medical history significant for depression, anxiety, genital herpes, history of gonorrhea, obesity.  She presented to the emergency department with chest pain, shortness of breath, lower extremity edema.  Admission lab work showed a hemoglobin of 10.0, BNP 467.4, TSH 0.001, free T4 >5.50.  Her LDH was normal at 158.  She had a CTA chest which was negative for PE but did show a soft tissue density in the anterior superior mediastinum, diffuse enlargement of the thyroid gland, mediastinal and bilateral hilar lymphadenopathy with new soft tissue density in the anterior superior mediastinum which raises the concern for malignancy including lymphoma.  Thyroid ultrasound showed thyromegaly with heterogeneous hyperemic parenchyma and there was no nodule or other indication for biopsy.  Additional lab work included an AFP which was <1.8 and quantitative hCG which was <1.  Cardiology following for acute exacerbation of diastolic heart failure which is a new diagnosis for her.  They think this may be related to her hyperthyroidism. ? ?Today, the patient reports she is feeling better.  Her lower extremity edema has improved.  She reports that she is having good appetite and has not lost any weight recently.  She had a fever 1 time earlier this week but no chills or night sweats.  She is not having any headaches or dizziness today.  Her chest pain and shortness of breath have resolved.  Reports intermittent nonproductive cough.  Denies abdominal pain, nausea, vomiting, constipation, diarrhea.  Denies bleeding.  The patient is single but has a boyfriend.  She has 2 children.  Denies alcohol use.  She smoked about 3  cigarettes/day for 10 years but quit 6 months ago.  Family history significant for a father with Graves' disease and colon cancer.  Medical oncology was asked see the patient to make recommendations regarding her anterior mediastinal mass and lymphadenopathy. ? ? ?Past Medical History:  ?Diagnosis Date  ? Abnormal antenatal AFP screen   ? [place] and Cathlean Sauer was low risk.  ? Anxiety   ? Genital herpes   ? Gonorrhea   ? History of abnormal Pap smear   ? History of postpartum depression, currently pregnant   ? HSV-2 infection complicating pregnancy   ? Will suppress at 34 weeks. Don't discuss in front of FOB.  ? Mental disorder   ? hx pp depression now anxiety  ? Papanicolaou smear of cervix with positive high risk human papilloma virus (HPV) test 02/26/2021  ? 02/26/21 repeat pap in 1 year per [facility], 5 year risk for CIN3+ is 4.8%  ? Pregnant   ? Tobacco abuse 01/10/2012  ?: ? ? ?Past Surgical History:  ?Procedure Laterality Date  ? CESAREAN SECTION  03/04/2011  ? Procedure: CESAREAN SECTION;  Surgeon: Lesly Dukes, MD;  Location: [facility];  Service: Gynecology;  Laterality: N/A;  primary of baby  boy at 40  ? CESAREAN SECTION N/A 10/27/2012  ? Procedure: CESAREAN SECTION;  Surgeon: Allie Bossier, MD;  Location: [facility];  Service: Obstetrics;  Laterality: N/A;  ? CHOLECYSTECTOMY  01/10/2012  ? Procedure: LAPAROSCOPIC CHOLECYSTECTOMY;  Surgeon: Dalia Heading, MD;  Location: [facility];  Service: General;  Laterality: N/A;  attempted Intraopertive cholangiogram  ? TOE SURGERY Left   ? 02/2021  ?  TONSILLECTOMY    ?: ? ? ?Current Facility-Administered Medications  ?Medication Dose Route Frequency Provider Last Rate Last Admin  ? 0.9 %  sodium chloride infusion   Intravenous PRN Rodolph Bonghompson, Daniel V, MD 10 mL/hr at 09/03/21 1510 New Bag at 09/03/21 1510  ? acetaminophen (TYLENOL) tablet 650 mg  650 mg Oral Q6H PRN John Giovanniathore, Vasundhra, MD   650 mg at 09/02/21 2302  ? enoxaparin (LOVENOX) injection 40 mg  40 mg Subcutaneous Q24H Rodolph Bonghompson,  Daniel V, MD      ? furosemide (LASIX) injection 40 mg  40 mg Intravenous Q12H Rodolph Bonghompson, Daniel V, MD   40 mg at 09/03/21 [id number]  ? magnesium sulfate IVPB 4 g 100 mL  4 g Intravenous Once Rodolph Bonghompson, Daniel V, MD 50 mL/hr at 09/03/21 1512 4 g at 09/03/21 1512  ? methimazole (TAPAZOLE) tablet 5 mg  5 mg Oral Daily Rodolph Bonghompson, Daniel V, MD   5 mg at 09/03/21 [id number]  ? propranolol (INDERAL) tablet 10 mg  10 mg Oral TID Rodolph Bonghompson, Daniel V, MD   10 mg at 09/03/21 0830  ? ? ?Allergies  ?Allergen Reactions  ? Ceftriaxone Hives, Shortness Of Breath and Itching  ?  Penicillins OK  ? Lactose Intolerance (Gi) Other (See Comments)  ?  GI upset  ? Latex Itching and Other (See Comments)  ?  discoloring of skin  ? Trazodone And Nefazodone Other (See Comments)  ?  Causes nightmares  ?: ? ?Family History  ?Problem Relation Age of Onset  ? Nephrolithiasis Father   ? Graves' disease Father   ? Thyroid disease Father   ? Hypertension Other   ? Cancer Other   ?     colon  ?: ? ? ?Social History  ? ?Socioeconomic History  ? Marital status: Single  ?  Spouse name: Not on file  ? Number of children: 2  ? Years of education: Not on file  ? Highest education level: Not on file  ?Occupational History  ? Not on file  ?Tobacco Use  ? Smoking status: Former  ?  Packs/day: 0.25  ?  Years: 5.00  ?  Pack years: 1.25  ?  Types: Cigarettes  ?  Passive exposure: Past  ? Smokeless tobacco: Never  ? Tobacco comments:  ?  Quit smoking x6 months ago  ?Vaping Use  ? Vaping Use: Never used  ?Substance and Sexual Activity  ? Alcohol use: Yes  ?  Comment: occ  ? Drug use: Yes  ?  Types: Marijuana  ?  Comment: daily  ? Sexual activity: Yes  ?  Birth control/protection: Condom  ?Other Topics Concern  ? Not on file  ?Social History Narrative  ? Not on file  ? ?Social Determinants of Health  ? ?Financial Resource Strain: Medium Risk  ? Difficulty of Paying Living Expenses: Somewhat hard  ?Food Insecurity: Food Insecurity Present  ? Worried About Programme researcher, broadcasting/film/videounning Out of Food in  the Last Year: Sometimes true  ? Ran Out of Food in the Last Year: Never true  ?Transportation Needs: No Transportation Needs  ? Lack of Transportation (Medical): No  ? Lack of Transportation (Non-Medical): No  ?Physical Activity: Insufficiently Active  ? Days of Exercise per Week: 2 days  ? Minutes of Exercise per Session: 10 min  ?Stress: Stress Concern Present  ? Feeling of Stress : Very much  ?Social Connections: Socially Integrated  ? Frequency of Communication with Friends and Family: More than three times a week  ? Frequency  of Social Gatherings with Friends and Family: Twice a week  ? Attends Religious Services: More than 4 times per year  ? Active Member of Clubs or Organizations: Yes  ? Attends Banker Meetings: More than 4 times per year  ? Marital Status: Living with partner  ?Intimate Partner Violence: Not At Risk  ? Fear of Current or Ex-Partner: No  ? Emotionally Abused: No  ? Physically Abused: No  ? Sexually Abused: No  ?: ? ?Review of Systems: A comprehensive 14 point review of systems was negative except as noted in the HPI. ? ?Exam: ?Patient Vitals for the past 24 hrs: ? BP Temp Temp src Pulse Resp SpO2 Height Weight  ?09/03/21 1518 138/62 (!) 97.5 ?F (36.4 ?C) Oral (!) 106 19 96 % -- --  ?09/03/21 1154 139/78 98.3 ?F (36.8 ?C) Oral (!) 103 (!) 22 97 % -- --  ?09/03/21 0732 (!) 144/76 98.3 ?F (36.8 ?C) Oral (!) 107 (!) 22 96 % -- --  ?09/03/21 0518 (!) 131/52 98.3 ?F (36.8 ?C) Oral (!) 117 20 99 % -- 135.7 kg  ?09/02/21 2304 (!) 143/88 98.4 ?F (36.9 ?C) Oral (!) 119 20 99 % -- --  ?09/02/21 1936 140/88 98.4 ?F (36.9 ?C) Oral (!) 117 (!) 22 98 % 5\' 10"  (1.778 m) (!) 136.8 kg  ?09/02/21 1856 (!) 155/75 98 ?F (36.7 ?C) -- (!) 119 (!) 25 100 % -- --  ?09/02/21 1800 137/73 -- -- (!) 116 20 100 % -- --  ?09/02/21 1749 -- -- -- -- -- -- -- (!) 139.3 kg  ?09/02/21 1730 (!) 105/53 -- -- (!) 111 (!) 29 100 % -- --  ?09/02/21 1709 139/86 -- -- (!) 112 20 98 % -- --  ? ? ?General:   well-nourished in no acute distress.   ?Eyes:  no scleral icterus.   ?ENT:  There were no oropharyngeal lesions.    ?Lymphatics:  Negative cervical, supraclavicular or axillary adenopathy.   ?Respiratory: Diminished breath sou

## 2021-09-03 NOTE — Consult Note (Addendum)
?Cardiology Consultation:  ? ?Patient ID: Pamela [name] ?MRN: [id number]; DOB: 1985-12-12 ? ?Admit date: 09/02/2021 ?Date of Consult: 09/03/2021 ? ?[facility] ?  ?[facility] Providers ?Cardiologist:  None      ? ? ?Patient Profile:  ? ?Pamela [name] is a 36 y.o. female with a hx of depression, anxiety, genital herpes, morbid obesity who is being seen 09/03/2021 for the evaluation of diastolic heart failure at the request of Dr. Janee Mornhompson. ? ?History of Present Illness:  ? ?Ms. [name] [facility] department to be evaluated for chest pain, shortness of breath and left leg swelling.  The patient states that over the last several weeks she has been experiencing significant increasing leg edema.  She notes that with that she started to have some shortness of breath and over few days she noticed that she would experience shortness of breath while sleeping-orthopnea, and exertional dyspnea. ? ?On first she tells me she did not pay any attention but then this got increasingly worse.  Yesterday she decided come to the ED to be evaluated. ? ?In the ED she had a work-up which showed negative Doppler for DVT, BNP was 467 troponin were negative x2.  She was anemic.  CT angiogram was negative for PE with evidence of subsegmental atelectasis in the right lung. ? ?The patient was admitted to the hospitalist service for new onset congestive heart failure. ? ?Patient was seen examined her bedside. ? ? ?Past Medical History:  ?Diagnosis Date  ? Abnormal antenatal AFP screen   ? US and Cathlean SauerHarmony was low risk.  ? Anxiety   ? Genital herpes   ? Gonorrhea   ? History of abnormal Pap smear   ? History of postpartum depression, currently pregnant   ? HSV-2 infection complicating pregnancy   ? Will suppress at 34 weeks. Don't discuss in front of FOB.  ? Mental disorder   ? hx pp depression now anxiety  ? Papanicolaou smear of cervix with positive high risk human papilloma virus (HPV) test  02/26/2021  ? 02/26/21 repeat pap in 1 year per [facility], 5 year risk for CIN3+ is 4.8%  ? Pregnant   ? Tobacco abuse 01/10/2012  ? ? ?Past Surgical History:  ?Procedure Laterality Date  ? CESAREAN SECTION  03/04/2011  ? Procedure: CESAREAN SECTION;  Surgeon: Lesly DukesKelly H. Leggett, MD;  Location: [facility];  Service: Gynecology;  Laterality: N/A;  primary of baby  boy at [id number]  ? CESAREAN SECTION N/A 10/27/2012  ? Procedure: CESAREAN SECTION;  Surgeon: Allie BossierMyra C Dove, MD;  Location: [facility] OR[facility];  Service: Obstetrics;  Laterality: N/A;  ? CHOLECYSTECTOMY  01/10/2012  ? Procedure: LAPAROSCOPIC CHOLECYSTECTOMY;  Surgeon: Dalia HeadingMark A Jenkins, MD;  Location: [facility];  Service: General;  Laterality: N/A;  attempted Intraopertive cholangiogram  ? TOE SURGERY Left   ? 02/2021  ? TONSILLECTOMY    ?  ? ?Home Medications:  ?Prior to Admission medications   ?Medication Sig Start Date End Date Taking? Authorizing Provider  ?aspirin EC 325 MG tablet Take 325 mg by mouth daily as needed for mild pain.   Yes [provider]  ?Aspirin-Acetaminophen (GOODYS BODY PAIN PO) Take 1 packet by mouth 2 (two) times daily as needed (headache/pain).   Yes [provider]  ?fluconazole (DIFLUCAN) 150 MG tablet Take 1 now and 1 in 3 days ?Patient taking differently: Take 150 mg by mouth once as needed (yeast infection). 05/16/21  Yes Adline PotterGriffin, Jennifer A,  NP  ?ibuprofen (ADVIL) 200 MG tablet Take 400-800 mg by mouth every 6 (six) hours as needed for headache or mild pain.   Yes [provider]  ?LYSINE PO Take 1 tablet by mouth daily.   Yes [provider]  ?Multiple Vitamin (MULTIVITAMIN) tablet Take 1 tablet by mouth daily.   Yes [provider]  ?Multiple Vitamins-Minerals (HAIR SKIN AND NAILS FORMULA) TABS Take 1 tablet by mouth daily.   Yes [provider]  ?OREGANO PO Take 1 capsule by mouth daily.   Yes [provider]  ?valACYclovir (VALTREX) 1000 MG tablet Take 1 tablet (1,000 mg total) by mouth 2 (two)  times daily. ?Patient taking differently: Take 1,000 mg by mouth 2 (two) times daily as needed (outbreak). 01/12/21  Yes Adline Potter, NP  ?doxycycline (VIBRA-TABS) 100 MG tablet Take 1 tablet (100 mg total) by mouth 2 (two) times daily. ?Patient not taking: Reported on 09/03/2021 07/13/21   Adline Potter, NP  ?Lactic Ac-Citric Ac-Pot Bitart (PHEXXI) 1.8-1-0.4 % GEL Use as directed before sex ?Patient not taking: Reported on 09/03/2021 02/16/21   Adline Potter, NP  ?mupirocin ointment (BACTROBAN) 2 % Apply 1 application topically 2 (two) times daily. ?Patient not taking: Reported on 09/03/2021 05/16/21   Adline Potter, NP  ?traZODone (DESYREL) 50 MG tablet Take 1 tablet (50 mg total) by mouth at bedtime. ?Patient not taking: Reported on 09/03/2021 02/16/21   Adline Potter, NP  ? ? ?Inpatient Medications: ?Scheduled Meds: ? furosemide  40 mg Intravenous Q12H  ? methimazole  5 mg Oral Daily  ? propranolol  10 mg Oral TID  ? ?Continuous Infusions: ? magnesium sulfate bolus IVPB    ? ?PRN Meds: ?acetaminophen ? ?Allergies:    ?Allergies  ?Allergen Reactions  ? Ceftriaxone Hives, Shortness Of Breath and Itching  ?  Penicillins OK  ? Lactose Intolerance (Gi) Other (See Comments)  ?  GI upset  ? Latex Itching and Other (See Comments)  ?  discoloring of skin  ? Trazodone And Nefazodone Other (See Comments)  ?  Causes nightmares  ? ? ?Social History:   ?Social History  ? ?Socioeconomic History  ? Marital status: Single  ?  Spouse name: Not on file  ? Number of children: 2  ? Years of education: Not on file  ? Highest education level: Not on file  ?Occupational History  ? Not on file  ?Tobacco Use  ? Smoking status: Former  ?  Packs/day: 0.25  ?  Years: 5.00  ?  Pack years: 1.25  ?  Types: Cigarettes  ?  Passive exposure: Past  ? Smokeless tobacco: Never  ? Tobacco comments:  ?  Quit smoking x6 months ago  ?Vaping Use  ? Vaping Use: Never used  ?Substance and Sexual Activity  ? Alcohol use: Yes  ?  Comment:  occ  ? Drug use: Yes  ?  Types: Marijuana  ?  Comment: daily  ? Sexual activity: Yes  ?  Birth control/protection: Condom  ?Other Topics Concern  ? Not on file  ?Social History Narrative  ? Not on file  ? ?Social Determinants of Health  ? ?Financial Resource Strain: Medium Risk  ? Difficulty of Paying Living Expenses: Somewhat hard  ?Food Insecurity: Food Insecurity Present  ? Worried About Programme researcher, broadcasting/film/video in the Last Year: Sometimes true  ? Ran Out of Food in the Last Year: Never true  ?Transportation Needs: No Transportation Needs  ?  Lack of Transportation (Medical): No  ? Lack of Transportation (Non-Medical): No  ?Physical Activity: Insufficiently Active  ? Days of Exercise per Week: 2 days  ? Minutes of Exercise per Session: 10 min  ?Stress: Stress Concern Present  ? Feeling of Stress : Very much  ?Social Connections: Socially Integrated  ? Frequency of Communication with Friends and Family: More than three times a week  ? Frequency of Social Gatherings with Friends and Family: Twice a week  ? Attends Religious Services: More than 4 times per year  ? Active Member of Clubs or Organizations: Yes  ? Attends Banker Meetings: More than 4 times per year  ? Marital Status: Living with partner  ?Intimate Partner Violence: Not At Risk  ? Fear of Current or Ex-Partner: No  ? Emotionally Abused: No  ? Physically Abused: No  ? Sexually Abused: No  ?  ?Family History:   ? ?Family History  ?Problem Relation Age of Onset  ? Nephrolithiasis Father   ? Graves' disease Father   ? Thyroid disease Father   ? Hypertension Other   ? Cancer Other   ?     colon  ?  ? ?ROS:  ?Please see the history of present illness.  ? ?All other ROS reviewed and negative.    ? ?Physical Exam/Data:  ? ?Vitals:  ? 09/02/21 2304 09/03/21 0518 09/03/21 0732 09/03/21 1154  ?BP: (!) 143/88 (!) 131/52 (!) 144/76 139/78  ?Pulse: (!) 119 (!) 117 (!) 107 (!) 103  ?Resp: 20 20 (!) 22 (!) 22  ?Temp: 98.4 ?F (36.9 ?C) 98.3 ?F (36.8 ?C) 98.3 ?F  (36.8 ?C) 98.3 ?F (36.8 ?C)  ?TempSrc: Oral Oral Oral Oral  ?SpO2: 99% 99% 96% 97%  ?Weight:  135.7 kg    ?Height:      ? ? ?Intake/Output Summary (Last 24 hours) at 09/03/2021 1447 ?Last data filed at 09/03/2021

## 2021-09-03 NOTE — Assessment & Plan Note (Addendum)
-   Patient presented with new onset acute diastolic CHF. ?-Patient presented with lower extremity edema, orthopnea, dyspnea on exertion, PND, noted to have elevated BNP. ?-Chest x-ray done concerning for pulmonary vascular congestion. ?-CT angiogram chest done negative for PE. ?-Patient noted to be tachycardic on admission and work-up also concerning for hyperthyroidism. ?-2D echo done with a EF of 60 to 65%,NWMA, mild LVH. ?-Troponin negative. ?-EKG with a sinus tachycardia. ?-Patient received Lasix 40 mg IV x1 in the ED with urine output of 1.350 L. ?-Patient currently on Lasix 40 mg IV every 12 hours with urine output of 5.6 L over the past 24 hours. ?-Patient is -5.866 L during this hospitalization. ?-Current weight of 292.8 pounds from 3 of 6.99 pounds on admission. ?-2D echo with a EF of 60 to 65%,NWMA, mild LVH ?-Patient with clinical improvement. ?-Continue Lasix 40 mg IV every 12 hours. ?-Continue propranolol secondary to hypothyroidism ?-Strict I's and O's, daily weights. ?-Due to new onset CHF, cardiology was consulted who are following.  ?

## 2021-09-03 NOTE — Progress Notes (Signed)
Mobility Specialist Progress Note: ? ? 09/03/21 1130  ?Mobility  ?Activity Ambulated independently in hallway  ?Level of Assistance Independent  ?Assistive Device None  ?Distance Ambulated (ft) 900 ft  ?Activity Response Tolerated well  ?$Mobility charge 1 Mobility  ? ?Pt eager for mobility. Ambulated independently on RA, SpO2 >95% throughout. Pt back in bed with all needs met.  ? ?Nelta Numbers ?Acute Rehab ?Phone: [phone] ?Office Phone: [phone] ? ?

## 2021-09-03 NOTE — Assessment & Plan Note (Addendum)
Patient renal function remained stable, electrolytes were corrected at the time of her discharge her serum cr is 0,41 with K at 4,0 and serum bicarbonate 25. Mg 1,7 ?Follow up renal function and electrolytes as outpatient. ?Continue oral furosemide for diuresis along with KCL supplementation.  ?

## 2021-09-04 ENCOUNTER — Encounter (HOSPITAL_COMMUNITY): Payer: Self-pay | Admitting: Family Medicine

## 2021-09-04 ENCOUNTER — Inpatient Hospital Stay (HOSPITAL_COMMUNITY): Payer: BC Managed Care – PPO

## 2021-09-04 ENCOUNTER — Other Ambulatory Visit (HOSPITAL_COMMUNITY): Payer: Self-pay

## 2021-09-04 DIAGNOSIS — R079 Chest pain, unspecified: Secondary | ICD-10-CM | POA: Diagnosis not present

## 2021-09-04 DIAGNOSIS — R590 Localized enlarged lymph nodes: Secondary | ICD-10-CM

## 2021-09-04 DIAGNOSIS — I509 Heart failure, unspecified: Secondary | ICD-10-CM

## 2021-09-04 DIAGNOSIS — R0600 Dyspnea, unspecified: Secondary | ICD-10-CM | POA: Diagnosis not present

## 2021-09-04 DIAGNOSIS — I5031 Acute diastolic (congestive) heart failure: Secondary | ICD-10-CM | POA: Diagnosis not present

## 2021-09-04 DIAGNOSIS — E0500 Thyrotoxicosis with diffuse goiter without thyrotoxic crisis or storm: Secondary | ICD-10-CM

## 2021-09-04 DIAGNOSIS — J810 Acute pulmonary edema: Secondary | ICD-10-CM | POA: Diagnosis not present

## 2021-09-04 LAB — CBC
HCT: 31.4 % — ABNORMAL LOW (ref 36.0–46.0)
Hemoglobin: 10.3 g/dL — ABNORMAL LOW (ref 12.0–15.0)
MCH: 26.4 pg (ref 26.0–34.0)
MCHC: 32.8 g/dL (ref 30.0–36.0)
MCV: 80.5 fL (ref 80.0–100.0)
Platelets: 275 10*3/uL (ref 150–400)
RBC: 3.9 MIL/uL (ref 3.87–5.11)
RDW: 13.3 % (ref 11.5–15.5)
WBC: 8 10*3/uL (ref 4.0–10.5)
nRBC: 0 % (ref 0.0–0.2)

## 2021-09-04 LAB — COMPREHENSIVE METABOLIC PANEL
ALT: 46 U/L — ABNORMAL HIGH (ref 0–44)
AST: 46 U/L — ABNORMAL HIGH (ref 15–41)
Albumin: 3.2 g/dL — ABNORMAL LOW (ref 3.5–5.0)
Alkaline Phosphatase: 56 U/L (ref 38–126)
Anion gap: 8 (ref 5–15)
BUN: 9 mg/dL (ref 6–20)
CO2: 25 mmol/L (ref 22–32)
Calcium: 9.8 mg/dL (ref 8.9–10.3)
Chloride: 104 mmol/L (ref 98–111)
Creatinine, Ser: 0.38 mg/dL — ABNORMAL LOW (ref 0.44–1.00)
GFR, Estimated: >60 mL/min (ref >60)
Glucose, Bld: 90 mg/dL (ref 70–99)
Potassium: 3.9 mmol/L (ref 3.5–5.1)
Sodium: 137 mmol/L (ref 135–145)
Total Bilirubin: 0.4 mg/dL (ref 0.3–1.2)
Total Protein: 6.4 g/dL — ABNORMAL LOW (ref 6.5–8.1)

## 2021-09-04 LAB — AFP TUMOR MARKER: AFP, Serum, Tumor Marker: <1.8 ng/mL (ref 0.0–6.4)

## 2021-09-04 LAB — T3, FREE: T3, Free: 17.5 pg/mL — ABNORMAL HIGH (ref 2.0–4.4)

## 2021-09-04 LAB — THYROTROPIN RECEPTOR AUTOABS: Thyrotropin Receptor Ab: 19.4 IU/L — ABNORMAL HIGH (ref 0.00–1.75)

## 2021-09-04 LAB — MAGNESIUM: Magnesium: 1.8 mg/dL (ref 1.7–2.4)

## 2021-09-04 MED ORDER — MAGNESIUM SULFATE 2 GM/50ML IV SOLN
2.0000 g | Freq: Once | INTRAVENOUS | Status: AC
  Administered 2021-09-04: 2 g via INTRAVENOUS
  Filled 2021-09-04: qty 50

## 2021-09-04 MED ORDER — PROPRANOLOL HCL 20 MG PO TABS
20.0000 mg | ORAL_TABLET | Freq: Three times a day (TID) | ORAL | Status: DC
  Administered 2021-09-04 – 2021-09-06 (×6): 20 mg via ORAL
  Filled 2021-09-04 (×6): qty 1

## 2021-09-04 MED ORDER — GUAIFENESIN-DM 100-10 MG/5ML PO SYRP
5.0000 mL | ORAL_SOLUTION | Freq: Once | ORAL | Status: AC
  Administered 2021-09-04: 5 mL via ORAL
  Filled 2021-09-04: qty 5

## 2021-09-04 MED ORDER — IOHEXOL 300 MG/ML  SOLN
100.0000 mL | Freq: Once | INTRAMUSCULAR | Status: AC | PRN
  Administered 2021-09-04: 100 mL via INTRAVENOUS

## 2021-09-04 MED ORDER — METHIMAZOLE 10 MG PO TABS
10.0000 mg | ORAL_TABLET | Freq: Every day | ORAL | Status: DC
Start: 2021-09-04 — End: 2021-09-06
  Administered 2021-09-04 – 2021-09-06 (×3): 10 mg via ORAL
  Filled 2021-09-04 (×3): qty 1

## 2021-09-04 MED ORDER — ZOLPIDEM TARTRATE 5 MG PO TABS
2.5000 mg | ORAL_TABLET | Freq: Once | ORAL | Status: AC
  Administered 2021-09-04: 2.5 mg via ORAL
  Filled 2021-09-04: qty 1

## 2021-09-04 MED ORDER — GUAIFENESIN-DM 100-10 MG/5ML PO SYRP: 5.0000 mL | ORAL_SOLUTION | ORAL | Status: DC | PRN

## 2021-09-04 MED ORDER — HYDROXYZINE HCL 25 MG PO TABS
25.0000 mg | ORAL_TABLET | Freq: Once | ORAL | Status: AC
  Administered 2021-09-04: 25 mg via ORAL
  Filled 2021-09-04: qty 1

## 2021-09-04 NOTE — Progress Notes (Signed)
Heart Failure Nurse Navigator Progress Note ? ? ?Updated [facility]. No immediate social needs noted. Pt states she is "doing ok" financially, unfortunately she and her young kids do not qualify for SNAP/food stamps but do eat out often at fast food restaurants several times a week due to cost vs cost of grocery shopping and increase in food prices.  ? ?Social History  ? ?Socioeconomic History  ? Marital status: Single  ?  Spouse name: Not on file  ? Number of children: 2  ? Years of education: Not on file  ? Highest education level: Not on file  ?Occupational History  ? Occupation: works at group home  ?Tobacco Use  ? Smoking status: Former  ?  Packs/day: 0.25  ?  Years: 5.00  ?  Pack years: 1.25  ?  Types: Cigarettes  ?  Passive exposure: Past  ? Smokeless tobacco: Never  ? Tobacco comments:  ?  Quit smoking x6 months ago  ?Vaping Use  ? Vaping Use: Never used  ?Substance and Sexual Activity  ? Alcohol use: Yes  ?  Comment: occ  ? Drug use: Yes  ?  Frequency: 5.0 times per week  ?  Types: Marijuana  ?  Comment: daily  ? Sexual activity: Yes  ?  Birth control/protection: Condom  ?Other Topics Concern  ? Not on file  ?Social History Narrative  ? Not on file  ? ?Social Determinants of Health  ? ?Financial Resource Strain: Medium Risk  ? Difficulty of Paying Living Expenses: Somewhat hard  ?Food Insecurity: No Food Insecurity  ? Worried About Programme researcher, broadcasting/film/video in the Last Year: Never true  ? Ran Out of Food in the Last Year: Never true  ?Transportation Needs: No Transportation Needs  ? Lack of Transportation (Medical): No  ? Lack of Transportation (Non-Medical): No  ?Physical Activity: Insufficiently Active  ? Days of Exercise per Week: 2 days  ? Minutes of Exercise per Session: 10 min  ?Stress: Stress Concern Present  ? Feeling of Stress : Very much  ?Social Connections: Socially Integrated  ? Frequency of Communication with Friends and Family: More than three times a week  ? Frequency of Social Gatherings with Friends  and Family: Twice a week  ? Attends Religious Services: More than 4 times per year  ? Active Member of Clubs or Organizations: Yes  ? Attends Banker Meetings: More than 4 times per year  ? Marital Status: Living with partner  ? ? ?Ozella Rocks, MSN, RN ?Heart Failure Nurse Navigator ?[phone] ? ?

## 2021-09-04 NOTE — Assessment & Plan Note (Signed)
-   Likely secondary to hyperthyroidism. ?-Increase propranolol to 20 mg 3 times daily. ?-Follow. ?

## 2021-09-04 NOTE — Progress Notes (Signed)
Heart Failure Stewardship Pharmacist Progress Note ? ? ?PCP: [facility] ?PCP-Cardiologist: None  ? ? ?HPI:  ?36 yo F with PMH of depression, anxiety, genital herpes, and morbid obesity. She presented to the ED on 4/1 with chest pain and palpitations. CXR with pulmonary vascular congestion. An ECHO was done on 4/3 and LVEF was 60-65%. ? ?Current HF Medications: ?Diuretic: furosemide 40 mg IV BID ? ?Prior to admission HF Medications: ?None ? ?Pertinent Lab Values: ?Serum creatinine 0.38, BUN 9, Potassium 3.9, Sodium 137, BNP 467.4, Magnesium 1.8, A1c 6.0  ? ?Vital Signs: ?Weight: 292 lbs (admission weight: 301 lbs) ?Blood pressure: 150/70s  ?Heart rate: 90-100s  ?I/O: -5.4L yesterday; net -5.9L ? ?Medication Assistance / Insurance Benefits Check: ?Does the patient have prescription insurance?  Yes ?Type of insurance plan: Managed Medicaid ? ?Outpatient Pharmacy:  ?Prior to admission outpatient [facility] ?Is the patient willing to use [facility] at discharge? Yes ?Is the patient willing to transition their outpatient pharmacy to utilize a [facility] outpatient pharmacy?   Pending ?  ? ?Assessment: ?1. Acute diastolic CHF (EF 123456). NYHA class III symptoms. ?- Continue furosemide 40 mg IV BID ?- Consider starting Entresto 24/26 mg BID for additional BP lowering with HFpEF ?- Consider adding SGLT2i prior to discharge ?  ?Plan: ?1) Medication changes recommended at this time: ?- Add Entresto 24/26 mg BID ? ?2) Patient assistance: ?- Entresto $4 ? ?3)  Education  ?- To be completed prior to discharge ? ?Kerby Nora, PharmD, BCPS ?Heart Failure Stewardship Pharmacist ?Phone [phone] ? ? ?

## 2021-09-04 NOTE — Consult Note (Addendum)
[address]       [place] [address]             [phone]        Pamela [name] Date of Birth: [birth year]  Referring: Dr. Mack Hook Primary Care: Health, [facility] Public Consultants: Dr. Jolyn Lent Tobb--Cardiology   Dr. Judie Petit. Mohamed--Oncology  Reason for Consult:  Evaluation and management of anterior mediastinal mass.    History of Present Illness:  General ill-looking is a 36 year old white female with history of anxiety, depression, gonorrhea, genital herpes, and class III obesity.  She presented to the [facility] emergency department on 420 with complaint of sharp, pleuritic chest pain of about 2 days duration associated with dyspnea on exertion, orthopnea, and left lower extremity swelling.  Initial evaluation in the ED revealed tachypnea and tachycardia.  She was mildly hypertensive.  EKG showed sinus tachycardia but no ischemic changes.  High-sensitivity troponin was within normal limits but BNP was elevated at 467.  Chest x-ray showed bibasilar decrease in lung volumes but was otherwise clear.  Basic metabolic panel was within normal limits.  CBC showed hemoglobin of 10 g but was otherwise unremarkable.  Left lower extremity ultrasound ruled out DVT.  She had a CTA chest PE protocol showing no pulmonary embolus.  However there was a soft tissue density in the superior mediastinum with diffuse enlargement of the thyroid gland.  There was also mediastinal and bilateral hilar adenopathy suspicious for lymphoma.  She was admitted to the hospital by the hospitalist service.  2D echo was obtained yesterday showing ejection fraction of 60 to 65%.  There was no wall motion abnormality.  There was mild left ventricular hypertrophy and no significant valvular disease.  Further work-up included a thyroid panel showing hyperthyroidism with a T4 of 5.5 and TSH of 0.1.  The cardiology service was consulted.  The patient was seen by  Dr. Ahmed Prima and it was concluded the patient likely had some diastolic heart failure exacerbated by hyperthyroidism and associated tachycardia.  Pamela [name] was started on Lasix twice daily.  She improved symptomatically. Pamela [name] was also seen by medical oncology team Cranford Mon, NP and Dr. Arbutus Ped) given the finding of the anterior mediastinal mass and hilar/mediastinal adenopathy.  CT scanning of the abdomen and pelvis was recommended and was performed yesterday showing no significant pathology.  Biopsy of the anterior mediastinal mass and or the mediastinal/hilar adenopathy was also recommended and CT surgery was asked to see Pamela [name] to facilitate this part of the work-up.   Currently, Pamela [name] is much more comfortable than when she arrived to the ED.   She has had a 7kg decrease in her weight with diuresis. She denies chest pain or shortness of breath. She also denies any visual changes or muscular weakness.      Zubrod Score: At the time of surgery this patient's most appropriate activity status/level should be described as: []     0    Normal activity, no symptoms [x]     1    Restricted in physical strenuous activity but ambulatory, able to do out light work []     2    Ambulatory and capable of self care, unable to do work activities, up and about                 more than 50%  Of the time                            []   3    Only limited self care, in bed greater than 50% of waking hours []     4    Completely disabled, no self care, confined to bed or chair []     5    Moribund  Past Medical History:  Diagnosis Date   Abnormal antenatal AFP screen    [place] and [place] was low risk.   Anxiety    Genital herpes    Gonorrhea    History of abnormal Pap smear    History of postpartum depression, currently pregnant    HSV-2 infection complicating pregnancy    Will suppress at 34 weeks. Don't discuss in front of FOB.   Mental disorder    hx pp depression now anxiety   Papanicolaou  smear of cervix with positive high risk human papilloma virus (HPV) test 02/26/2021   02/26/21 repeat pap in 1 year per [facility], 5 year risk for CIN3+ is 4.8%   Pregnant    Tobacco abuse 01/10/2012    Past Surgical History:  Procedure Laterality Date   CESAREAN SECTION  03/04/2011   Procedure: CESAREAN SECTION;  Surgeon: Lesly Dukes, MD;  Location: [facility];  Service: Gynecology;  Laterality: N/A;  primary of baby  boy at 55   CESAREAN SECTION N/A 10/27/2012   Procedure: CESAREAN SECTION;  Surgeon: Allie Bossier, MD;  Location: [facility];  Service: Obstetrics;  Laterality: N/A;   CHOLECYSTECTOMY  01/10/2012   Procedure: LAPAROSCOPIC CHOLECYSTECTOMY;  Surgeon: Dalia Heading, MD;  Location: [facility];  Service: General;  Laterality: N/A;  attempted Intraopertive cholangiogram   TOE SURGERY Left    02/2021   TONSILLECTOMY      Social History   Tobacco Use  Smoking Status Former   Packs/day: 0.25   Years: 5.00   Pack years: 1.25   Types: Cigarettes   Passive exposure: Past  Smokeless Tobacco Never  Tobacco Comments   Quit smoking x6 months ago    Social History   Substance and Sexual Activity  Alcohol Use Yes   Comment: occ     Allergies  Allergen Reactions   Ceftriaxone Hives, Shortness Of Breath and Itching    Penicillins OK   Lactose Intolerance (Gi) Other (See Comments)    GI upset   Latex Itching and Other (See Comments)    discoloring of skin   Trazodone And Nefazodone Other (See Comments)    Causes nightmares    Current Facility-Administered Medications  Medication Dose Route Frequency Provider Last Rate Last Admin   0.9 %  sodium chloride infusion   Intravenous PRN Rodolph Bong, MD   Stopped at 09/03/21 1900   acetaminophen (TYLENOL) tablet 650 mg  650 mg Oral Q6H PRN John Giovanni, MD   650 mg at 09/02/21 2302   enoxaparin (LOVENOX) injection 40 mg  40 mg Subcutaneous Q24H Rodolph Bong, MD   40 mg at 09/03/21 1752   furosemide (LASIX) injection 40  mg  40 mg Intravenous Q12H Rodolph Bong, MD   40 mg at 09/04/21 4098   methimazole (TAPAZOLE) tablet 10 mg  10 mg Oral Daily Rodolph Bong, MD       pneumococcal 20-valent conjugate vaccine (PREVNAR 20) injection 0.5 mL  0.5 mL Intramuscular Tomorrow-1000 Rodolph Bong, MD       propranolol (INDERAL) tablet 20 mg  20 mg Oral TID Tobb, Kardie, DO        Medications Prior to Admission  Medication Sig Dispense Refill Last  Dose   aspirin EC 325 MG tablet Take 325 mg by mouth daily as needed for mild pain.   Past Month   Aspirin-Acetaminophen (GOODYS BODY PAIN PO) Take 1 packet by mouth 2 (two) times daily as needed (headache/pain).   Past Week   fluconazole (DIFLUCAN) 150 MG tablet Take 1 now and 1 in 3 days (Patient taking differently: Take 150 mg by mouth once as needed (yeast infection).) 2 tablet 1 Past Month   ibuprofen (ADVIL) 200 MG tablet Take 400-800 mg by mouth every 6 (six) hours as needed for headache or mild pain.   Past Week   LYSINE PO Take 1 tablet by mouth daily.   Past Week   Multiple Vitamin (MULTIVITAMIN) tablet Take 1 tablet by mouth daily.   Past Week   Multiple Vitamins-Minerals (HAIR SKIN AND NAILS FORMULA) TABS Take 1 tablet by mouth daily.   Past Week   OREGANO PO Take 1 capsule by mouth daily.   Past Week   valACYclovir (VALTREX) 1000 MG tablet Take 1 tablet (1,000 mg total) by mouth 2 (two) times daily. (Patient taking differently: Take 1,000 mg by mouth 2 (two) times daily as needed (outbreak).) 20 tablet 1 unk   doxycycline (VIBRA-TABS) 100 MG tablet Take 1 tablet (100 mg total) by mouth 2 (two) times daily. (Patient not taking: Reported on 09/03/2021) 20 tablet 0 Completed Course   Lactic Ac-Citric Ac-Pot Bitart (PHEXXI) 1.8-1-0.4 % GEL Use as directed before sex (Patient not taking: Reported on 09/03/2021) 5 g 0 Not Taking   mupirocin ointment (BACTROBAN) 2 % Apply 1 application topically 2 (two) times daily. (Patient not taking: Reported on 09/03/2021) 22 g 0  Completed Course   traZODone (DESYREL) 50 MG tablet Take 1 tablet (50 mg total) by mouth at bedtime. (Patient not taking: Reported on 09/03/2021) 30 tablet 2 Not Taking    Family History  Problem Relation Age of Onset   Nephrolithiasis Father    Graves' disease Father    Thyroid disease Father    Hypertension Other    Cancer Other        colon     Review of Systems:       Cardiac Review of Systems: Y or  [    ]= no  Chest Pain [   x ]  Resting SOB [ x  ] Exertional SOB  [x  ]  Orthopnea [x  ]   Pedal Edema [  x ]    Palpitations [  ] Syncope  [  ]   Presyncope [   ]  General Review of Systems: [Y] = yes [  ]=no Constitional: recent weight change [  ]; anorexia [  ]; fatigue [  ]; nausea [  ]; night sweats [  ]; fever [  ]; or chills [  ]                                                               Dental: Last Dentist visit:   Eye : blurred vision [  ]; diplopia [   ]; vision changes [  ];  Amaurosis fugax[  ]; Resp: cough [  ];  wheezing[  ];  hemoptysis[  ]; shortness of breath[x  ]; paroxysmal nocturnal dyspnea[  ];  dyspnea on exertion[ x ]; or orthopnea[  x];  GI:  gallstones[  ], vomiting[  ];  dysphagia[  ]; melena[  ];  hematochezia [  ]; heartburn[  ];   Hx of  Colonoscopy[  ]; GU: kidney stones [  ]; hematuria[  ];   dysuria [  ];  nocturia[  ];  history of     obstruction [  ]; urinary frequency [  ]             Skin: rash, swelling[  ];, hair loss[  ];  peripheral edema[  ];  or itching[  ]; Musculosketetal: myalgias[  ];  joint swelling[  ];  joint erythema[  ];  joint pain[  ];  back pain[  ];  Heme/Lymph: bruising[  ];  bleeding[  ];  anemia[ x ];  Neuro: TIA[  ];  headaches[  ];  stroke[  ];  vertigo[  ];  seizures[  ];   paresthesias[  ];  difficulty walking[  ];  Psych:depression[ x ]; anxiety[ x ];  Endocrine: diabetes[  ];  thyroid dysfunction[  ];            Physical Exam: BP (!) 151/71 (BP Location: Left Arm)   Pulse 100   Temp 98.1 F (36.7 C) (Oral)    Resp 20   Ht 5\' 10"  (1.778 m)   Wt 132.8 kg   LMP 08/27/2021 Comment: Recent negative Preg test 09/02/2021  SpO2 93%   BMI 42.01 kg/m    General appearance: alert, cooperative, and no distress Head: Normocephalic, without obvious abnormality, atraumatic Neck: no adenopathy, no carotid bruit, no JVD, and supple, symmetrical, trachea midline Lymph nodes: Cervical, supraclavicular, and axillary nodes normal. Resp: clear to auscultation bilaterally Cardio: mild tachycardia, no murmur GI: soft, NT Extremities: no deformities, no peripheral edema. All extremities well perfused Neurologic: Grossly normal  Diagnostic Studies & Laboratory data:     Recent Radiology Findings:   DG Chest 2 View  Result Date: 09/02/2021 CLINICAL DATA:  Chest pain and difficulty breathing. EXAM: CHEST - 2 VIEW COMPARISON:  11/17/2018 FINDINGS: Heart size and mediastinal contours are normal. Pulmonary vascular congestion is noted. No pleural effusion or frank interstitial edema. No superimposed airspace consolidation. Visualized osseous structures are unremarkable. IMPRESSION: Pulmonary vascular congestion. Electronically Signed   By: Signa Kell M.D.   On: 09/02/2021 13:21   CT Angio Chest PE W and/or Wo Contrast  Result Date: 09/02/2021 CLINICAL DATA:  Chest pain. EXAM: CT ANGIOGRAPHY CHEST WITH CONTRAST TECHNIQUE: Multidetector CT imaging of the chest was performed using the standard protocol during bolus administration of intravenous contrast. Multiplanar CT image reconstructions and MIPs were obtained to evaluate the vascular anatomy. RADIATION DOSE REDUCTION: This exam was performed according to the departmental dose-optimization program which includes automated exposure control, adjustment of the mA and/or kV according to patient size and/or use of iterative reconstruction technique. CONTRAST:  OMNIPAQUE IOHEXOL 350 MG/ML SOLN COMPARISON:  CT angio chest 01/20/2012 FINDINGS: Cardiovascular: Satisfactory  opacification of the pulmonary arteries to the segmental level. No evidence of pulmonary embolism. Normal heart size. No pericardial effusion. Mediastinum/Nodes: New soft tissue density is noted within the anterior superior mediastinum between the sternum and the great vessels (series 4, image 28). There is some extension of the soft tissue into the left superior mediastinum to the level of the AP window (series 4, image 41). The soft tissue is distinctly separate from the thyroid. Multiple enlarged mediastinal and hilar nodes are  noted including: -1.3 cm short axis right pretracheal node (series 4, image 44) -1.0 cm short axis node at the AP window (series 4, image 44) -1.6 cm right hilar node (series 4, image 59) -1.4 cm left hilar node (series 4, image 66) No axillary lymphadenopathy. Partially visualized thyroid appears diffusely enlarged compared to prior exam on 01/20/2012. The trachea and esophagus are unremarkable. Lungs/Pleura: Linear subsegmental atelectasis noted in the right lung. Left lung is clear. No pleural effusion or pneumothorax. Upper Abdomen: No acute abnormality. Musculoskeletal: No chest wall abnormality. No acute or significant osseous findings. Review of the MIP images confirms the above findings. IMPRESSION: 1. No pulmonary embolism. 2. Subsegmental atelectasis in the right lung. The lungs are otherwise clear. 3. New soft tissue density in the anterior superior mediastinum, incompletely evaluated on this arterial phase exam. Also noted is diffuse enlargement of the thyroid gland. Findings could represent thymic hyperplasia in the setting of Graves' disease. Recommend correlation with thyroid panel. 4. Mediastinal and bilateral hilar lymphadenopathy with new soft tissue density in the anterior superior mediastinum also raises differential consideration of malignancy, including lymphoma. Electronically Signed   By: Sherron Ales M.D.   On: 09/02/2021 14:50   CT ABDOMEN PELVIS W  CONTRAST  Result Date: 09/04/2021 CLINICAL DATA:  Hematologic malignancy, staging EXAM: CT ABDOMEN AND PELVIS WITH CONTRAST TECHNIQUE: Multidetector CT imaging of the abdomen and pelvis was performed using the standard protocol following bolus administration of intravenous contrast. RADIATION DOSE REDUCTION: This exam was performed according to the departmental dose-optimization program which includes automated exposure control, adjustment of the mA and/or kV according to patient size and/or use of iterative reconstruction technique. CONTRAST:  OMNIPAQUE IOHEXOL 300 MG/ML  SOLN COMPARISON:  None. FINDINGS: Lower chest: No acute abnormality Hepatobiliary: No focal liver abnormality is seen. Status post cholecystectomy. No biliary dilatation. Pancreas: No focal abnormality or ductal dilatation. Spleen: No focal abnormality.  Normal size. Adrenals/Urinary Tract: No adrenal abnormality. No focal renal abnormality. No stones or hydronephrosis. Urinary bladder is unremarkable. Stomach/Bowel: Stomach, large and small bowel grossly unremarkable. Normal appendix. Vascular/Lymphatic: No evidence of aneurysm or adenopathy. Small bilateral inguinal lymph nodes, none pathologically enlarged. Largest lymph node has a short axis diameter of 6 mm. Reproductive: Uterus and adnexa unremarkable.  No mass. Other: No free fluid or free air. Musculoskeletal: No acute bony abnormality. IMPRESSION: No acute findings or significant abnormality in the abdomen or pelvis. Electronically Signed   By: Charlett Nose M.D.   On: 09/04/2021 02:34   US Venous Img Lower Unilateral Left  Result Date: 09/02/2021 CLINICAL DATA:  Swelling x3 weeks EXAM: LEFT LOWER EXTREMITY VENOUS DOPPLER ULTRASOUND TECHNIQUE: Gray-scale sonography with compression, as well as color and duplex ultrasound, were performed to evaluate the deep venous system(s) from the level of the common femoral vein through the popliteal and proximal calf veins. COMPARISON:  None  available FINDINGS: VENOUS Normal compressibility of the common femoral, superficial femoral, and popliteal veins, as well as the visualized calf veins. Visualized portions of profunda femoral vein and great saphenous vein unremarkable. No filling defects to suggest DVT on grayscale or color Doppler imaging. Doppler waveforms show normal direction of venous flow, normal respiratory plasticity and response to augmentation. Limited views of the contralateral common femoral vein are unremarkable. OTHER None. Limitations: none IMPRESSION: Negative. Electronically Signed   By: Corlis Leak M.D.   On: 09/02/2021 16:19   ECHOCARDIOGRAM COMPLETE  Result Date: 09/03/2021    ECHOCARDIOGRAM REPORT   Patient Name:  [name] Date of Exam: 09/03/2021 Medical Rec #:  [id number]    Height:       70.0 in Accession #:    [id number]   Weight:       299.1 lb Date of Birth:  [birth year]   BSA:          2.477 m Patient Age:    35 years     BP:           144/76 mmHg Patient Gender: F            HR:           97 bpm. Exam Location:  Inpatient Procedure: 2D Echo, 3D Echo, Color Doppler and Cardiac Doppler Indications:    Elevated BNP  History:        Patient has no prior history of Echocardiogram examinations.  Sonographer:    Irving Burton Senior RDCS Referring Phys: [id number] VASUNDHRA RATHORE IMPRESSIONS  1. Left ventricular ejection fraction, by estimation, is 60 to 65%. The left ventricle has normal function. The left ventricle has no regional wall motion abnormalities. There is mild left ventricular hypertrophy. Left ventricular diastolic parameters were normal.  2. Right ventricular systolic function is normal. The right ventricular size is normal.  3. The mitral valve is normal in structure. Trivial mitral valve regurgitation. No evidence of mitral stenosis.  4. The aortic valve is tricuspid. Aortic valve regurgitation is not visualized. No aortic stenosis is present.  5. The inferior vena cava is normal in size with greater than 50%  respiratory variability, suggesting right atrial pressure of 3 mmHg. Comparison(s): No prior Echocardiogram. FINDINGS  Left Ventricle: Left ventricular ejection fraction, by estimation, is 60 to 65%. The left ventricle has normal function. The left ventricle has no regional wall motion abnormalities. The left ventricular internal cavity size was normal in size. There is  mild left ventricular hypertrophy. Left ventricular diastolic parameters were normal. Right Ventricle: The right ventricular size is normal. Right ventricular systolic function is normal. Left Atrium: Left atrial size was normal in size. Right Atrium: Right atrial size was normal in size. Pericardium: There is no evidence of pericardial effusion. Mitral Valve: The mitral valve is normal in structure. Mild mitral annular calcification. Trivial mitral valve regurgitation. No evidence of mitral valve stenosis. Tricuspid Valve: The tricuspid valve is normal in structure. Tricuspid valve regurgitation is trivial. No evidence of tricuspid stenosis. Aortic Valve: The aortic valve is tricuspid. Aortic valve regurgitation is not visualized. No aortic stenosis is present. Pulmonic Valve: The pulmonic valve was normal in structure. Pulmonic valve regurgitation is not visualized. No evidence of pulmonic stenosis. Aorta: The aortic root is normal in size and structure. Venous: The inferior vena cava is normal in size with greater than 50% respiratory variability, suggesting right atrial pressure of 3 mmHg. IAS/Shunts: The interatrial septum was not well visualized.  LEFT VENTRICLE PLAX 2D LVIDd:         4.60 cm      Diastology LVIDs:         3.60 cm      LV e' medial:    8.70 cm/s LV PW:         1.10 cm      LV E/e' medial:  12.1 LV IVS:        1.20 cm      LV e' lateral:   9.25 cm/s LVOT diam:     1.90 cm      LV E/e' lateral: 11.4 LV  SV:         61 LV SV Index:   24 LVOT Area:     2.84 cm                              3D Volume EF: LV Volumes (MOD)             3D EF:        45 % LV vol d, MOD A2C: 115.0 ml LV EDV:       197 ml LV vol d, MOD A4C: 132.0 ml LV ESV:       109 ml LV vol s, MOD A2C: 56.7 ml  LV SV:        88 ml LV vol s, MOD A4C: 68.3 ml LV SV MOD A2C:     58.3 ml LV SV MOD A4C:     132.0 ml LV SV MOD BP:      61.9 ml RIGHT VENTRICLE RV S prime:     13.60 cm/s TAPSE (M-mode): 2.2 cm LEFT ATRIUM             Index        RIGHT ATRIUM           Index LA diam:        4.00 cm 1.62 cm/m   RA Area:     20.20 cm LA Vol (A2C):   72.1 ml 29.11 ml/m  RA Volume:   61.10 ml  24.67 ml/m LA Vol (A4C):   50.7 ml 20.47 ml/m LA Biplane Vol: 61.6 ml 24.87 ml/m  AORTIC VALVE LVOT Vmax:   127.00 cm/s LVOT Vmean:  77.300 cm/s LVOT VTI:    0.214 m  AORTA Ao Root diam: 2.80 cm Ao Asc diam:  2.90 cm MITRAL VALVE MV Area (PHT): 3.27 cm     SHUNTS MV Decel Time: 232 msec     Systemic VTI:  0.21 m MV E velocity: 105.00 cm/s  Systemic Diam: 1.90 cm MV A velocity: 64.90 cm/s MV E/A ratio:  1.62 Olga Millers MD Electronically signed by Olga Millers MD Signature Date/Time: 09/03/2021/12:07:42 PM    Final    US THYROID  Result Date: 09/03/2021 CLINICAL DATA:  Thyromegaly , low TSH for EXAM: THYROID ULTRASOUND TECHNIQUE: Ultrasound examination of the thyroid gland and adjacent soft tissues was performed. COMPARISON:  CT 09/02/2021 and previous FINDINGS: Parenchymal Echotexture: Markedly heterogenous, hyperemic Isthmus: 0.8 cm thickness Right lobe: 7.8 x 3.1 x 3.7 cm Left lobe: 7.9 x 3.1 x 2.9 cm _________________________________________________________ Estimated total number of nodules >/= 1 cm: 0 Number of spongiform nodules >/=  2 cm not described below (TR1): 0 Number of mixed cystic and solid nodules >/= 1.5 cm not described below (TR2): 0 _________________________________________________________ No discrete nodules are seen within the thyroid gland. No regional cervical adenopathy identified. IMPRESSION: 1. Thyromegaly with heterogenous hyperemic parenchyma. 2. No nodule or  other indication for biopsy or further imaging evaluation. The above is in keeping with the ACR TI-RADS recommendations - J Am Coll Radiol 2017;14:587-595. Electronically Signed   By: Corlis Leak M.D.   On: 09/03/2021 07:33     I have independently reviewed the above radiologic studies and discussed with the patient   Recent Lab Findings: Lab Results  Component Value Date   WBC 8.0 09/04/2021   HGB 10.3 (L) 09/04/2021   HCT 31.4 (L) 09/04/2021   PLT 275 09/04/2021   GLUCOSE 90  09/04/2021   CHOL 186 04/07/2019   TRIG 70 04/07/2019   HDL 49 04/07/2019   LDLCALC 124 (H) 04/07/2019   ALT 46 (H) 09/04/2021   AST 46 (H) 09/04/2021   NA 137 09/04/2021   K 3.9 09/04/2021   CL 104 09/04/2021   CREATININE 0.38 (L) 09/04/2021   BUN 9 09/04/2021   CO2 25 09/04/2021   TSH 0.001 (L) 09/02/2021   INR 1.14 01/10/2012   HGBA1C 6.0 (H) 05/16/2021      Assessment / Plan:    36 year old female with the above described past medical history is discovered to have an anterior mediastinal mass with hilar and mediastinal adenopathy.  She has newly diagnosed hyperthyroidism, likely Graves' disease, with acute exacerbation of diastolic heart failure.  She responded well to diuresis.  Dr. Cliffton Asters has reviewed the clinical history and available imaging.  He will plan to see Ms. Helmlinger in the office in about 2 weeks and we will likely repeat the CT in a few months before pursuing biopsy or surgical resection.    Follow up with Dr. Cliffton Asters is scheduled for Friday, 09/21/21 at 10:40am.   I  spent 20 minutes counseling the patient face to face.   Leary Roca, PA-C  09/04/2021 10:42 AM   Agree with above 36 year old female admitted with new onset congestive heart failure and history of Graves' disease who was noted to have an anterior mediastinal adenopathy.  She also has some evidence of hilar adenopathy.  Cross-sectional imaging was reviewed, we will see her back as an outpatient.  Adhrit Krenz Keane Scrape

## 2021-09-04 NOTE — Progress Notes (Addendum)
? ?Progress Note ? ?Patient Name: Pamela [name] ?Date of Encounter: 09/04/2021 ? ?Primary Cardiologist: None  ? ?Subjective  ? ?Patient seen and examined at bedside.  She was sleeping when I arrived.  She opened her eyes to voice and offer no complaints at this time. ? ?Inpatient Medications  ?  ?Scheduled Meds: ? enoxaparin (LOVENOX) injection  40 mg Subcutaneous Q24H  ? furosemide  40 mg Intravenous Q12H  ? methimazole  5 mg Oral Daily  ? pneumococcal 20-valent conjugate vaccine  0.5 mL Intramuscular Tomorrow-1000  ? propranolol  10 mg Oral TID  ? ?Continuous Infusions: ? sodium chloride Stopped (09/03/21 1900)  ? magnesium sulfate bolus IVPB    ? ?PRN Meds: ?sodium chloride, acetaminophen  ? ?Vital Signs  ?  ?Vitals:  ? 09/03/21 1518 09/03/21 2019 09/04/21 0419 09/04/21 0420  ?BP: 138/62 (!) 141/60 (!) 151/71   ?Pulse: (!) 106 (!) 104 (!) 101 100  ?Resp: 19 20 20 20   ?Temp: (!) 97.5 ?F (36.4 ?C) 98.7 ?F (37.1 ?C) 98.1 ?F (36.7 ?C)   ?TempSrc: Oral Oral Oral   ?SpO2: 96% 95% 93%   ?Weight:   132.8 kg   ?Height:      ? ? ?Intake/Output Summary (Last 24 hours) at 09/04/2021 0801 ?Last data filed at 09/04/2021 0750 ?Gross per 24 hour  ?Intake 1063.48 ml  ?Output 5600 ml  ?Net -4536.52 ml  ? ?Filed Weights  ? 09/02/21 1936 09/03/21 0518 09/04/21 0419  ?Weight: (!) 136.8 kg 135.7 kg 132.8 kg  ? ? ?Telemetry  ?  ?Sinus tachycardia- Personally Reviewed ? ?ECG  ?  ?None- Personally Reviewed ? ?Physical Exam  ?  ?General: Comfortable ?Head: Atraumatic, normal size  ?Eyes: PEERLA, EOMI  ?Neck: Supple, normal JVD ?Cardiac: Normal S1, S2; RRR; no murmurs, rubs, or gallops ?Lungs: Clear to auscultation bilaterally ?Abd: Soft, nontender, no hepatomegaly  ?Ext: warm, +1 bilateral lower extremity edema ?Musculoskeletal: No deformities, BUE and BLE strength normal and equal ?Skin: Warm and dry, no rashes   ?Neuro: Alert and oriented to person, place, time, and situation, CNII-XII grossly intact, no focal deficits  ?Psych: Normal mood  and affect  ? ?Labs  ?  ?Chemistry ?Recent Labs  ?Lab 09/02/21 ?1258 09/03/21 ?11/03/21 09/04/21 ?0429  ?NA 139 137 137  ?K 3.6 3.7 3.9  ?CL 104 102 104  ?CO2 22 27 25   ?GLUCOSE 83 120* 90  ?BUN 9 5* 9  ?CREATININE <0.30* 0.38* 0.38*  ?CALCIUM 10.3 10.0 9.8  ?PROT  --  6.5 6.4*  ?ALBUMIN  --  3.3* 3.2*  ?AST  --  58* 46*  ?ALT  --  52* 46*  ?ALKPHOS  --  62 56  ?BILITOT  --  0.7 0.4  ?GFRNONAA NOT CALCULATED >60 >60  ?ANIONGAP 13 8 8   ?  ? ?Hematology ?Recent Labs  ?Lab 09/02/21 ?1258 09/02/21 ?2357 09/03/21 ?11/02/21 09/04/21 ?0429  ?WBC 7.1  --  5.4 8.0  ?RBC 3.88 3.81* 3.98 3.90  ?HGB 10.0*  --  10.4* 10.3*  ?HCT 31.5*  --  31.9* 31.4*  ?MCV 81.2  --  80.2 80.5  ?MCH 25.8*  --  26.1 26.4  ?MCHC 31.7  --  32.6 32.8  ?RDW 13.4  --  13.4 13.3  ?PLT 266  --  273 275  ? ? ?Cardiac EnzymesNo results for input(s): TROPONINI in the last 168 hours. No results for input(s): TROPIPOC in the last 168 hours.  ? ?BNP ?Recent Labs  ?Lab 09/02/21 ?  1329 09/03/21 ?8315  ?BNP 467.4* 318.2*  ?  ? ?DDimer No results for input(s): DDIMER in the last 168 hours.  ? ?Radiology  ?  ?DG Chest 2 View ? ?Result Date: 09/02/2021 ?CLINICAL DATA:  Chest pain and difficulty breathing. EXAM: CHEST - 2 VIEW COMPARISON:  11/17/2018 FINDINGS: Heart size and mediastinal contours are normal. Pulmonary vascular congestion is noted. No pleural effusion or frank interstitial edema. No superimposed airspace consolidation. Visualized osseous structures are unremarkable. IMPRESSION: Pulmonary vascular congestion. Electronically Signed   By: Signa Kell M.D.   On: 09/02/2021 13:21  ? ?CT Angio Chest PE W and/or Wo Contrast ? ?Result Date: 09/02/2021 ?CLINICAL DATA:  Chest pain. EXAM: CT ANGIOGRAPHY CHEST WITH CONTRAST TECHNIQUE: Multidetector CT imaging of the chest was performed using the standard protocol during bolus administration of intravenous contrast. Multiplanar CT image reconstructions and MIPs were obtained to evaluate the vascular anatomy. RADIATION DOSE  REDUCTION: This exam was performed according to the departmental dose-optimization program which includes automated exposure control, adjustment of the mA and/or kV according to patient size and/or use of iterative reconstruction technique. CONTRAST:  OMNIPAQUE IOHEXOL 350 MG/ML SOLN COMPARISON:  CT angio chest 01/20/2012 FINDINGS: Cardiovascular: Satisfactory opacification of the pulmonary arteries to the segmental level. No evidence of pulmonary embolism. Normal heart size. No pericardial effusion. Mediastinum/Nodes: New soft tissue density is noted within the anterior superior mediastinum between the sternum and the great vessels (series 4, image 28). There is some extension of the soft tissue into the left superior mediastinum to the level of the AP window (series 4, image 41). The soft tissue is distinctly separate from the thyroid. Multiple enlarged mediastinal and hilar nodes are noted including: -1.3 cm short axis right pretracheal node (series 4, image 44) -1.0 cm short axis node at the AP window (series 4, image 44) -1.6 cm right hilar node (series 4, image 59) -1.4 cm left hilar node (series 4, image 66) No axillary lymphadenopathy. Partially visualized thyroid appears diffusely enlarged compared to prior exam on 01/20/2012. The trachea and esophagus are unremarkable. Lungs/Pleura: Linear subsegmental atelectasis noted in the right lung. Left lung is clear. No pleural effusion or pneumothorax. Upper Abdomen: No acute abnormality. Musculoskeletal: No chest wall abnormality. No acute or significant osseous findings. Review of the MIP images confirms the above findings. IMPRESSION: 1. No pulmonary embolism. 2. Subsegmental atelectasis in the right lung. The lungs are otherwise clear. 3. New soft tissue density in the anterior superior mediastinum, incompletely evaluated on this arterial phase exam. Also noted is diffuse enlargement of the thyroid gland. Findings could represent thymic hyperplasia in the  setting of Graves' disease. Recommend correlation with thyroid panel. 4. Mediastinal and bilateral hilar lymphadenopathy with new soft tissue density in the anterior superior mediastinum also raises differential consideration of malignancy, including lymphoma. Electronically Signed   By: Sherron Ales M.D.   On: 09/02/2021 14:50  ? ?CT ABDOMEN PELVIS W CONTRAST ? ?Result Date: 09/04/2021 ?CLINICAL DATA:  Hematologic malignancy, staging EXAM: CT ABDOMEN AND PELVIS WITH CONTRAST TECHNIQUE: Multidetector CT imaging of the abdomen and pelvis was performed using the standard protocol following bolus administration of intravenous contrast. RADIATION DOSE REDUCTION: This exam was performed according to the departmental dose-optimization program which includes automated exposure control, adjustment of the mA and/or kV according to patient size and/or use of iterative reconstruction technique. CONTRAST:  OMNIPAQUE IOHEXOL 300 MG/ML  SOLN COMPARISON:  None. FINDINGS: Lower chest: No acute abnormality Hepatobiliary: No focal liver abnormality is  seen. Status post cholecystectomy. No biliary dilatation. Pancreas: No focal abnormality or ductal dilatation. Spleen: No focal abnormality.  Normal size. Adrenals/Urinary Tract: No adrenal abnormality. No focal renal abnormality. No stones or hydronephrosis. Urinary bladder is unremarkable. Stomach/Bowel: Stomach, large and small bowel grossly unremarkable. Normal appendix. Vascular/Lymphatic: No evidence of aneurysm or adenopathy. Small bilateral inguinal lymph nodes, none pathologically enlarged. Largest lymph node has a short axis diameter of 6 mm. Reproductive: Uterus and adnexa unremarkable.  No mass. Other: No free fluid or free air. Musculoskeletal: No acute bony abnormality. IMPRESSION: No acute findings or significant abnormality in the abdomen or pelvis. Electronically Signed   By: Charlett NoseKevin  Dover M.D.   On: 09/04/2021 02:34  ? ?US Venous Img Lower Unilateral Left ? ?Result  Date: 09/02/2021 ?CLINICAL DATA:  Swelling x3 weeks EXAM: LEFT LOWER EXTREMITY VENOUS DOPPLER ULTRASOUND TECHNIQUE: Gray-scale sonography with compression, as well as color and duplex ultrasound, were pe

## 2021-09-04 NOTE — TOC Progression Note (Signed)
Transition of Care (TOC) - Progression Note  ? ? ?Patient Details  ?Name: Pamela [name] ?MRN: [id number] ?Date of Birth: 01-28-1986 ? ?Transition of Care (TOC) CM/SW Contact  ?Leone Haven, RN ?Phone Number: ?09/04/2021, 11:29 AM ? ?Clinical Narrative:    ?from home, indep, new HF onset, conts on IV lasix, on room air.  TOC will continue to follow for dc needs. ? ? ?  ?  ? ?Expected Discharge Plan and Services ?  ?  ?  ?  ?  ?                ?  ?  ?  ?  ?  ?  ?  ?  ?  ?  ? ? ?Social Determinants of Health (SDOH) Interventions ?  ? ?Readmission Risk Interventions ?   ? View : No data to display.  ?  ?  ?  ? ? ?

## 2021-09-04 NOTE — Progress Notes (Signed)
Mobility Specialist Progress Note: ? ? 09/04/21 1150  ?Mobility  ?Activity Ambulated independently in hallway  ?Level of Assistance Independent  ?Assistive Device None  ?Distance Ambulated (ft) 1500 ft  ?Activity Response Tolerated well  ?$Mobility charge 1 Mobility  ? ?Pt eager for mobility session. SpO2 >90% on RA throughout. Pt back in bed with all needs met.  ? ?N[facility] Numbers ?Acute Rehab ?Phone: [phone] ?Office Phone: [phone] ? ?

## 2021-09-04 NOTE — Progress Notes (Signed)
?PROGRESS NOTE ? ? ? ?SION BEEKER  [id number] DOB: 1986/02/20 DOA: 09/02/2021 ?PCP: [facility]  ? ? ?Chief Complaint  ?Patient presents with  ? Chest Pain  ? ? ?Brief Narrative:  ?Patient pleasant 36 year old female history of depression, anxiety, genital herpes, gonorrhea, class III obesity presents to the ED with a 2-week history of shortness of breath, lower extremity edema, palpitations.  Patient noted to be new onset CHF exacerbation.  BNP elevated.  COVID-19 influenza PCR negative.  Lower extremity Dopplers that were negative.  CT angiogram chest negative for PE with some subsegmental atelectasis in the right lung.  Mediastinal bilateral hilar lymphadenopathy noted with new soft tissue density in the anterior mediastinum.  Patient also noted to be hyperthyroid.  Patient placed on IV Lasix.  Cardiology consulted.  ? ? ?Assessment & Plan: ? Principal Problem: ?  Dyspnea ?Active Problems: ?  Acute diastolic CHF (congestive heart failure) ([place]) ?  Mediastinal mass ?  Thyroid enlargement ?  Hyperthyroidism ?  Chest pain ?  Normocytic anemia ?  Acute exacerbation of CHF (congestive heart failure) ([place]) ?  Acute pulmonary edema (HCC) ?  Tachycardia ?  Hypomagnesemia ?  Lymphadenopathy ? ? ? ?Assessment and Plan: ?* Dyspnea ?Secondary to new onset acute diastolic CHF . ?-Patient with elevated BNP ?Patient presented with complaints of bilateral ankle swelling, dyspnea on exertion, and orthopnea.  BNP elevated concerning for possible new onset CHF.  ?- CT without evidence of pulmonary emboli.. ?-Cardiac monitoring. ?-2D echo with a EF of 60 to 65%,NWMA, mild LVH. ?-Patient was given IV Lasix 40 mg in the ED with a urine output of 1.350 L. ?-Patient currently on Lasix 40 mg IV every 12 hours with urine output of 5.6 L over the past 24 hours. ?-Patient is -5.886 L during his hospitalization. ?-Current weight of 292.8 pounds from 306.99 pounds on admission. ?-Continue IV Lasix,  beta-blocker. ?-Patient seen in consultation by cardiology who are following. ? ?Acute diastolic CHF (congestive heart failure) ([place]) ?- Patient presented with new onset acute diastolic CHF. ?-Patient presented with lower extremity edema, orthopnea, dyspnea on exertion, PND, noted to have elevated BNP. ?-Chest x-ray done concerning for pulmonary vascular congestion. ?-CT angiogram chest done negative for PE. ?-Patient noted to be tachycardic on admission and work-up also concerning for hyperthyroidism. ?-2D echo done with a EF of 60 to 65%,NWMA, mild LVH. ?-Troponin negative. ?-EKG with a sinus tachycardia. ?-Patient received Lasix 40 mg IV x1 in the ED with urine output of 1.350 L. ?-Patient currently on Lasix 40 mg IV every 12 hours with urine output of 5.6 L over the past 24 hours. ?-Patient is -5.866 L during this hospitalization. ?-Current weight of 292.8 pounds from 3 of 6.99 pounds on admission. ?-2D echo with a EF of 60 to 65%,NWMA, mild LVH ?-Patient with clinical improvement. ?-Continue Lasix 40 mg IV every 12 hours. ?-Continue propranolol secondary to hypothyroidism ?-Strict I's and O's, daily weights. ?-Due to new onset CHF, cardiology was consulted who are following.  ? ?Hyperthyroidism ?- TSH of 0.001.,  Free T4 >5.50.  T3 elevated at 17.5.Marland Kitchen ?-Thyrotropin receptor antibodies elevated at 19.40 ?-Thyroid ultrasound with thyromegaly with heterogeneous hyperemic parenchyma, no nodule or other indication for biopsy or further imaging evaluation. ?-CT chest with mediastinal and bilateral hilar adenopathy with new soft tissue density in the anterior superior mediastinum with differential concerning for thymic mass versus lymphoma. ?-Increase propranolol to 20 mg 3 times daily.   ?-Increase methimazole to 10 mg  daily.   ?-Case discussed with endocrinology, Dr. Sharl Ma who feels likely secondary to [facility]' disease as patient also with family history.   ?-Outpatient follow-up with Dr. Sharl Ma, endocrinology.    ?-Please see thyroid enlargement above.  ? ? ?Thyroid enlargement ?CT showing diffuse enlargement of the thyroid gland.  TSH very low at 0.001 and free T4 elevated >5.50 concerning for hyperthyroidism..  Differentials include Graves' disease versus toxic multinodular goiter. ?-Patient does endorse a family history of Graves' disease in her father. ?-Thyroid ultrasound done with thyromegaly with heterogeneous hyperemic parenchyma, no nodule or other indication for biopsy or further imaging evaluation. ?-Free T3 level elevated at 17.5 ?-Thyrotropin receptor antibodies elevated at 19.40. ?-Increase propranolol 20 mg 3 times daily, ?-Increase methimazole to 10 mg daily.  ?-Case discussed with Dr. Sharl Ma of endocrinology who is in agreement with current recommendations and will be delighted to see patient in the outpatient setting postdischarge for further management. ? ?Mediastinal mass ?Patient is endorsing subjective fevers.   ?-Denied night sweats or weight loss. CT showing mediastinal and bilateral hilar lymphadenopathy with a new soft tissue density in the anterior superior mediastinum.    Differentials include thymic mass versus lymphoma.   ?-Antiacetylcholine receptor antibodies pending. ?-LDH within normal limits ?-AFP ?-Beta-hCG < 1. ?-Free T4 elevated 17.5, thyrotropin receptor antibodies elevated at 19.40. ?-Patient with family history of Graves' disease. ?-Case discussed with endocrinology Dr. 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 ?-Patient seen in consultation by hematology/oncology who recommended evaluation of CT abdomen and pelvis which was negative for any further adenopathy and recommended evaluation by CT surgery. ?-Patient seen in consultation by CT surgery, Dr. Cliffton Asters who  recommended outpatient follow-up. ?-Supportive care. ? ? ?Normocytic anemia ?Hemoglobin 10.0, was 10.4 on 07/21/2021 (Care Everywhere).  Not endorsing any symptoms of active bleeding. ?-Likely iron deficiency anemia in menstruating female. ?-Anemia panel with iron level of 35, TIBC of 248, ferritin of 286, folate of 19.6. ?-Hemoglobin stable at 10.3. ?-Transfusion threshold hemoglobin < 8.  ?-Outpatient follow-up. ? ?Chest pain ?ACS less likely as high-sensitivity troponin negative x2. ?-Likely secondary to acute CHF exacerbation ?- CT angiogram chest negative for PE.  Currently chest pain-free and appears comfortable. ?-Cardiac monitoring ?-Cardiology following. ? ?Hypomagnesemia ?- Magnesium at 1.8 from 1.5.  Magnesium level 1.8.  -Magnesium sulfate 2 g IV x1.   ?-Repeat labs in the a.m. ? ?Tachycardia ?- Likely secondary to hyperthyroidism. ?-Increase propranolol to 20 mg 3 times daily. ?-Follow. ? ? ? ? ?  ? ? ?DVT prophylaxis: Lovenox ?Code Status: Full ?Family Communication: Updated patient and sister at bedside. ?Disposition: Home when clinically improved. ? ?Status is: Inpatient ?The patient will require care spanning > 2 midnights and should be moved to inpatient because: Severity of illness. ?  ?Consultants:  ?Cardiology: Dr. Servando Salina 09/03/2021 ?CT surgery: Dr. Cliffton Asters 09/04/2020 ?Hematology/oncology: Dr. Arlis Porta 09/04/2020 ?Curb sided endocrinology via telephone: Dr. Sharl Ma 09/04/2021 ? ?Procedures:  ?2D echo 09/03/2021 ?Thyroid ultrasound 09/02/2021 ?CT angiogram chest 09/02/2021 ?CT abdomen and pelvis 09/04/2021 ? ? ? ?Antimicrobials:  ?None ? ? ?Subjective: ?Sitting up in bed just came back from ambulating in the hallway.  Feels shortness of breath is improving.  Chest tightness improving.  Lower extremity edema improving.  Overall feeling much better than she did when she presented to the  ED.  States good urine output.  States she just spoke with CT surgeon, Dr. Kipp Brood who recommended outpatient  follow-up ? ?Objective: ?Vitals:  ? 09/03/21 2019 09/04/21 0419 09/04/21 0420 09/04/21 1052  ?BP: (!) 141/60 (!) 151/71  (!) 142/59  ?Pulse: (!) 104 (!) 101 100 95  ?Resp: 20 20 20 20   ?Temp: 98.7 ?F (37.1 ?C) 98.1 ?F (36.7 ?C)  98.2 ?F (36.

## 2021-09-05 ENCOUNTER — Other Ambulatory Visit (HOSPITAL_COMMUNITY): Payer: Self-pay

## 2021-09-05 DIAGNOSIS — J9859 Other diseases of mediastinum, not elsewhere classified: Secondary | ICD-10-CM | POA: Diagnosis not present

## 2021-09-05 DIAGNOSIS — D649 Anemia, unspecified: Secondary | ICD-10-CM | POA: Diagnosis not present

## 2021-09-05 DIAGNOSIS — E0500 Thyrotoxicosis with diffuse goiter without thyrotoxic crisis or storm: Secondary | ICD-10-CM

## 2021-09-05 DIAGNOSIS — I5031 Acute diastolic (congestive) heart failure: Secondary | ICD-10-CM | POA: Diagnosis not present

## 2021-09-05 LAB — COMPREHENSIVE METABOLIC PANEL
ALT: 38 U/L (ref 0–44)
AST: 34 U/L (ref 15–41)
Albumin: 3.2 g/dL — ABNORMAL LOW (ref 3.5–5.0)
Alkaline Phosphatase: 53 U/L (ref 38–126)
Anion gap: 10 (ref 5–15)
BUN: 11 mg/dL (ref 6–20)
CO2: 24 mmol/L (ref 22–32)
Calcium: 10.1 mg/dL (ref 8.9–10.3)
Chloride: 101 mmol/L (ref 98–111)
Creatinine, Ser: 0.38 mg/dL — ABNORMAL LOW (ref 0.44–1.00)
GFR, Estimated: >60 mL/min (ref >60)
Glucose, Bld: 92 mg/dL (ref 70–99)
Potassium: 4 mmol/L (ref 3.5–5.1)
Sodium: 135 mmol/L (ref 135–145)
Total Bilirubin: 0.7 mg/dL (ref 0.3–1.2)
Total Protein: 6.5 g/dL (ref 6.5–8.1)

## 2021-09-05 LAB — CBC
HCT: 34 % — ABNORMAL LOW (ref 36.0–46.0)
Hemoglobin: 11 g/dL — ABNORMAL LOW (ref 12.0–15.0)
MCH: 26.4 pg (ref 26.0–34.0)
MCHC: 32.4 g/dL (ref 30.0–36.0)
MCV: 81.7 fL (ref 80.0–100.0)
Platelets: 303 10*3/uL (ref 150–400)
RBC: 4.16 MIL/uL (ref 3.87–5.11)
RDW: 13.4 % (ref 11.5–15.5)
WBC: 9.5 10*3/uL (ref 4.0–10.5)
nRBC: 0 % (ref 0.0–0.2)

## 2021-09-05 LAB — MAGNESIUM: Magnesium: 1.7 mg/dL (ref 1.7–2.4)

## 2021-09-05 MED ORDER — DAPAGLIFLOZIN PROPANEDIOL 10 MG PO TABS
ORAL_TABLET | ORAL | 0 refills | Status: DC
  Filled 2021-09-05: qty 30, 30d supply, fill #0

## 2021-09-05 MED ORDER — POTASSIUM CHLORIDE CRYS ER 20 MEQ PO TBCR
20.0000 meq | EXTENDED_RELEASE_TABLET | Freq: Once | ORAL | Status: AC
Start: 2021-09-05 — End: 2021-09-05
  Administered 2021-09-05: 20 meq via ORAL
  Filled 2021-09-05: qty 1

## 2021-09-05 MED ORDER — GUAIFENESIN-DM 100-10 MG/5ML PO SYRP
5.0000 mL | ORAL_SOLUTION | ORAL | Status: DC | PRN
  Administered 2021-09-05 – 2021-09-06 (×3): 5 mL via ORAL
  Filled 2021-09-05 (×3): qty 5

## 2021-09-05 MED ORDER — FUROSEMIDE 40 MG PO TABS
40.0000 mg | ORAL_TABLET | Freq: Every day | ORAL | Status: DC
  Administered 2021-09-06: 40 mg via ORAL
  Filled 2021-09-05: qty 1

## 2021-09-05 MED ORDER — SACUBITRIL-VALSARTAN 24-26 MG PO TABS
ORAL_TABLET | ORAL | 0 refills | Status: DC
  Filled 2021-09-05: qty 60, 30d supply, fill #0

## 2021-09-05 NOTE — TOC Progression Note (Signed)
Transition of Care (TOC) - Progression Note  ? ? ?Patient Details  ?Name: Pamela [name] ?MRN: [id number] ?Date of Birth: [birth year] ? ?Transition of Care (TOC) CM/SW Contact  ?Leone Haven, RN ?Phone Number: ?09/05/2021, 4:09 PM ? ?Clinical Narrative:    ?Will change IV lasix to po today.  TOC will continue to follow for dc needs. ? ? ?  ?  ? ?Expected Discharge Plan and Services ?  ?  ?  ?  ?  ?                ?  ?  ?  ?  ?  ?  ?  ?  ?  ?  ? ? ?Social Determinants of Health (SDOH) Interventions ?Food Insecurity Interventions: Intervention Not Indicated ?Financial Strain Interventions: Intervention Not Indicated ?Housing Interventions: Intervention Not Indicated ?Transportation Interventions: Intervention Not Indicated ? ?Readmission Risk Interventions ?   ? View : No data to display.  ?  ?  ?  ? ? ?

## 2021-09-05 NOTE — Progress Notes (Signed)
?Progress Note ? ? ?Patient: Pamela [name] IFO:[id number] DOB: December 28, 1985 DOA: 09/02/2021     2 ?DOS: the patient was seen and examined on 09/05/2021 ?  ?Brief hospital course: ?Pamela [name] was admitted to the hospital with the working diagnosis of decompensated heart failure.  ? ?36 year old female with past medial history of depression, anxiety, genital herpes, gonorrhea, class III obesity presents to the ED with a 2-week history of shortness of breath, lower extremity edema, palpitations and orthopnea. On her initial physical examination her blood pressure was 137/73, HR 116, RR 25, 02 saturation 98%, heart with S1 and S2 present and tachycardic, lungs with no wheezing or rales, abdomen not distended and positive lower extremity edema. ? ?Na 139, K 3,6 Cl 104, bicarbonate 22, glucose 83, bun 9 cr <0,30 ?BNP 467 ?High sensitive troponin 7  ?Wbc 7.1 hgb 10,0, hct 31,5 plt 266  ?TSH 001 free T4 >5,5  ? ?Chest radiograph with bilateral hilar vascular congestion.  ? ?EKG 117 bpm, normal axis, normal intervals, sinus rhythm with no significant ST segment or T wave changes.  ? ?CT chest with no pulmonary embolism, faint bilateral grouns glass opacities, new soft tissue density in the anterior mediastinum. Diffuse enlargement of the thyroid gland. Mediastinal and bilateral hilar lymphadenopathy.  ? ?Patient was placed on furosemide for diuresis.  ?Echocardiogram with preserved LV systolic function  ?Positive thyrotropin receptor antibodies. ? ?Patient has been placed on methimazole and propranolol. ? ? ?Assessment and Plan: ?* Acute diastolic heart failure (HCC) ?Echocardiogram with EF 60 to 65%, with no wall motion abnormalities. RV with preserved systolic function, no significant valvular disease.  ? ?High output heart failure due to thyrotoxicosis.  ? ?Urine output over last 24 hrs is 2,650 ml.  ?Systolic blood pressure 104 to 133 mmHg.  ? ?Plan to continue diuresis with furosemide.  ?B blockade with metoprolol.  ? ?Graves  disease ?Thyroid ultrasound done with thyromegaly with heterogeneous hyperemic parenchyma, no nodule or other indication for biopsy or further imaging evaluation. ?-Free T3 level elevated at 17.5 ?-Thyrotropin receptor antibodies elevated at 19.40. ? ?Continue with methimazole and b blocker. ?Plan to follow up as outpatient.  ? ?Normocytic anemia ?Hemoglobin 10.0, was 10.4 on 07/21/2021 (Care Everywhere).  Not endorsing any symptoms of active bleeding. ?-Likely iron deficiency anemia in menstruating female. ?-Anemia panel with iron level of 35, TIBC of 248, ferritin of 286, folate of 19.6. ?-Hemoglobin stable at 10.3. ?-Transfusion threshold hemoglobin < 8.  ?-Outpatient follow-up. ? ?Mediastinal mass ?Case discussed with endocrinology Dr. 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 ?-Patient seen in consultation by hematology/oncology who recommended evaluation of CT abdomen and pelvis which was negative for any further adenopathy and recommended evaluation by CT surgery. ?-Patient seen in consultation by CT surgery, Dr. Cliffton Asters who recommended outpatient follow-up. ?-Supportive care. ? ?Plan to follow up as outpatient.  ? ?Hypomagnesemia ?Renal function with serum cr at 0,38, K is 4,0 and serum bicarbonate aat 24. ?Mg is 1,7 ?Plan to add 20 meq Kcl daily to prevent hypokalemia.  ? ?Class 3 obesity (HCC) ?Calculated BMI is 41.5  ? ?Tachycardia-resolved as of 09/05/2021 ?- Likely secondary to hyperthyroidism. ?-Increase propranolol to 20 mg 3 times daily. ?-Follow. ? ? ? ? ?  ? ?Subjective: Patient is feeling better but continue to have  dyspnea on exertion, edema has improved  ? ?Physical Exam: ?Vitals:  ? 09/04/21 1957 09/05/21 0407 09/05/21 0911 09/05/21 1110  ?BP: 137/66 104/79 (!) 133/50 (!)  125/45  ?Pulse: 95 100  90  ?Resp: 20 20 20 14   ?Temp: (!) 97.5 ?F (36.4 ?C) 98.4 ?F (36.9 ?C)  98 ?F (36.7 ?C)  ?TempSrc: Oral Oral  Oral  ?SpO2: 97% 95%  97%  ?Weight:  131.4 kg    ?Height:      ? ?Neurology awake and alert ?ENT with no pallor ?Cardiovascular with S1 and S2 present and tachycardiac with no rubs, gallops or murmurs ?No JVd ?Trace lower extremity edema ?Respiratory with no rales or wheezing ?Abdomen not distended  ?Data Reviewed: ? ? ? ?Family Communication: no family at the bedside  ? ?Disposition: ?Status is: Inpatient ?Remains inpatient appropriate because: heart failure possible dc home tomorrow ? Planned Discharge Destination: Home ? ?Author: ? , MD ?09/05/2021 5:34 PM ? ?For on call review [email or web address].  ?

## 2021-09-05 NOTE — Progress Notes (Signed)
Heart Failure Stewardship Pharmacist Progress Note ? ? ?PCP: Health, [facility] ?PCP-Cardiologist: None  ? ? ?HPI:  ?36 yo F with PMH of depression, anxiety, genital herpes, and morbid obesity. She presented to the ED on 4/1 with chest pain and palpitations. CXR with pulmonary vascular congestion. An ECHO was done on 4/3 and LVEF was 60-65%. ? ?Current HF Medications: ?Diuretic: furosemide 40 mg IV BID ? ?Prior to admission HF Medications: ?None ? ?Pertinent Lab Values: ?Serum creatinine 0.38, BUN 11, Potassium 4.0, Sodium 135, BNP 467.4, Magnesium 1.7, A1c 6.0  ? ?Vital Signs: ?Weight: 289 lbs (admission weight: 301 lbs) ?Blood pressure: 110-130/50-70s  ?Heart rate: 90-100s  ?I/O: -2.3L yesterday; net -7.1L ? ?Medication Assistance / Insurance Benefits Check: ?Does the patient have prescription insurance?  Yes ?Type of insurance plan: Managed Medicaid ? ?Outpatient Pharmacy:  ?[facility] ?Is the patient willing to use [facility] TOC pharmacy at discharge? Yes ?Is the patient willing to transition their outpatient pharmacy to utilize a [facility] outpatient pharmacy?   Pending ?  ? ?Assessment: ?1. Acute diastolic CHF (EF 69-62%). NYHA class III symptoms. ?- Continue furosemide 40 mg IV BID ?- Consider starting Entresto 24/26 mg BID for additional BP lowering with HFpEF ?- Consider adding Farxiga 10 mg daily ?  ?Plan: ?1) Medication changes recommended at this time: ?- Add Farxiga 10 mg daily OR add Entresto 24/26 mg BID ? ?2) Patient assistance: ?- Entresto $4 ?- Farxiga $4 ? ?3)  Education  ?- To be completed prior to discharge ? ?Sharen Hones, PharmD, BCPS ?Heart Failure Stewardship Pharmacist ?Phone [phone] ? ? ?

## 2021-09-05 NOTE — Assessment & Plan Note (Signed)
Calculated BMI is 41.5  

## 2021-09-05 NOTE — Progress Notes (Addendum)
Mobility Specialist Progress Note: ? ? 09/05/21 1010  ?Mobility  ?Activity Ambulated independently in hallway  ?Level of Assistance Independent  ?Assistive Device None  ?Distance Ambulated (ft) 1500 ft  ?Activity Response Tolerated well  ?$Mobility charge 1 Mobility  ? ?Pt asx during ambulation on RA. SpO2 99% throughout, RR up to 40s. Pt back in bed with all needs met.  ? ?Nelta Numbers ?Acute Rehab ?Phone: [phone] ?Office Phone: [phone] ? ?

## 2021-09-05 NOTE — Progress Notes (Signed)
? ?Progress Note ? ?Patient Name: Pamela [name] ?Date of Encounter: 09/05/2021 ? ?Primary Cardiologist: None  ? ?Subjective  ? ?Patient seen and examined at bedside.  She is awake and arrived.  Shortness of breath has improved significantly.  Reports chest tightness. ? ? ?Inpatient Medications  ?  ?Scheduled Meds: ? enoxaparin (LOVENOX) injection  40 mg Subcutaneous Q24H  ? furosemide  40 mg Intravenous Q12H  ? methimazole  10 mg Oral Daily  ? propranolol  20 mg Oral TID  ? ?Continuous Infusions: ? sodium chloride Stopped (09/03/21 1900)  ? ?PRN Meds: ?sodium chloride, acetaminophen  ? ?Vital Signs  ?  ?Vitals:  ? 09/04/21 0420 09/04/21 1052 09/04/21 1957 09/05/21 0407  ?BP:  (!) 142/59 137/66 104/79  ?Pulse: 100 95 95 100  ?Resp: 20 20 20 20   ?Temp:  98.2 ?F (36.8 ?C) (!) 97.5 ?F (36.4 ?C) 98.4 ?F (36.9 ?C)  ?TempSrc:  Oral Oral Oral  ?SpO2:  95% 97% 95%  ?Weight:    131.4 kg  ?Height:      ? ? ?Intake/Output Summary (Last 24 hours) at 09/05/2021 0910 ?Last data filed at 09/05/2021 11/05/2021 ?Gross per 24 hour  ?Intake 1080 ml  ?Output 2651 ml  ?Net -1571 ml  ? ?Filed Weights  ? 09/03/21 0518 09/04/21 0419 09/05/21 0407  ?Weight: 135.7 kg 132.8 kg 131.4 kg  ? ? ?Telemetry  ?  ?Sinus tachycardia- Personally Reviewed ? ?ECG  ?  ?None- Personally Reviewed ? ?Physical Exam  ?  ?General: Comfortable ?Head: Atraumatic, normal size  ?Eyes: PEERLA, EOMI  ?Neck: Supple, normal JVD ?Cardiac: Normal S1, S2; RRR; no murmurs, rubs, or gallops ?Lungs: Clear to auscultation bilaterally ?Abd: Soft, nontender, no hepatomegaly  ?Ext: warm, edema improved significantly bilateral trace ?Musculoskeletal: No deformities, BUE and BLE strength normal and equal ?Skin: Warm and dry, no rashes   ?Neuro: Alert and oriented to person, place, time, and situation, CNII-XII grossly intact, no focal deficits  ?Psych: Normal mood and affect  ? ?Labs  ?  ?Chemistry ?Recent Labs  ?Lab 09/03/21 ?11/03/21 09/04/21 ?11/04/21 09/05/21 ?0356  ?NA 137 137 135  ?K 3.7 3.9 4.0   ?CL 102 104 101  ?CO2 27 25 24   ?GLUCOSE 120* 90 92  ?BUN 5* 9 11  ?CREATININE 0.38* 0.38* 0.38*  ?CALCIUM 10.0 9.8 10.1  ?PROT 6.5 6.4* 6.5  ?ALBUMIN 3.3* 3.2* 3.2*  ?AST 58* 46* 34  ?ALT 52* 46* 38  ?ALKPHOS 62 56 53  ?BILITOT 0.7 0.4 0.7  ?GFRNONAA >60 >60 >60  ?ANIONGAP 8 8 10   ?  ? ?Hematology ?Recent Labs  ?Lab 09/03/21 ? 09/04/21 ?11/03/21 09/05/21 ?0356  ?WBC 5.4 8.0 9.5  ?RBC 3.98 3.90 4.16  ?HGB 10.4* 10.3* 11.0*  ?HCT 31.9* 31.4* 34.0*  ?MCV 80.2 80.5 81.7  ?MCH 26.1 26.4 26.4  ?MCHC 32.6 32.8 32.4  ?RDW 13.4 13.3 13.4  ?PLT 273 275 303  ? ? ?Cardiac EnzymesNo results for input(s): TROPONINI in the last 168 hours. No results for input(s): TROPIPOC in the last 168 hours.  ? ?BNP ?Recent Labs  ?Lab 09/02/21 ?1329 09/03/21 ?11/05/21  ?BNP 467.4* 318.2*  ?  ? ?DDimer No results for input(s): DDIMER in the last 168 hours.  ? ?Radiology  ?  ?CT ABDOMEN PELVIS W CONTRAST ? ?Result Date: 09/04/2021 ?CLINICAL DATA:  Hematologic malignancy, staging EXAM: CT ABDOMEN AND PELVIS WITH CONTRAST TECHNIQUE: Multidetector CT imaging of the abdomen and pelvis was performed using the standard protocol following bolus  administration of intravenous contrast. RADIATION DOSE REDUCTION: This exam was performed according to the departmental dose-optimization program which includes automated exposure control, adjustment of the mA and/or kV according to patient size and/or use of iterative reconstruction technique. CONTRAST:  100mL OMNIPAQUE IOHEXOL 300 MG/ML  SOLN COMPARISON:  None. FINDINGS: Lower chest: No acute abnormality Hepatobiliary: No focal liver abnormality is seen. Status post cholecystectomy. No biliary dilatation. Pancreas: No focal abnormality or ductal dilatation. Spleen: No focal abnormality.  Normal size. Adrenals/Urinary Tract: No adrenal abnormality. No focal renal abnormality. No stones or hydronephrosis. Urinary bladder is unremarkable. Stomach/Bowel: Stomach, large and small bowel grossly unremarkable. Normal  appendix. Vascular/Lymphatic: No evidence of aneurysm or adenopathy. Small bilateral inguinal lymph nodes, none pathologically enlarged. Largest lymph node has a short axis diameter of 6 mm. Reproductive: Uterus and adnexa unremarkable.  No mass. Other: No free fluid or free air. Musculoskeletal: No acute bony abnormality. IMPRESSION: No acute findings or significant abnormality in the abdomen or pelvis. Electronically Signed   By: Charlett NoseKevin  Dover M.D.   On: 09/04/2021 02:34  ? ?ECHOCARDIOGRAM COMPLETE ? ?Result Date: 09/03/2021 ?   ECHOCARDIOGRAM REPORT   Patient Name:   Pamela NottinghamJAMIE L [name] Date of Exam: 09/03/2021 Medical Rec #:  [id number]    Height:       70.0 in Accession #:    [id number]   Weight:       299.1 lb Date of Birth:  [birth year]   BSA:          2.477 m? Patient Age:    36 years     BP:           144/76 mmHg Patient Gender: F            HR:           97 bpm. Exam Location:  Inpatient Procedure: 2D Echo, 3D Echo, Color Doppler and Cardiac Doppler Indications:    Elevated BNP  History:        Patient has no prior history of Echocardiogram examinations.  Sonographer:    Irving BurtonEmily Senior RDCS Referring Phys: [id number] VASUNDHRA RATHORE IMPRESSIONS  1. Left ventricular ejection fraction, by estimation, is 60 to 65%. The left ventricle has normal function. The left ventricle has no regional wall motion abnormalities. There is mild left ventricular hypertrophy. Left ventricular diastolic parameters were normal.  2. Right ventricular systolic function is normal. The right ventricular size is normal.  3. The mitral valve is normal in structure. Trivial mitral valve regurgitation. No evidence of mitral stenosis.  4. The aortic valve is tricuspid. Aortic valve regurgitation is not visualized. No aortic stenosis is present.  5. The inferior vena cava is normal in size with greater than 50% respiratory variability, suggesting right atrial pressure of 3 mmHg. Comparison(s): No prior Echocardiogram. FINDINGS  Left Ventricle: Left  ventricular ejection fraction, by estimation, is 60 to 65%. The left ventricle has normal function. The left ventricle has no regional wall motion abnormalities. The left ventricular internal cavity size was normal in size. There is  mild left ventricular hypertrophy. Left ventricular diastolic parameters were normal. Right Ventricle: The right ventricular size is normal. Right ventricular systolic function is normal. Left Atrium: Left atrial size was normal in size. Right Atrium: Right atrial size was normal in size. Pericardium: There is no evidence of pericardial effusion. Mitral Valve: The mitral valve is normal in structure. Mild mitral annular calcification. Trivial mitral valve regurgitation. No evidence of mitral valve stenosis. Tricuspid Valve: The tricuspid  valve is normal in structure. Tricuspid valve regurgitation is trivial. No evidence of tricuspid stenosis. Aortic Valve: The aortic valve is tricuspid. Aortic valve regurgitation is not visualized. No aortic stenosis is present. Pulmonic Valve: The pulmonic valve was normal in structure. Pulmonic valve regurgitation is not visualized. No evidence of pulmonic stenosis. Aorta: The aortic root is normal in size and structure. Venous: The inferior vena cava is normal in size with greater than 50% respiratory variability, suggesting right atrial pressure of 3 mmHg. IAS/Shunts: The interatrial septum was not well visualized.  LEFT VENTRICLE PLAX 2D LVIDd:         4.60 cm      Diastology LVIDs:         3.60 cm      LV e' medial:    8.70 cm/s LV PW:         1.10 cm      LV E/e' medial:  12.1 LV IVS:        1.20 cm      LV e' lateral:   9.25 cm/s LVOT diam:     1.90 cm      LV E/e' lateral: 11.4 LV SV:         61 LV SV Index:   24 LVOT Area:     2.84 cm?                              3D Volume EF: LV Volumes (MOD)            3D EF:        45 % LV vol d, MOD A2C: 115.0 ml LV EDV:       197 ml LV vol d, MOD A4C: 132.0 ml LV ESV:       109 ml LV vol s, MOD A2C: 56.7  ml  LV SV:        88 ml LV vol s, MOD A4C: 68.3 ml LV SV MOD A2C:     58.3 ml LV SV MOD A4C:     132.0 ml LV SV MOD BP:      61.9 ml RIGHT VENTRICLE RV S prime:     13.60 cm/s TAPSE (M-mode): 2.2 cm LEFT ATRIU

## 2021-09-05 NOTE — Progress Notes (Signed)
Mobility Specialist Progress Note: ? ? 09/05/21 1500  ?Mobility  ?Activity Ambulated independently in hallway  ?Level of Assistance Independent  ?Assistive Device None  ?Distance Ambulated (ft) 1500 ft  ?Activity Response Tolerated well  ?$Mobility charge 1 Mobility  ? ?Pt asx during ambulation. Back in room with all needs met.  ? ?Nelta Numbers ?Acute Rehab ?Phone: [phone] ?Office Phone: [phone] ? ?

## 2021-09-05 NOTE — Plan of Care (Signed)
?  Problem: Education: ?Goal: Ability to demonstrate management of disease process will improve ?Outcome: Progressing ?  ?

## 2021-09-06 ENCOUNTER — Other Ambulatory Visit (HOSPITAL_COMMUNITY): Payer: Self-pay

## 2021-09-06 DIAGNOSIS — E0500 Thyrotoxicosis with diffuse goiter without thyrotoxic crisis or storm: Secondary | ICD-10-CM | POA: Diagnosis not present

## 2021-09-06 DIAGNOSIS — D649 Anemia, unspecified: Secondary | ICD-10-CM | POA: Diagnosis not present

## 2021-09-06 DIAGNOSIS — J9859 Other diseases of mediastinum, not elsewhere classified: Secondary | ICD-10-CM | POA: Diagnosis not present

## 2021-09-06 DIAGNOSIS — I5031 Acute diastolic (congestive) heart failure: Secondary | ICD-10-CM | POA: Diagnosis not present

## 2021-09-06 LAB — BASIC METABOLIC PANEL
Anion gap: 8 (ref 5–15)
BUN: 9 mg/dL (ref 6–20)
CO2: 25 mmol/L (ref 22–32)
Calcium: 10.2 mg/dL (ref 8.9–10.3)
Chloride: 103 mmol/L (ref 98–111)
Creatinine, Ser: 0.41 mg/dL — ABNORMAL LOW (ref 0.44–1.00)
GFR, Estimated: >60 mL/min (ref >60)
Glucose, Bld: 104 mg/dL — ABNORMAL HIGH (ref 70–99)
Potassium: 4 mmol/L (ref 3.5–5.1)
Sodium: 136 mmol/L (ref 135–145)

## 2021-09-06 MED ORDER — POTASSIUM CHLORIDE CRYS ER 20 MEQ PO TBCR
20.0000 meq | EXTENDED_RELEASE_TABLET | Freq: Every day | ORAL | Status: DC
  Administered 2021-09-06: 20 meq via ORAL
  Filled 2021-09-06: qty 1

## 2021-09-06 MED ORDER — PROPRANOLOL HCL 20 MG PO TABS
20.0000 mg | ORAL_TABLET | Freq: Three times a day (TID) | ORAL | 0 refills | Status: DC
  Filled 2021-09-06: qty 90, 30d supply, fill #0

## 2021-09-06 MED ORDER — DAPAGLIFLOZIN PROPANEDIOL 10 MG PO TABS
10.0000 mg | ORAL_TABLET | Freq: Every day | ORAL | 0 refills | Status: DC
Start: 2021-09-06 — End: 2021-09-11
  Filled 2021-09-06: qty 30, 30d supply, fill #0

## 2021-09-06 MED ORDER — POTASSIUM CHLORIDE CRYS ER 20 MEQ PO TBCR
20.0000 meq | EXTENDED_RELEASE_TABLET | Freq: Every day | ORAL | 0 refills | Status: DC
Start: 2021-09-07 — End: 2021-09-24
  Filled 2021-09-06: qty 30, 30d supply, fill #0

## 2021-09-06 MED ORDER — IRBESARTAN 75 MG PO TABS
37.5000 mg | ORAL_TABLET | Freq: Every day | ORAL | 0 refills | Status: DC
  Filled 2021-09-06: qty 15, 30d supply, fill #0

## 2021-09-06 MED ORDER — METHIMAZOLE 10 MG PO TABS
10.0000 mg | ORAL_TABLET | Freq: Every day | ORAL | 0 refills | Status: DC
  Filled 2021-09-06: qty 20, 20d supply, fill #0

## 2021-09-06 MED ORDER — IRBESARTAN 75 MG PO TABS
37.5000 mg | ORAL_TABLET | Freq: Every day | ORAL | Status: DC
  Administered 2021-09-06: 37.5 mg via ORAL
  Filled 2021-09-06: qty 0.5

## 2021-09-06 MED ORDER — FUROSEMIDE 40 MG PO TABS
40.0000 mg | ORAL_TABLET | Freq: Every day | ORAL | 0 refills | Status: DC
  Filled 2021-09-06: qty 30, 30d supply, fill #0

## 2021-09-06 NOTE — TOC Transition Note (Addendum)
Transition of Care (TOC) - CM/SW Discharge Note ? ? ?Patient Details  ?Name: ANNALEESE GUIER ?MRN: [id number] ?Date of Birth: 1985-06-05 ? ?Transition of Care (TOC) CM/SW Contact:  ?Leone Haven, RN ?Phone Number: ?09/06/2021, 10:16 AM ? ? ?Clinical Narrative:    ?Patient is for dc today, she states she has her car here that she will be driving. She is indep.  She will be started on farxiga , copay is 4.00 and NCM also gave her a coupon for 0.00 dollars to see if she could use it.  TOC pharmacy to fill meds. ? ? ?Final next level of care: Home/Self Care ?Barriers to Discharge: No Barriers Identified ? ? ?Patient Goals and CMS Choice ?Patient states their goals for this hospitalization and ongoing recovery are:: return home ?  ?Choice offered to / list presented to : NA ? ?Discharge Placement ?  ?           ?  ?  ?  ?  ? ?Discharge Plan and Services ?  ?  ?           ?  ?DME Agency: NA ?  ?  ?  ?HH Arranged: NA ?  ?  ?  ?  ? ?Social Determinants of Health (SDOH) Interventions ?Food Insecurity Interventions: Intervention Not Indicated ?Financial Strain Interventions: Intervention Not Indicated ?Housing Interventions: Intervention Not Indicated ?Transportation Interventions: Intervention Not Indicated ? ? ?Readmission Risk Interventions ?   ? View : No data to display.  ?  ?  ?  ? ? ? ? ? ?

## 2021-09-06 NOTE — Progress Notes (Signed)
Heart Failure Stewardship Pharmacist Progress Note ? ? ?PCP: Health, [facility] ?PCP-Cardiologist: None  ? ? ?HPI:  ?36 yo F with PMH of depression, anxiety, genital herpes, and morbid obesity. She presented to the ED on 4/1 with chest pain and palpitations. CXR with pulmonary vascular congestion. An ECHO was done on 4/3 and LVEF was 60-65%. ? ?Discharge HF Medications: ?Diuretic: furosemide 40 mg PO daily ?ACE/ARB/ARNI: irbesartan 37.5 mg daily ?SGLT2i: Farxiga 10 mg daily ? ?Prior to admission HF Medications: ?None ? ?Pertinent Lab Values: ?Serum creatinine 0.41, BUN 9, Potassium 4.0, Sodium 136, BNP 467.4, Magnesium 1.7, A1c 6.0  ? ?Vital Signs: ?Weight: 286 lbs (admission weight: 301 lbs) ?Blood pressure: 140/50s  ?Heart rate: 90-100s  ?I/O: -1.7L yesterday; net -7.6L ? ?Medication Assistance / Insurance Benefits Check: ?Does the patient have prescription insurance?  Yes ?Type of insurance plan: Managed Medicaid ? ?Outpatient Pharmacy:  ?[facility] ?Is the patient willing to use [facility] TOC pharmacy at discharge? Yes ?Is the patient willing to transition their outpatient pharmacy to utilize a [facility] outpatient pharmacy?   Pending ?  ? ?Assessment: ?1. Acute diastolic CHF (EF 05-69%). NYHA class III symptoms. ?- Continue furosemide 40 mg PO daily at discharge ?- Agree with starting irbesartan 37.5 mg daily ?- Consider adding Farxiga 10 mg daily ?  ?Plan: ?1) Medication changes recommended at this time: ?- Add Farxiga 10 mg daily ? ?2) Patient assistance: ?- Entresto $4 ?- Farxiga $4 ? ?3)  Education  ?- Patient has been educated on current HF medications and potential additions to HF medication regimen ?- Patient verbalizes understanding that over the next few months, these medication doses may change and more medications may be added to optimize HF regimen ?- Patient has been educated on basic disease state pathophysiology and goals of therapy ? ? ?Sharen Hones,  PharmD, BCPS ?Heart Failure Stewardship Pharmacist ?Phone [phone] ? ? ?

## 2021-09-06 NOTE — Discharge Summary (Addendum)
Physician Discharge Summary   Patient: Pamela [name] MRN: [id number] DOB: [birth year]  Admit date:     09/02/2021  Discharge date: 09/06/21  Discharge Physician: Coralie Keens   PCP: [facility], [facility]   Recommendations at discharge:    Patient has been placed on methimazole and propranolol for hyperthyroidism.  Heart failure regimen with irbesartan, dapagliflozin and diuresis with furosemide. Follow up renal function and electrolytes as outpatient.  Follow up with Dr Sharl Ma from endocrinology and cardiology    Discharge Diagnoses: Principal Problem:   Acute diastolic heart failure (HCC) Active Problems:   Normocytic anemia   Graves disease   Mediastinal mass   Hypomagnesemia   Class 3 obesity (HCC)  Resolved Problems:   Tachycardia   Lymphadenopathy  Hospital Course: Pamela [name] was admitted to the hospital with the working diagnosis of decompensated heart failure, high output due to hyperthyroidism.    36 year old [name] with past medial history of depression, anxiety, genital herpes, gonorrhea, class III obesity presents to the ED with a 2-week history of shortness of breath, lower extremity edema, palpitations and orthopnea. On her initial physical examination her blood pressure was 137/73, HR 116, RR 25, 02 saturation 98%, heart with S1 and S2 present and tachycardic, lungs with no wheezing or rales, abdomen not distended and positive lower extremity edema.  Na 139, K 3,6 Cl 104, bicarbonate 22, glucose 83, bun 9 cr <0,30 BNP 467 High sensitive troponin 7  Wbc 7.1 hgb 10,0, hct 31,5 plt 266  TSH 001 free T4 >5,5   Chest radiograph with bilateral hilar vascular congestion.   EKG 117 bpm, normal axis, normal intervals, sinus rhythm with no significant ST segment or T wave changes.   CT chest with no pulmonary embolism, faint bilateral grouns glass opacities, new soft tissue density in the anterior mediastinum. Diffuse enlargement of the thyroid gland.  Mediastinal and bilateral hilar lymphadenopathy.   Patient was placed on furosemide for diuresis.  Echocardiogram with preserved LV systolic function  Positive thyrotropin receptor antibodies.  Patient has been placed on methimazole and propranolol. Plan to follow up as outpatient with endocrinology.    Assessment and Plan: * Acute diastolic heart failure (HCC) Patient was admitted to the cardiac ward, she was placed on furosemide for diuresis, negative fluid balance was achieved, -7,599 ml,  with significant improvement in her symptoms.   Further work up with echocardiogram with EF 60 to 65%, with no wall motion abnormalities. RV with preserved systolic function, no significant valvular disease.   High output heart failure due to thyrotoxicosis.   Patient will continue labetalol and ARB, diuresis with furosemide. Plan to follow up as outpatient, possible transition to entresto.   Graves disease Thyroid ultrasound done with thyromegaly with heterogeneous hyperemic parenchyma, no nodule or other indication for biopsy or further imaging evaluation. -Free T3 level elevated at 17.5 -Thyrotropin receptor antibodies elevated at 19.40.  Continue with methimazole and b blocker. Plan to follow up as outpatient with Dr Sharl Ma from endocrinology.   Mediastinal mass Case discussed with endocrinology Dr. 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 -Patient seen in consultation by hematology/oncology who recommended evaluation of CT abdomen and pelvis which was negative for any further adenopathy and recommended evaluation by CT surgery. -Patient seen in consultation by CT surgery, Dr.  Lightfoot who recommended outpatient follow-up. -Supportive care.     Normocytic  anemia Anemia of chronic disease with iron deficiency.  Her discharge hgb is 11,0 and hct 34,0 Iron panel with serum iron of 35, TIBC 248, transferrin saturation 14 and ferritin 286.  Plan to continue treatment of hyperthyroidism and follow up iron panel as outpatient. If continue low transferrin saturation to consider starting patient on iron supplementation.   Hypomagnesemia Patient renal function remained stable, electrolytes were corrected at the time of her discharge her serum cr is 0,41 with K at 4,0 and serum bicarbonate 25. Mg 1,7 Follow up renal function and electrolytes as outpatient. Continue oral furosemide for diuresis along with KCL supplementation.   Class 3 obesity (HCC) Calculated BMI is 41.5   Tachycardia-resolved as of 09/05/2021 - Likely secondary to hyperthyroidism. -Increase propranolol to 20 mg 3 times daily. -Follow.         Consultants: cardiology  Procedures performed: none   Disposition: Home Diet recommendation:  Cardiac diet DISCHARGE MEDICATION: Allergies as of 09/06/2021       Reactions   Ceftriaxone Hives, Shortness Of Breath, Itching   Penicillins OK   Lactose Intolerance (gi) Other (See Comments)   GI upset   Latex Itching, Other (See Comments)   discoloring of skin   Trazodone And Nefazodone Other (See Comments)   Causes nightmares        Medication List     STOP taking these medications    aspirin EC 325 MG tablet   doxycycline 100 MG tablet Commonly known as: VIBRA-TABS   ibuprofen 200 MG tablet Commonly known as: ADVIL   mupirocin ointment 2 % Commonly known as: BACTROBAN   Phexxi 1.8-1-0.4 % Gel Generic drug: Lactic Ac-Citric Ac-Pot Bitart   traZODone 50 MG tablet Commonly known as: DESYREL       TAKE these medications    dapagliflozin propanediol 10 MG Tabs tablet Commonly known as: Farxiga Take 1 tablet (10 mg total) by mouth daily before breakfast.   fluconazole 150 MG tablet Commonly known as:  DIFLUCAN Take 1 now and 1 in 3 days What changed:  how much to take how to take this when to take this reasons to take this additional instructions   furosemide 40 MG tablet Commonly known as: LASIX Take 1 tablet (40 mg total) by mouth daily.   GOODYS BODY PAIN PO Take 1 packet by mouth 2 (two) times daily as needed (headache/pain).   Hair Skin and Nails Formula Tabs Take 1 tablet by mouth daily.   irbesartan 75 MG tablet Commonly known as: AVAPRO Take 0.5 tablets (37.5 mg total) by mouth daily.   LYSINE PO Take 1 tablet by mouth daily.   methimazole 10 MG tablet Commonly known as: TAPAZOLE Take 1 tablet (10 mg total) by mouth daily.   multivitamin tablet Take 1 tablet by mouth daily.   OREGANO PO Take 1 capsule by mouth daily.   potassium chloride SA 20 MEQ tablet Commonly known as: KLOR-CON M Take 1 tablet (20 mEq total) by mouth daily. Start taking on: September 07, 2021   propranolol 20 MG tablet Commonly known as: INDERAL Take 1 tablet (20 mg total) by mouth 3 (three) times daily.   valACYclovir 1000 MG tablet Commonly known as: VALTREX Take 1 tablet (1,000 mg total) by mouth 2 (two) times daily. What changed:  when to take this reasons to take this        Follow-up Information     Sharl Ma,  Tinnie Gens, MD. Schedule an appointment as soon as possible for a visit in 2 week(s).   Specialty: Endocrinology Contact information: [address] [place] [address] [phone]         [facility]. Go to.   Specialty: Cardiology Why: Tuesday, Arpil 11 @ noon for HV [facility]. Bring all medications with you.  FREE valet parking at [employer], off [address]. Contact information: [address] [id number] mc [place] [address] [phone]        Thomasene Ripple, DO Follow up on 09/20/2021.   Specialty: Cardiology Why: at 9 am for cardiology  follow up Contact information: [address] [place] [address] [phone]                Discharge Exam: Filed Weights   09/04/21 0419 09/05/21 0407 09/06/21 0508  Weight: 132.8 kg 131.4 kg 129.8 kg   BP (!) 149/50 (BP Location: Right Arm)   Pulse 90   Temp 98.5 F (36.9 C) (Oral)   Resp 20   Ht 5\' 10"  (1.778 m)   Wt 129.8 kg   LMP 08/27/2021 Comment: Recent negative Preg test 09/02/2021  SpO2 96%   BMI 41.05 kg/m   Patient is feeling better, no dyspnea or chest pain.  Neurology awake and alert ENT with no pallor Cardiovascular with S1 and S2 present and rhythmic with no gallops or murmurs No JVD No lower extremity edema Respiratory with no wheezing Abdomen soft and non tender  Condition at discharge: stable  The results of significant diagnostics from this hospitalization (including imaging, microbiology, ancillary and laboratory) are listed below for reference.   Imaging Studies: DG Chest 2 View  Result Date: 09/02/2021 CLINICAL DATA:  Chest pain and difficulty breathing. EXAM: CHEST - 2 VIEW COMPARISON:  11/17/2018 FINDINGS: Heart size and mediastinal contours are normal. Pulmonary vascular congestion is noted. No pleural effusion or frank interstitial edema. No superimposed airspace consolidation. Visualized osseous structures are unremarkable. IMPRESSION: Pulmonary vascular congestion. Electronically Signed   By: Signa Kell M.D.   On: 09/02/2021 13:21   CT Angio Chest PE W and/or Wo Contrast  Result Date: 09/02/2021 CLINICAL DATA:  Chest pain. EXAM: CT ANGIOGRAPHY CHEST WITH CONTRAST TECHNIQUE: Multidetector CT imaging of the chest was performed using the standard protocol during bolus administration of intravenous contrast. Multiplanar CT image reconstructions and MIPs were obtained to evaluate the vascular anatomy. RADIATION DOSE REDUCTION: This exam was performed according to the departmental dose-optimization program which includes  automated exposure control, adjustment of the mA and/or kV according to patient size and/or use of iterative reconstruction technique. CONTRAST:  OMNIPAQUE IOHEXOL 350 MG/ML SOLN COMPARISON:  CT angio chest 01/20/2012 FINDINGS: Cardiovascular: Satisfactory opacification of the pulmonary arteries to the segmental level. No evidence of pulmonary embolism. Normal heart size. No pericardial effusion. Mediastinum/Nodes: New soft tissue density is noted within the anterior superior mediastinum between the sternum and the great vessels (series 4, image 28). There is some extension of the soft tissue into the left superior mediastinum to the level of the AP window (series 4, image 41). The soft tissue is distinctly separate from the thyroid. Multiple enlarged mediastinal and hilar nodes are noted including: -1.3 cm short axis right pretracheal node (series 4, image 44) -1.0 cm short axis node at the AP window (series 4, image 44) -1.6 cm right hilar node (series 4, image 59) -1.4 cm  left hilar node (series 4, image 66) No axillary lymphadenopathy. Partially visualized thyroid appears diffusely enlarged compared to prior exam on 01/20/2012. The trachea and esophagus are unremarkable. Lungs/Pleura: Linear subsegmental atelectasis noted in the right lung. Left lung is clear. No pleural effusion or pneumothorax. Upper Abdomen: No acute abnormality. Musculoskeletal: No chest wall abnormality. No acute or significant osseous findings. Review of the MIP images confirms the above findings. IMPRESSION: 1. No pulmonary embolism. 2. Subsegmental atelectasis in the right lung. The lungs are otherwise clear. 3. New soft tissue density in the anterior superior mediastinum, incompletely evaluated on this arterial phase exam. Also noted is diffuse enlargement of the thyroid gland. Findings could represent thymic hyperplasia in the setting of Graves' disease. Recommend correlation with thyroid panel. 4. Mediastinal and bilateral hilar  lymphadenopathy with new soft tissue density in the anterior superior mediastinum also raises differential consideration of malignancy, including lymphoma. Electronically Signed   By: Sherron Ales M.D.   On: 09/02/2021 14:50   CT ABDOMEN PELVIS W CONTRAST  Result Date: 09/04/2021 CLINICAL DATA:  Hematologic malignancy, staging EXAM: CT ABDOMEN AND PELVIS WITH CONTRAST TECHNIQUE: Multidetector CT imaging of the abdomen and pelvis was performed using the standard protocol following bolus administration of intravenous contrast. RADIATION DOSE REDUCTION: This exam was performed according to the departmental dose-optimization program which includes automated exposure control, adjustment of the mA and/or kV according to patient size and/or use of iterative reconstruction technique. CONTRAST:  OMNIPAQUE IOHEXOL 300 MG/ML  SOLN COMPARISON:  None. FINDINGS: Lower chest: No acute abnormality Hepatobiliary: No focal liver abnormality is seen. Status post cholecystectomy. No biliary dilatation. Pancreas: No focal abnormality or ductal dilatation. Spleen: No focal abnormality.  Normal size. Adrenals/Urinary Tract: No adrenal abnormality. No focal renal abnormality. No stones or hydronephrosis. Urinary bladder is unremarkable. Stomach/Bowel: Stomach, large and small bowel grossly unremarkable. Normal appendix. Vascular/Lymphatic: No evidence of aneurysm or adenopathy. Small bilateral inguinal lymph nodes, none pathologically enlarged. Largest lymph node has a short axis diameter of 6 mm. Reproductive: Uterus and adnexa unremarkable.  No mass. Other: No free fluid or free air. Musculoskeletal: No acute bony abnormality. IMPRESSION: No acute findings or significant abnormality in the abdomen or pelvis. Electronically Signed   By: Charlett Nose M.D.   On: 09/04/2021 02:34   US Venous Img Lower Unilateral Left  Result Date: 09/02/2021 CLINICAL DATA:  Swelling x3 weeks EXAM: LEFT LOWER EXTREMITY VENOUS DOPPLER ULTRASOUND  TECHNIQUE: Gray-scale sonography with compression, as well as color and duplex ultrasound, were performed to evaluate the deep venous system(s) from the level of the common femoral vein through the popliteal and proximal calf veins. COMPARISON:  None available FINDINGS: VENOUS Normal compressibility of the common femoral, superficial femoral, and popliteal veins, as well as the visualized calf veins. Visualized portions of profunda femoral vein and great saphenous vein unremarkable. No filling defects to suggest DVT on grayscale or color Doppler imaging. Doppler waveforms show normal direction of venous flow, normal respiratory plasticity and response to augmentation. Limited views of the contralateral common femoral vein are unremarkable. OTHER None. Limitations: none IMPRESSION: Negative. Electronically Signed   By: Corlis Leak M.D.   On: 09/02/2021 16:19   ECHOCARDIOGRAM COMPLETE  Result Date: 09/03/2021    ECHOCARDIOGRAM REPORT   Patient Name:   JAZILYN SEIGLER Date of Exam: 09/03/2021 Medical Rec #:  [id number]    Height:       70.0 in Accession #:    [id number]   Weight:  299.1 lb Date of Birth:  1986-02-16   BSA:          2.477 m Patient Age:    35 years     BP:           144/76 mmHg Patient Gender: F            HR:           97 bpm. Exam Location:  Inpatient Procedure: 2D Echo, 3D Echo, Color Doppler and Cardiac Doppler Indications:    Elevated BNP  History:        Patient has no prior history of Echocardiogram examinations.  Sonographer:    Irving Burton Senior RDCS Referring Phys: [id number] VASUNDHRA RATHORE IMPRESSIONS  1. Left ventricular ejection fraction, by estimation, is 60 to 65%. The left ventricle has normal function. The left ventricle has no regional wall motion abnormalities. There is mild left ventricular hypertrophy. Left ventricular diastolic parameters were normal.  2. Right ventricular systolic function is normal. The right ventricular size is normal.  3. The mitral valve is normal in structure.  Trivial mitral valve regurgitation. No evidence of mitral stenosis.  4. The aortic valve is tricuspid. Aortic valve regurgitation is not visualized. No aortic stenosis is present.  5. The inferior vena cava is normal in size with greater than 50% respiratory variability, suggesting right atrial pressure of 3 mmHg. Comparison(s): No prior Echocardiogram. FINDINGS  Left Ventricle: Left ventricular ejection fraction, by estimation, is 60 to 65%. The left ventricle has normal function. The left ventricle has no regional wall motion abnormalities. The left ventricular internal cavity size was normal in size. There is  mild left ventricular hypertrophy. Left ventricular diastolic parameters were normal. Right Ventricle: The right ventricular size is normal. Right ventricular systolic function is normal. Left Atrium: Left atrial size was normal in size. Right Atrium: Right atrial size was normal in size. Pericardium: There is no evidence of pericardial effusion. Mitral Valve: The mitral valve is normal in structure. Mild mitral annular calcification. Trivial mitral valve regurgitation. No evidence of mitral valve stenosis. Tricuspid Valve: The tricuspid valve is normal in structure. Tricuspid valve regurgitation is trivial. No evidence of tricuspid stenosis. Aortic Valve: The aortic valve is tricuspid. Aortic valve regurgitation is not visualized. No aortic stenosis is present. Pulmonic Valve: The pulmonic valve was normal in structure. Pulmonic valve regurgitation is not visualized. No evidence of pulmonic stenosis. Aorta: The aortic root is normal in size and structure. Venous: The inferior vena cava is normal in size with greater than 50% respiratory variability, suggesting right atrial pressure of 3 mmHg. IAS/Shunts: The interatrial septum was not well visualized.  LEFT VENTRICLE PLAX 2D LVIDd:         4.60 cm      Diastology LVIDs:         3.60 cm      LV e' medial:    8.70 cm/s LV PW:         1.10 cm      LV E/e'  medial:  12.1 LV IVS:        1.20 cm      LV e' lateral:   9.25 cm/s LVOT diam:     1.90 cm      LV E/e' lateral: 11.4 LV SV:         61 LV SV Index:   24 LVOT Area:     2.84 cm  3D Volume EF: LV Volumes (MOD)            3D EF:        45 % LV vol d, MOD A2C: 115.0 ml LV EDV:       197 ml LV vol d, MOD A4C: 132.0 ml LV ESV:       109 ml LV vol s, MOD A2C: 56.7 ml  LV SV:        88 ml LV vol s, MOD A4C: 68.3 ml LV SV MOD A2C:     58.3 ml LV SV MOD A4C:     132.0 ml LV SV MOD BP:      61.9 ml RIGHT VENTRICLE RV S prime:     13.60 cm/s TAPSE (M-mode): 2.2 cm LEFT ATRIUM             Index        RIGHT ATRIUM           Index LA diam:        4.00 cm 1.62 cm/m   RA Area:     20.20 cm LA Vol (A2C):   72.1 ml 29.11 ml/m  RA Volume:   61.10 ml  24.67 ml/m LA Vol (A4C):   50.7 ml 20.47 ml/m LA Biplane Vol: 61.6 ml 24.87 ml/m  AORTIC VALVE LVOT Vmax:   127.00 cm/s LVOT Vmean:  77.300 cm/s LVOT VTI:    0.214 m  AORTA Ao Root diam: 2.80 cm Ao Asc diam:  2.90 cm MITRAL VALVE MV Area (PHT): 3.27 cm     SHUNTS MV Decel Time: 232 msec     Systemic VTI:  0.21 m MV E velocity: 105.00 cm/s  Systemic Diam: 1.90 cm MV A velocity: 64.90 cm/s MV E/A ratio:  1.62 Olga Millers MD Electronically signed by Olga Millers MD Signature Date/Time: 09/03/2021/12:07:Pamela PM    Final    US THYROID  Result Date: 09/03/2021 CLINICAL DATA:  Thyromegaly , low TSH for EXAM: THYROID ULTRASOUND TECHNIQUE: Ultrasound examination of the thyroid gland and adjacent soft tissues was performed. COMPARISON:  CT 09/02/2021 and previous FINDINGS: Parenchymal Echotexture: Markedly heterogenous, hyperemic Isthmus: 0.8 cm thickness Right lobe: 7.8 x 3.1 x 3.7 cm Left lobe: 7.9 x 3.1 x 2.9 cm _________________________________________________________ Estimated total number of nodules >/= 1 cm: 0 Number of spongiform nodules >/=  2 cm not described below (TR1): 0 Number of mixed cystic and solid nodules >/= 1.5 cm not described below  (TR2): 0 _________________________________________________________ No discrete nodules are seen within the thyroid gland. No regional cervical adenopathy identified. IMPRESSION: 1. Thyromegaly with heterogenous hyperemic parenchyma. 2. No nodule or other indication for biopsy or further imaging evaluation. The above is in keeping with the ACR TI-RADS recommendations - J Am Coll Radiol 2017;14:587-595. Electronically Signed   By: Corlis Leak M.D.   On: 09/03/2021 07:33    Microbiology: Results for orders placed or performed during the hospital encounter of 09/01/21  Resp Panel by RT-PCR (Flu A&B, Covid) Nasopharyngeal Swab     Status: None   Collection Time: 09/01/21 10:31 PM   Specimen: Nasopharyngeal Swab; Nasopharyngeal(NP) swabs in vial transport medium  Result Value Ref Range Status   SARS Coronavirus 2 by RT PCR NEGATIVE NEGATIVE Final    Comment: (NOTE) SARS-CoV-2 target nucleic acids are NOT DETECTED.  The SARS-CoV-2 RNA is generally detectable in upper respiratory specimens during the acute phase of infection. The lowest concentration of SARS-CoV-2 viral copies this assay can detect is  138 copies/mL. A negative result does not preclude SARS-Cov-2 infection and should not be used as the sole basis for treatment or other patient management decisions. A negative result may occur with  improper specimen collection/handling, submission of specimen other than nasopharyngeal swab, presence of viral mutation(s) within the areas targeted by this assay, and inadequate number of viral copies(<138 copies/mL). A negative result must be combined with clinical observations, patient history, and epidemiological information. The expected result is Negative.  Fact Sheet for Patients:  BloggerCourse.com  Fact Sheet for Healthcare Providers:  SeriousBroker.it  This test is no t yet approved or cleared by the [place] FDA and  has been  authorized for detection and/or diagnosis of SARS-CoV-2 by FDA under an Emergency Use Authorization (EUA). This EUA will remain  in effect (meaning this test can be used) for the duration of the COVID-19 declaration under Section 564(b)(1) of the Act, 21 U.S.C.section 360bbb-3(b)(1), unless the authorization is terminated  or revoked sooner.       Influenza A by PCR NEGATIVE NEGATIVE Final   Influenza B by PCR NEGATIVE NEGATIVE Final    Comment: (NOTE) The Xpert Xpress SARS-CoV-2/FLU/RSV plus assay is intended as an aid in the diagnosis of influenza from Nasopharyngeal swab specimens and should not be used as a sole basis for treatment. Nasal washings and aspirates are unacceptable for Xpert Xpress SARS-CoV-2/FLU/RSV testing.  Fact Sheet for Patients: BloggerCourse.com  Fact Sheet for Healthcare Providers: SeriousBroker.it  This test is not yet approved or cleared by the [place] FDA and has been authorized for detection and/or diagnosis of SARS-CoV-2 by FDA under an Emergency Use Authorization (EUA). This EUA will remain in effect (meaning this test can be used) for the duration of the COVID-19 declaration under Section 564(b)(1) of the Act, 21 U.S.C. section 360bbb-3(b)(1), unless the authorization is terminated or revoked.  Performed at [facility], [address] [place], [place] [address]     Labs: CBC: Recent Labs  Lab 09/02/21 1258 09/03/21 0910 09/04/21 0429 09/05/21 0356  WBC 7.1 5.4 8.0 9.5  NEUTROABS  --  2.4  --   --   HGB 10.0* 10.4* 10.3* 11.0*  HCT 31.5* 31.9* 31.4* 34.0*  MCV 81.2 80.2 80.5 81.7  PLT 266 273 275 303   Basic Metabolic Panel: Recent Labs  Lab 09/02/21 1258 09/03/21 0910 09/04/21 0429 09/05/21 0356 09/06/21 0525  NA 139 137 137 135 136  K 3.6 3.7 3.9 4.0 4.0  CL 104 102 104 101 103  CO2 22 27 25 24 25   GLUCOSE 83 120* 90 92 104*  BUN 9 5* 9 11 9   CREATININE <0.30*  0.38* 0.38* 0.38* 0.41*  CALCIUM 10.3 10.0 9.8 10.1 10.2  MG  --  1.5* 1.8 1.7  --    Liver Function Tests: Recent Labs  Lab 09/03/21 0910 09/04/21 0429 09/05/21 0356  AST 58* 46* 34  ALT 52* 46* 38  ALKPHOS 62 56 53  BILITOT 0.7 0.4 0.7  PROT 6.5 6.4* 6.5  ALBUMIN 3.3* 3.2* 3.2*   CBG: No results for input(s): GLUCAP in the last 168 hours.  Discharge time spent: greater than 30 minutes.  Signed: Coralie Keens, MD [facility] 09/06/2021

## 2021-09-06 NOTE — Progress Notes (Signed)
Mobility Specialist Progress Note: ? ? 09/06/21 1015  ?Mobility  ?Activity Ambulated independently in hallway  ?Level of Assistance Independent  ?Assistive Device None  ?Distance Ambulated (ft) 1500 ft  ?Activity Response Tolerated well  ?$Mobility charge 1 Mobility  ? ?Pt asx during ambulation. Back in bed with all needs met.  ? ?Nelta Numbers ?Acute Rehab ?Phone: [phone] ?Office Phone: [phone] ? ?

## 2021-09-06 NOTE — Progress Notes (Signed)
Brief Oncology Note: ? ?Scheduling message sent to arrange for outpatient follow-up in approximately 1 month.  In the interim, she has follow up with Dr. Kipp Brood. ? ?Mikey Bussing, DNP, AGPCNP-BC, AOCNP ? ? ?

## 2021-09-06 NOTE — Progress Notes (Signed)
? ?Progress Note ? ?Patient Name: Pamela [name] ?Date of Encounter: 09/06/2021 ? ?Primary Cardiologist: None  ? ?Subjective  ? ?Patient seen and examined at bedside. She is looking forward to discharge today.  ? ?Inpatient Medications  ?  ?Scheduled Meds: ? enoxaparin (LOVENOX) injection  40 mg Subcutaneous Q24H  ? furosemide  40 mg Oral Daily  ? irbesartan  37.5 mg Oral Daily  ? methimazole  10 mg Oral Daily  ? potassium chloride  20 mEq Oral Daily  ? propranolol  20 mg Oral TID  ? ?Continuous Infusions: ? sodium chloride Stopped (09/03/21 1900)  ? ?PRN Meds: ?sodium chloride, acetaminophen, guaiFENesin-dextromethorphan  ? ?Vital Signs  ?  ?Vitals:  ? 09/05/21 0911 09/05/21 1110 09/05/21 2000 09/06/21 0508  ?BP: (!) 133/50 (!) 125/45 (!) 153/62 (!) 149/50  ?Pulse:  90 93 90  ?Resp: 20 14 20 20   ?Temp:  98 ?F (36.7 ?C) 98.1 ?F (36.7 ?C) 98.5 ?F (36.9 ?C)  ?TempSrc:  Oral Oral Oral  ?SpO2:  97% 98% 96%  ?Weight:    129.8 kg  ?Height:      ? ? ?Intake/Output Summary (Last 24 hours) at 09/06/2021 0940 ?Last data filed at 09/06/2021 11/06/2021 ?Gross per 24 hour  ?Intake 598 ml  ?Output 1100 ml  ?Net -502 ml  ? ?Filed Weights  ? 09/04/21 0419 09/05/21 0407 09/06/21 0508  ?Weight: 132.8 kg 131.4 kg 129.8 kg  ? ? ?Telemetry  ?  ?Sinus tachycardia- Personally Reviewed ? ?ECG  ?  ?None- Personally Reviewed ? ?Physical Exam  ?  ?General: Comfortable ?Head: Atraumatic, normal size  ?Eyes: PEERLA, EOMI  ?Neck: Supple, normal JVD ?Cardiac: Normal S1, S2; RRR; no murmurs, rubs, or gallops ?Lungs: Clear to auscultation bilaterally ?Abd: Soft, nontender, no hepatomegaly  ?Ext: warm, edema improved significantly bilateral trace ?Musculoskeletal: No deformities, BUE and BLE strength normal and equal ?Skin: Warm and dry, no rashes   ?Neuro: Alert and oriented to person, place, time, and situation, CNII-XII grossly intact, no focal deficits  ?Psych: Normal mood and affect  ? ?Labs  ?  ?Chemistry ?Recent Labs  ?Lab 09/03/21 ?11/03/21 09/04/21 ?0429  09/05/21 ?0356 09/06/21 ?11/06/21  ?NA 137 137 135 136  ?K 3.7 3.9 4.0 4.0  ?CL 102 104 101 103  ?CO2 27 25 24 25   ?GLUCOSE 120* 90 92 104*  ?BUN 5* 9 11 9   ?CREATININE 0.38* 0.38* 0.38* 0.41*  ?CALCIUM 10.0 9.8 10.1 10.2  ?PROT 6.5 6.4* 6.5  --   ?ALBUMIN 3.3* 3.2* 3.2*  --   ?AST 58* 46* 34  --   ?ALT 52* 46* 38  --   ?ALKPHOS 62 56 53  --   ?BILITOT 0.7 0.4 0.7  --   ?GFRNONAA >60 >60 >60 >60  ?ANIONGAP 8 8 10 8   ?  ? ?Hematology ?Recent Labs  ?Lab 09/03/21 ? 09/04/21 ? 09/05/21 ?0356  ?WBC 5.4 8.0 9.5  ?RBC 3.98 3.90 4.16  ?HGB 10.4* 10.3* 11.0*  ?HCT 31.9* 31.4* 34.0*  ?MCV 80.2 80.5 81.7  ?MCH 26.1 26.4 26.4  ?MCHC 32.6 32.8 32.4  ?RDW 13.4 13.3 13.4  ?PLT 273 275 303  ? ? ?Cardiac EnzymesNo results for input(s): TROPONINI in the last 168 hours. No results for input(s): TROPIPOC in the last 168 hours.  ? ?BNP ?Recent Labs  ?Lab 09/02/21 ?1329 09/03/21 ?8588  ?BNP 467.4* 318.2*  ?  ? ?DDimer No results for input(s): DDIMER in the last 168 hours.  ? ?Radiology  ?  ?  No results found. ? ?Cardiac Studies  ? ?Echo 09/03/2021 ?TTE 09/03/2021 IMPRESSIONS: ? 1. Left ventricular ejection fraction, by estimation, is 60 to 65%. The left ventricle has normal function. The left ventricle has no regional wall motion abnormalities. There is mild left ventricular hypertrophy.  ?Left ventricular diastolic parameters were normal.  ? 2. Right ventricular systolic function is normal. The right ventricular size is normal.  ? 3. The mitral valve is normal in structure. Trivial mitral valve regurgitation. No evidence of mitral stenosis.  ? 4. The aortic valve is tricuspid. Aortic valve regurgitation is not visualized. No aortic stenosis is present.  ? 5. The inferior vena cava is normal in size with greater than 50% respiratory variability, suggesting right atrial pressure of 3 mmHg.  ? ?Comparison(s): No prior Echocardiogram.  ? ?Patient Profile  ?   ?36 y.o. female with history of depression, anxiety, genital herpes, morbid obesity  who was admitted due to shortness of breath and diastolic heart failure.  Also with newly diagnosed hyperthyroidism. ? ?Assessment & Plan  ?  ?Acute on chronic diastolic heart failure-NYHA class II, resolved. Continue po lasix, ok to star.ed on t ARB will transition to entresto in the outpatient. Please start farxiga 10mg  for Hfpef indication ? ?Hypertension-blood pressure elevated, started on Irbesartan - agree with this. ? ?Hypothyroidism-imaging suggestive of Graves' disease versus toxic multinodular goiter.  She has been started on methimazole.  This is being managed by the primary team. ? ?Anemia-hemoglobin stable  ? ?Atypical chest pain-no need for any further ischemic evaluations.  Negative troponin with CT negative for PE. ? ?Morbid obesity-the patient understands the need to lose weight with diet and exercise. We have discussed specific strategies for this. ? ?Ok to be DC from a CV standpoint.  ? ?[facility] will sign off.   ?Medication Recommendations:  Lasix 40mg  every other day, irbesartan 37.5 mg daily, farxiga 10 mg daily ?Other recommendations (labs, testing, etc):  None ?Follow up as an outpatient:   follow up with me 4/20 ? ? ?For questions or updates, please contact [facility] ?Please consult [email or web address] for contact info under Cardiology/STEMI. ?  ?   ?Signed, ? , DO  ?09/06/2021, 9:40 AM    ?

## 2021-09-07 ENCOUNTER — Telehealth: Payer: Self-pay

## 2021-09-07 LAB — ACETYLCHOLINE RECEPTOR AB, ALL
Acety choline binding ab: <0.03 nmol/L (ref 0.00–0.24)
Acetylchol Block Ab: 25 % (ref 0–25)
Acetylcholine Modulat Ab: 0 % (ref 0–45)

## 2021-09-07 NOTE — Telephone Encounter (Signed)
Transition Care Management Unsuccessful Follow-up Telephone Call ? ?Date of discharge and from where:  09/06/2021-[place]  ? ?Attempts:  1st Attempt ? ?Reason for unsuccessful TCM follow-up call:  Left voice message ? ?  ?

## 2021-09-10 ENCOUNTER — Telehealth: Payer: Self-pay | Admitting: *Deleted

## 2021-09-10 NOTE — Telephone Encounter (Signed)
Attempted to contact patient regarding condition since discharge from hospital and address any transportation needs, as well as reviewing upcoming appointments with Dr. Arbutus Ped.  Unable to reach patient at this time. ?

## 2021-09-10 NOTE — Progress Notes (Signed)
? ? ?HEART & VASCULAR TRANSITION OF CARE CONSULT NOTE  ? ? ? ?Referring Physician:Dr Tobb ?Primary Care: None  ?Primary Cardiologist: Dr Servando Salina ?CT Surgery: Dr Cliffton Asters ?Endocrinology: Dr Sharl Ma  ? ?HPI: ?Referred to clinic by Dr Servando Salina for heart failure consultation.  ?  ?Pamela [name] is a 36 year old female with past medial history of depression, anxiety, genital herpes, gonorrhea, hyperthyroidism, class III obesity, mediastinal mass, and HFpEF.  ? ?Admitted 09/02/21 with increased shortness of breath in the setting of A/C HFpEF. Diuresed with IV lasix. Hospital course c/b hyperthyroidism. Free T2 17.5. Stated on methimazole + propanolol.  Mediastinal mass found on CT. CT surgery consulted. She has outpatient follow up with Dr Cliffton Asters the end of April. Outpatient endocrinology follow up was set up but I don't see an appointment.   ? ?She presents for heart failure post hospital follow up. Overall feeling much better. Mild SOB with exertion. Denies PND/Orthopnea. Complaining of yeast infection. Says she has frequent yeast infections. Appetite ok. No fever or chills. Weight at home 290 pounds. Taking all medications. Occasionally smokes marijuana. She works full time in a group home. She works 10 pm -10 am 3 days a week.  ?  ?Cardiac Testing  ?Echo EF 60-65%. RV normal.  ?   ?Review of Systems: [y] = yes, [ ]  = no  ? ?General: Weight gain [ Y]; Weight loss [ ] ; Anorexia [ ] ; Fatigue [ ] ; Fever [ ] ; Chills [ ] ; Weakness [ ]   ?Cardiac: Chest pain/pressure [ ] ; Resting SOB [ ] ; Exertional SOB [Y ]; Orthopnea [ ] ; Pedal Edema [ ] ; Palpitations [ ] ; Syncope [ ] ; Presyncope [ ] ; Paroxysmal nocturnal dyspnea[ ]   ?Pulmonary: Cough [ ] ; Wheezing[ ] ; Hemoptysis[ ] ; Sputum [ ] ; Snoring [ ]   ?GI: Vomiting[ ] ; Dysphagia[ ] ; Melena[ ] ; Hematochezia [ ] ; Heartburn[ ] ; Abdominal pain [ ] ; Constipation [ ] ; Diarrhea [ ] ; BRBPR [ ]   ?GU: Hematuria[ ] ; Dysuria [ ] ; Nocturia[ ]   ?Vascular: Pain in legs with walking [ ] ; Pain in feet with lying  flat [ ] ; Non-healing sores [ ] ; Stroke [ ] ; TIA [ ] ; Slurred speech [ ] ;  ?Neuro: Headaches[ ] ; Vertigo[ ] ; Seizures[ ] ; Paresthesias[ ] ;Blurred vision [ ] ; Diplopia [ ] ; Vision changes [ ]   ?Ortho/Skin: Arthritis [ ] ; Joint pain [ ] ; Muscle pain [ ] ; Joint swelling [ ] ; Back Pain [ ] ; Rash [ ]   ?Psych: Depression[ ] ; Anxiety[ ]   ?Heme: Bleeding problems [ ] ; Clotting disorders [ ] ; Anemia [ Y]  ?Endocrine: Diabetes [ ] ; Thyroid dysfunction[Y ] ? ? ?Past Medical History:  ?Diagnosis Date  ? Abnormal antenatal AFP screen   ? and was low risk.  ? Anxiety   ? Genital herpes   ? Gonorrhea   ? History of abnormal Pap smear   ? History of postpartum depression, currently pregnant   ? HSV-2 infection complicating pregnancy   ? Will suppress at 34 weeks. Don't discuss in front of FOB.  ? Mental disorder   ? hx pp depression now anxiety  ? Papanicolaou smear of cervix with positive high risk human papilloma virus (HPV) test 02/26/2021  ? 02/26/21 repeat pap in 1 year per [facility], 5 year risk for CIN3+ is 4.8%  ? Pregnant   ? Tobacco abuse 01/10/2012  ? ? ?Current Outpatient Medications  ?Medication Sig Dispense Refill  ? Aspirin-Acetaminophen (GOODYS BODY PAIN PO) Take 1 packet by mouth 2 (two) times daily as needed (headache/pain).    ?  fluconazole (DIFLUCAN) 150 MG tablet Take 1 now and 1 in 3 days (Patient taking differently: Take 150 mg by mouth once as needed (yeast infection).) 2 tablet 1  ? furosemide (LASIX) 40 MG tablet Take 1 tablet (40 mg total) by mouth daily. 30 tablet 0  ? irbesartan (AVAPRO) 75 MG tablet Take 0.5 tablets (37.5 mg total) by mouth daily. 15 tablet 0  ? LYSINE PO Take 1 tablet by mouth daily.    ? methimazole (TAPAZOLE) 10 MG tablet Take 1 tablet (10 mg total) by mouth daily. 30 tablet 0  ? Multiple Vitamin (MULTIVITAMIN) tablet Take 1 tablet by mouth daily.    ? Multiple Vitamins-Minerals (HAIR SKIN AND NAILS FORMULA) TABS Take 1 tablet by mouth daily.    ? OREGANO PO Take 1 capsule by  mouth daily.    ? potassium chloride SA (KLOR-CON M) 20 MEQ tablet Take 1 tablet (20 mEq total) by mouth daily. 30 tablet 0  ? propranolol (INDERAL) 20 MG tablet Take 1 tablet (20 mg total) by mouth 3 (three) times daily. 90 tablet 0  ? valACYclovir (VALTREX) 1000 MG tablet Take 1 tablet (1,000 mg total) by mouth 2 (two) times daily. (Patient taking differently: Take 1,000 mg by mouth 2 (two) times daily as needed (outbreak).) 20 tablet 1  ? ?No current facility-administered medications for this encounter.  ? ? ?Allergies  ?Allergen Reactions  ? Ceftriaxone Hives, Shortness Of Breath and Itching  ?  Penicillins OK  ? Lactose Intolerance (Gi) Other (See Comments)  ?  GI upset  ? Latex Itching and Other (See Comments)  ?  discoloring of skin  ? Trazodone And Nefazodone Other (See Comments)  ?  Causes nightmares  ? ? ?  ?Social History  ? ?Socioeconomic History  ? Marital status: Single  ?  Spouse name: Not on file  ? Number of children: 2  ? Years of education: Not on file  ? Highest education level: Not on file  ?Occupational History  ? Occupation: works at group home  ?Tobacco Use  ? Smoking status: Former  ?  Packs/day: 0.25  ?  Years: 5.00  ?  Pack years: 1.25  ?  Types: Cigarettes  ?  Passive exposure: Past  ? Smokeless tobacco: Never  ? Tobacco comments:  ?  Quit smoking x6 months ago  ?Vaping Use  ? Vaping Use: Never used  ?Substance and Sexual Activity  ? Alcohol use: Yes  ?  Comment: occ  ? Drug use: Yes  ?  Frequency: 5.0 times per week  ?  Types: Marijuana  ?  Comment: daily  ? Sexual activity: Yes  ?  Birth control/protection: Condom  ?Other Topics Concern  ? Not on file  ?Social History Narrative  ? Not on file  ? ?Social Determinants of Health  ? ?Financial Resource Strain: Medium Risk  ? Difficulty of Paying Living Expenses: Somewhat hard  ?Food Insecurity: No Food Insecurity  ? Worried About Programme researcher, broadcasting/film/video in the Last Year: Never true  ? Ran Out of Food in the Last Year: Never true   ?Transportation Needs: No Transportation Needs  ? Lack of Transportation (Medical): No  ? Lack of Transportation (Non-Medical): No  ?Physical Activity: Insufficiently Active  ? Days of Exercise per Week: 2 days  ? Minutes of Exercise per Session: 10 min  ?Stress: Stress Concern Present  ? Feeling of Stress : Very much  ?Social Connections: Socially Integrated  ? Frequency of Communication with  Friends and Family: More than three times a week  ? Frequency of Social Gatherings with Friends and Family: Twice a week  ? Attends Religious Services: More than 4 times per year  ? Active Member of Clubs or Organizations: Yes  ? Attends BankerClub or Organization Meetings: More than 4 times per year  ? Marital Status: Living with partner  ?Intimate Partner Violence: Not At Risk  ? Fear of Current or Ex-Partner: No  ? Emotionally Abused: No  ? Physically Abused: No  ? Sexually Abused: No  ? ? ?  ?Family History  ?Problem Relation Age of Onset  ? Nephrolithiasis Father   ? Graves' disease Father   ? Thyroid disease Father   ? Hypertension Other   ? Cancer Other   ?     colon  ? ? ?Vitals:  ? 09/11/21 1159  ?BP: 128/70  ?Pulse: (!) 101  ?SpO2: 100%  ?Weight: 131.6 kg (290 lb 3.2 oz)  ?Height: 5\' 10"  (1.778 m)  ? ?Wt Readings from Last 3 Encounters:  ?09/11/21 131.6 kg (290 lb 3.2 oz)  ?09/06/21 129.8 kg (286 lb 1.6 oz)  ?09/01/21 (!) 141.1 kg (311 lb 2 oz)  ?  ?PHYSICAL EXAM: ?General:  Walked in the clinic. No respiratory difficulty ?HEENT: normal ?Neck: supple. no JVD. Carotids 2+ bilat; no bruits. No lymphadenopathy or thryomegaly appreciated. ?Cor: PMI nondisplaced. Regular rate & rhythm. No rubs, gallops or murmurs. ?Lungs: clear ?Abdomen: obese, soft, nontender, nondistended. No hepatosplenomegaly. No bruits or masses. Good bowel sounds. ?Extremities: no cyanosis, clubbing, rash, edema ?Neuro: alert & oriented x 3, cranial nerves grossly intact. moves all 4 extremities w/o difficulty. Affect pleasant. ? ?ECG: SR 85 bpm  personally checked.  ? ? ?ASSESSMENT & PLAN: ?1.Chronic HFpEF ?-Echo EF 60-65% RV normal. Recently hospitalized and diuresed. C/B hyperthyroidism ?-NYHA II. Volume status stable.  ?GDMT  ?Diuretic- Continue lasix 40 mg daily

## 2021-09-10 NOTE — Telephone Encounter (Signed)
Transition Care Management Unsuccessful Follow-up Telephone Call ? ?Date of discharge and from where:  09/06/2021-[place] ? ?Attempts:  3rd Attempt ? ?Reason for unsuccessful TCM follow-up call:  Left voice message ? ?Oncology office attempted to contact patient today which would ne considered the 2nd attempt.  ? ?  ?

## 2021-09-11 ENCOUNTER — Telehealth (HOSPITAL_COMMUNITY): Payer: Self-pay

## 2021-09-11 ENCOUNTER — Telehealth: Payer: Self-pay | Admitting: Internal Medicine

## 2021-09-11 ENCOUNTER — Encounter (HOSPITAL_COMMUNITY): Payer: Self-pay

## 2021-09-11 ENCOUNTER — Ambulatory Visit (HOSPITAL_COMMUNITY)
Admit: 2021-09-11 | Discharge: 2021-09-11 | Disposition: A | Discharge status: Home / Self Care | Payer: BC Managed Care – PPO | Attending: Adult Health | Admitting: Adult Health

## 2021-09-11 VITALS — BP 128/70 | HR 101 | Ht 70.0 in | Wt 290.2 lb

## 2021-09-11 DIAGNOSIS — I5032 Chronic diastolic (congestive) heart failure: Secondary | ICD-10-CM | POA: Insufficient documentation

## 2021-09-11 DIAGNOSIS — R000 Tachycardia, unspecified: Secondary | ICD-10-CM | POA: Diagnosis not present

## 2021-09-11 DIAGNOSIS — E0590 Thyrotoxicosis, unspecified without thyrotoxic crisis or storm: Secondary | ICD-10-CM | POA: Diagnosis not present

## 2021-09-11 DIAGNOSIS — Z79899 Other long term (current) drug therapy: Secondary | ICD-10-CM | POA: Diagnosis not present

## 2021-09-11 DIAGNOSIS — R222 Localized swelling, mass and lump, trunk: Secondary | ICD-10-CM | POA: Insufficient documentation

## 2021-09-11 LAB — BASIC METABOLIC PANEL
Anion gap: 7 (ref 5–15)
BUN: 11 mg/dL (ref 6–20)
CO2: 23 mmol/L (ref 22–32)
Calcium: 9.8 mg/dL (ref 8.9–10.3)
Chloride: 106 mmol/L (ref 98–111)
Creatinine, Ser: 0.4 mg/dL — ABNORMAL LOW (ref 0.44–1.00)
GFR, Estimated: >60 mL/min (ref >60)
Glucose, Bld: 100 mg/dL — ABNORMAL HIGH (ref 70–99)
Potassium: 4 mmol/L (ref 3.5–5.1)
Sodium: 136 mmol/L (ref 135–145)

## 2021-09-11 NOTE — Telephone Encounter (Signed)
.  Called patient to schedule appointment per 4/7 inbasket, patient is aware of date and time.   ?

## 2021-09-11 NOTE — Telephone Encounter (Signed)
Call attempted to confirm HV TOC appt today 4/11 @ 12Noon. HIPPA appropriate VM left with callback number.  ? ?Ozella Rocks, MSN, RN ?Heart Failure Nurse Navigator ? ? ? ?

## 2021-09-11 NOTE — Patient Instructions (Addendum)
STOP Farxiga ? ? ?Please call Dr Sharl Ma for follow  up [phone] with endocrinology  ? ? ?Labs today ?We will only contact you if something comes back abnormal or we need to make some changes. ?Otherwise no news is good news! ? ?You have been referred to Primary Care Providers ?-see attached ? ? ?Your physician recommends that you schedule a follow-up appointment in the Heart Failure Clinic as needed ? ? ?Do the following things EVERYDAY: ?Weigh yourself in the morning before breakfast. Write it down and keep it in a log. ?Take your medicines as prescribed ?Eat low salt foods--Limit salt (sodium) to 2000 mg per day.  ?Stay as active as you can everyday ?Limit all fluids for the day to less than 2 liters ? ?At the [facility], you and your health needs are our priority. As part of our continuing mission to provide you with exceptional heart care, we have created designated Provider Care Teams. These Care Teams include your primary Cardiologist (physician) and Advanced Practice Providers (APPs- Physician Assistants and Nurse Practitioners) who all work together to provide you with the care you need, when you need it.  ? ?You may see any of the following providers on your designated Care Team at your next follow up: ?Dr Arvilla Meres ?Dr Marca Ancona ?Tonye Becket, NP ?Robbie Lis, PA ?Jessica Milford,NP ?Anna Genre, PA ?Karle Plumber, PharmD ? ? ?Please be sure to bring in all your medications bottles to every appointment.  ? ? ? ?

## 2021-09-20 ENCOUNTER — Ambulatory Visit (INDEPENDENT_AMBULATORY_CARE_PROVIDER_SITE_OTHER): Payer: BC Managed Care – PPO | Admitting: Cardiology

## 2021-09-20 ENCOUNTER — Encounter: Payer: Self-pay | Admitting: Cardiology

## 2021-09-20 VITALS — BP 104/60 | HR 99 | Ht 70.0 in | Wt 299.6 lb

## 2021-09-20 DIAGNOSIS — F419 Anxiety disorder, unspecified: Secondary | ICD-10-CM

## 2021-09-20 DIAGNOSIS — I5032 Chronic diastolic (congestive) heart failure: Secondary | ICD-10-CM

## 2021-09-20 DIAGNOSIS — Z79899 Other long term (current) drug therapy: Secondary | ICD-10-CM | POA: Diagnosis not present

## 2021-09-20 DIAGNOSIS — F32A Depression, unspecified: Secondary | ICD-10-CM

## 2021-09-20 LAB — COMPREHENSIVE METABOLIC PANEL
ALT: 22 IU/L (ref 0–32)
AST: 21 IU/L (ref 0–40)
Albumin/Globulin Ratio: 1.4 (ref 1.2–2.2)
Albumin: 4 g/dL (ref 3.8–4.8)
Alkaline Phosphatase: 95 IU/L (ref 44–121)
BUN/Creatinine Ratio: 20 (ref 9–23)
BUN: 10 mg/dL (ref 6–20)
Bilirubin Total: 0.3 mg/dL (ref 0.0–1.2)
CO2: 24 mmol/L (ref 20–29)
Calcium: 9.9 mg/dL (ref 8.7–10.2)
Chloride: 101 mmol/L (ref 96–106)
Creatinine, Ser: 0.49 mg/dL — ABNORMAL LOW (ref 0.57–1.00)
Globulin, Total: 2.8 g/dL (ref 1.5–4.5)
Glucose: 96 mg/dL (ref 70–99)
Potassium: 4.4 mmol/L (ref 3.5–5.2)
Sodium: 137 mmol/L (ref 134–144)
Total Protein: 6.8 g/dL (ref 6.0–8.5)
eGFR: 128 mL/min/{1.73_m2} (ref >59)

## 2021-09-20 LAB — MAGNESIUM: Magnesium: 1.7 mg/dL (ref 1.6–2.3)

## 2021-09-20 MED ORDER — FUROSEMIDE 40 MG PO TABS: 40.0000 mg | ORAL_TABLET | Freq: Two times a day (BID) | ORAL | 3 refills | Status: DC

## 2021-09-20 NOTE — Patient Instructions (Addendum)
Medication Instructions:  ?Your physician has recommended you make the following change in your medication:  ?START: Lasix 40 mg twice daily ?*If you need a refill on your cardiac medications before your next appointment, please call your pharmacy* ? ? ?Lab Work: ?Your physician recommends that you return for lab work in:  ?TODAY: CMET, Mag ?If you have labs (blood work) drawn today and your tests are completely normal, you will receive your results only by: ?MyChart Message (if you have MyChart) OR ?A paper copy in the mail ?If you have any lab test that is abnormal or we need to change your treatment, we will call you to review the results. ? ? ?Testing/Procedures: ?None ? ? ?Follow-Up: ?At [facility], you and your health needs are our priority.  As part of our continuing mission to provide you with exceptional heart care, we have created designated Provider Care Teams.  These Care Teams include your primary Cardiologist (physician) and Advanced Practice Providers (APPs -  Physician Assistants and Nurse Practitioners) who all work together to provide you with the care you need, when you need it. ? ?We recommend signing up for the patient portal called "MyChart".  Sign up information is provided on this After Visit Summary.  MyChart is used to connect with patients for Virtual Visits (Telemedicine).  Patients are able to view lab/test results, encounter notes, upcoming appointments, etc.  Non-urgent messages can be sent to your provider as well.   ?To learn more about what you can do with MyChart, go to [email or web address].   ? ?Your next appointment:   ?12 week(s) ? ?The format for your next appointment:   ?In Person ? ?Provider:   ?Thomasene Ripple, DO   ? ? ?Other Instructions ? ? ?Important Information About Sugar ? ? ? ? ?  ?

## 2021-09-20 NOTE — Progress Notes (Signed)
?Heart and Vascular Care Navigation ? ?09/20/2021 ? ?ANGELETTE GANUS ?Oct 13, 1985 ?[id number] ? ?Reason for Referral:  ?  ?Engaged with patient face to face for initial visit for Heart and Vascular Care Coordination. ?                                                                                                  ?Assessment:        ?LCSW met with pt briefly at the end of her appt today w/ Dr. Harriet Masson. Introduced self, role, reason for visit. Pt balancing current stressors of a new diagnosis, parenting, and working/being out of work. Pt confirmed home address, lives with sons and near her parents. 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 She shares that the new diagnosis has been a lot, and that her health/managing her health is important to her. She is interested in supportive counseling for managing the impact of a new diagnosis. She would like to walk in for support if possible. 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 I provided her with [facility] flyer as well, pt familiar with this resource. I also let her know about our women's support group and she is agreeable to being on the mailing list for this support. Pt provided with my card, encouraged to f/u if none of these resources pan out to be what she is looking for.  ? ?Inquired about additional living costs, pt has been making it by- receives no assistance at this time due to income. Since there has been an impact to this/guidelines can sometimes change I inquired if she was interested in me making a new referral to DSS for screening, she is amenable. This will be sent through [facility] and a caseworker should call and screen her. If any additional financial needs arise encouraged her to call or text this Probation officer.                              ? ?HRT/VAS Care Coordination   ? ? Patients Home Cardiology Office  [facility]  ? Outpatient Care Team Social Worker  ? Social Worker Name: Margarito Liner [facility] [phone]  ? Living arrangements for the past 2 months Single Family Home  ? Lives with: Minor Children  ? Patient Current Librarian, academic; [place]  ? Patient Has Concern With Paying Medical Bills No  ? Does Patient Have Prescription Coverage? Yes  ? Home Assistive Devices/Equipment None  ? DME Agency NA  ? ?  ? ? ?Social History:                                                                             ?  SDOH Screenings  ? ?Alcohol Screen: Low Risk   ? Last Alcohol Screening Score (AUDIT): 0  ?Depression (PHQ2-9): Medium Risk  ? PHQ-2 Score: 11  ?Financial Resource Strain: Medium Risk  ? Difficulty of Paying Living Expenses: Somewhat hard  ?Food Insecurity: No Food Insecurity  ? Worried About Running Out of Food in the Last Year: Never true  ? Ran Out of Food in the Last Year: Never true  ?Housing: Low Risk   ? Last Housing Risk Score: 0  ?Physical Activity: Insufficiently Active  ? Days of Exercise per Week: 2 days  ? Minutes of Exercise per Session: 10 min  ?Social Connections: Socially Integrated  ? Frequency of Communication with Friends and Family: More than three times a week  ? Frequency of Social Gatherings with Friends and Family: Twice a week  ? Attends Religious Services: More than 4 times per year  ? Active Member of Clubs or Organizations: Yes  ? Attends Club or Organization Meetings: More than 4 times per year  ? Marital Status: Living with partner  ?Stress: Stress Concern Present  ? Feeling of Stress : Very much  ?Tobacco Use: Medium Risk  ? Smoking Tobacco Use: Former  ? Smokeless Tobacco Use: Never  ? Passive Exposure: Past  ?Transportation Needs: No Transportation Needs  ? Lack of Transportation (Medical): No  ? Lack of Transportation (Non-Medical): No  ? ? ?SDOH Interventions: ?Financial Resources:  DSS for financial assistance- SNAP referral   ?Food Insecurity:  DSS for financial assistance  ?Transportation:   No needs noted  ? ? ? ?Other Care Navigation Interventions:    ? ?Provided Pharmacy assistance resources  Medicaid- $4 copays at this time  ?Patient expressed Mental Health concerns Yes, Referred to:  [facility], [facility], [facility]  ? ?Follow-up plan:   ?LCSW remains available, pt has my card, have added her to f/u list for Women's Group.  ? ? ? ?

## 2021-09-20 NOTE — Progress Notes (Signed)
?Cardiology Office Note:   ? ?Date:  09/21/2021  ? ?ID:  Pamela [name], DOB 1986-01-29, MRN [id number] ? ?PCP:  [facility]  ?Cardiologist:  Thomasene Ripple, DO  ?Electrophysiologist:  None  ? ?Referring MD: [facility], [facility]  ? ?:" I am really nervous" ? ?History of Present Illness:   ? ?Pamela [name] is a 36 y.o. female with a hx of recently diagnosed diastolic heart failure and hypothyroidism suspected to be Graves' disease.  And was also found to have anterior mediastinal mass during that hospitalization. ? ?During her hospitalization I did follow the patient at which time she did get some IV Lasix she was diuresed effectively and she was discharged home.  She was placed on furosemide, irbesartan as well as SGL 2 inhibitors.  Since her hospitalization she tells me that she had 2 epidurals have given her significant yeast infection and she has stopped this medication. ? ?She also tells me that she is experiencing a great deal of anxiety and is even worried to sleep because she is concerned that she is going to die and leave her kids.  She is very teary in the office.  And is asking for information for help.  She has not establish care with a primary care doctor yet. ? ? ? ?Past Medical History:  ?Diagnosis Date  ? Abnormal antenatal AFP screen   ? [place] and Cathlean Sauer was low risk.  ? Anxiety   ? Genital herpes   ? Gonorrhea   ? History of abnormal Pap smear   ? History of postpartum depression, currently pregnant   ? HSV-2 infection complicating pregnancy   ? Will suppress at 34 weeks. Don't discuss in front of FOB.  ? Mental disorder   ? hx pp depression now anxiety  ? Papanicolaou smear of cervix with positive high risk human papilloma virus (HPV) test 02/26/2021  ? 02/26/21 repeat pap in 1 year per [facility], 5 year risk for CIN3+ is 4.8%  ? Pregnant   ? Tobacco abuse 01/10/2012  ? ? ?Past Surgical History:  ?Procedure Laterality Date  ? CESAREAN SECTION  03/04/2011  ? Procedure: CESAREAN SECTION;   Surgeon: Lesly Dukes, MD;  Location: [facility];  Service: Gynecology;  Laterality: N/A;  primary of baby  boy at 34  ? CESAREAN SECTION N/A 10/27/2012  ? Procedure: CESAREAN SECTION;  Surgeon: Allie Bossier, MD;  Location: [facility];  Service: Obstetrics;  Laterality: N/A;  ? CHOLECYSTECTOMY  01/10/2012  ? Procedure: LAPAROSCOPIC CHOLECYSTECTOMY;  Surgeon: Dalia Heading, MD;  Location: [facility];  Service: General;  Laterality: N/A;  attempted Intraopertive cholangiogram  ? TOE SURGERY Left   ? 02/2021  ? TONSILLECTOMY    ? ? ?Current Medications: ?Current Meds  ?Medication Sig  ? Aspirin-Acetaminophen (GOODYS BODY PAIN PO) Take 1 packet by mouth 2 (two) times daily as needed (headache/pain).  ? fluconazole (DIFLUCAN) 150 MG tablet Take 1 now and 1 in 3 days (Patient taking differently: Take 150 mg by mouth once as needed (yeast infection).)  ? furosemide (LASIX) 40 MG tablet Take 1 tablet (40 mg total) by mouth 2 (two) times daily.  ? irbesartan (AVAPRO) 75 MG tablet Take 0.5 tablets (37.5 mg total) by mouth daily.  ? LYSINE PO Take 1 tablet by mouth daily.  ? methimazole (TAPAZOLE) 10 MG tablet Take 1 tablet (10 mg total) by mouth daily.  ? Multiple Vitamin (MULTIVITAMIN) tablet Take 1 tablet by mouth daily.  ? Multiple  Vitamins-Minerals (HAIR SKIN AND NAILS FORMULA) TABS Take 1 tablet by mouth daily.  ? OREGANO PO Take 1 capsule by mouth daily.  ? potassium chloride SA (KLOR-CON M) 20 MEQ tablet Take 1 tablet (20 mEq total) by mouth daily.  ? propranolol (INDERAL) 20 MG tablet Take 1 tablet (20 mg total) by mouth 3 (three) times daily.  ? valACYclovir (VALTREX) 1000 MG tablet Take 1 tablet (1,000 mg total) by mouth 2 (two) times daily. (Patient taking differently: Take 1,000 mg by mouth 2 (two) times daily as needed (outbreak).)  ? [DISCONTINUED] furosemide (LASIX) 40 MG tablet Take 1 tablet (40 mg total) by mouth daily.  ?  ? ?Allergies:   Ceftriaxone, Lactose intolerance (gi), Latex, and Trazodone and nefazodone   ? ?Social History  ? ?Socioeconomic History  ? Marital status: Single  ?  Spouse name: Not on file  ? Number of children: 2  ? Years of education: Not on file  ? Highest education level: Not on file  ?Occupational History  ? Occupation: works at group home  ?Tobacco Use  ? Smoking status: Former  ?  Packs/day: 0.25  ?  Years: 5.00  ?  Pack years: 1.25  ?  Types: Cigarettes  ?  Passive exposure: Past  ? Smokeless tobacco: Never  ? Tobacco comments:  ?  Quit smoking x6 months ago  ?Vaping Use  ? Vaping Use: Never used  ?Substance and Sexual Activity  ? Alcohol use: Yes  ?  Comment: occ  ? Drug use: Yes  ?  Frequency: 5.0 times per week  ?  Types: Marijuana  ?  Comment: daily  ? Sexual activity: Yes  ?  Birth control/protection: Condom  ?Other Topics Concern  ? Not on file  ?Social History Narrative  ? Not on file  ? ?Social Determinants of Health  ? ?Financial Resource Strain: Medium Risk  ? Difficulty of Paying Living Expenses: Somewhat hard  ?Food Insecurity: No Food Insecurity  ? Worried About Programme researcher, broadcasting/film/videounning Out of Food in the Last Year: Never true  ? Ran Out of Food in the Last Year: Never true  ?Transportation Needs: No Transportation Needs  ? Lack of Transportation (Medical): No  ? Lack of Transportation (Non-Medical): No  ?Physical Activity: Insufficiently Active  ? Days of Exercise per Week: 2 days  ? Minutes of Exercise per Session: 10 min  ?Stress: Stress Concern Present  ? Feeling of Stress : Very much  ?Social Connections: Socially Integrated  ? Frequency of Communication with Friends and Family: More than three times a week  ? Frequency of Social Gatherings with Friends and Family: Twice a week  ? Attends Religious Services: More than 4 times per year  ? Active Member of Clubs or Organizations: Yes  ? Attends BankerClub or Organization Meetings: More than 4 times per year  ? Marital Status: Living with partner  ?  ? ?Family History: ?The patient's family history includes Cancer in an other family member; Luiz BlareGraves'  disease in her father; Hypertension in an other family member; Nephrolithiasis in her father; Thyroid disease in her father. ? ?ROS:   ?Review of Systems  ?Constitution: Negative for decreased appetite, fever and weight gain.  ?HENT: Negative for congestion, ear discharge, hoarse voice and sore throat.   ?Eyes: Negative for discharge, redness, vision loss in right eye and visual halos.  ?Cardiovascular: Negative for chest pain, dyspnea on exertion, leg swelling, orthopnea and palpitations.  ?Respiratory: Negative for cough, hemoptysis, shortness of breath and snoring.   ?  Endocrine: Negative for heat intolerance and polyphagia.  ?Hematologic/Lymphatic: Negative for bleeding problem. Does not bruise/bleed easily.  ?Skin: Negative for flushing, nail changes, rash and suspicious lesions.  ?Musculoskeletal: Negative for arthritis, joint pain, muscle cramps, myalgias, neck pain and stiffness.  ?Gastrointestinal: Negative for abdominal pain, bowel incontinence, diarrhea and excessive appetite.  ?Genitourinary: Negative for decreased libido, genital sores and incomplete emptying.  ?Neurological: Negative for brief paralysis, focal weakness, headaches and loss of balance.  ?Psychiatric/Behavioral: Negative for altered mental status, depression and suicidal ideas.  ?Allergic/Immunologic: Negative for HIV exposure and persistent infections.  ? ? ?EKGs/Labs/Other Studies Reviewed:   ? ?The following studies were reviewed today: ? ? ?EKG:  None today  ? ?TTE/  ? ?Recent Labs: ?09/02/2021: TSH 0.001 ?09/03/2021: B Natriuretic Peptide 318.2 ?09/05/2021: Hemoglobin 11.0; Platelets 303 ?09/20/2021: ALT 22; BUN 10; Creatinine, Ser 0.49; Magnesium 1.7; Potassium 4.4; Sodium 137  ?Recent Lipid Panel ?   ?Component Value Date/Time  ? CHOL 186 04/07/2019 1031  ? TRIG 70 04/07/2019 1031  ? HDL 49 04/07/2019 1031  ? CHOLHDL 3.8 04/07/2019 1031  ? LDLCALC 124 (H) 04/07/2019 1031  ? ? ?Physical Exam:   ? ?VS:  BP 104/60   Pulse 99   Ht 5\' 10"   (1.778 m)   Wt 299 lb 9.6 oz (135.9 kg)   LMP 08/27/2021 Comment: Recent negative Preg test 09/02/2021  SpO2 99%   BMI 42.99 kg/m?    ? ?Wt Readings from Last 3 Encounters:  ?09/21/21 299 lb (135.6 kg)  ?09/20/21 2

## 2021-09-21 ENCOUNTER — Ambulatory Visit (INDEPENDENT_AMBULATORY_CARE_PROVIDER_SITE_OTHER): Payer: BC Managed Care – PPO | Admitting: Thoracic Surgery (Cardiothoracic Vascular Surgery)

## 2021-09-21 ENCOUNTER — Other Ambulatory Visit: Payer: Self-pay | Admitting: *Deleted

## 2021-09-21 VITALS — BP 136/85 | HR 105 | Resp 20 | Ht 70.0 in | Wt 299.0 lb

## 2021-09-21 DIAGNOSIS — J9859 Other diseases of mediastinum, not elsewhere classified: Secondary | ICD-10-CM | POA: Diagnosis not present

## 2021-09-21 NOTE — Progress Notes (Signed)
? ?   ?  [address] ?      [place] [address] ?            [phone]       ? ?Theodis Shove ?[place] Record [id number] ?Date of Birth: May 18, 1986 ? ?Referring: Regan Lemming, MD ?Primary Care: [facility] ?Primary Cardiologist:Kardie Tobb, DO ? ?Reason for visit:   follow-up ? ?History of Present Illness:     ?Ms. Larance comes in hospital follow-up.  She was found to have an anterior mediastinal mass along with hilar and mediastinal adenopathy when she was admitted for Graves' flare and heart failure exacerbation.  She only complains of anxiety today. ? ?Physical Exam: ?BP 136/85 (BP Location: Left Arm, Patient Position: Sitting)   Pulse (!) 105   Resp 20   Ht 5\' 10"  (1.778 m)   Wt 299 lb (135.6 kg)   LMP 08/27/2021 Comment: Recent negative Preg test 09/02/2021  SpO2 96% Comment: RA  BMI 42.90 kg/m?  ? ?Alert NAD ?Abdomen, ND ?No peripheral edema ? ? ? ?  ? ?Assessment / Plan:   ?36 year old female with anterior mediastinal mass, hilar and mediastinal adenopathy and Graves' disease.  I will order tumor markers, and an MRI.  I will see her back in 1 month when these have resulted. ? ? ?Lucile Crater Heylee Tant ?09/21/2021 3:24 PM ? ? ? ? ? ? ?

## 2021-09-24 ENCOUNTER — Other Ambulatory Visit: Payer: Self-pay

## 2021-09-24 MED ORDER — PROPRANOLOL HCL 20 MG PO TABS: 20.0000 mg | ORAL_TABLET | Freq: Three times a day (TID) | ORAL | 3 refills | Status: DC

## 2021-09-24 MED ORDER — POTASSIUM CHLORIDE CRYS ER 20 MEQ PO TBCR
20.0000 meq | EXTENDED_RELEASE_TABLET | Freq: Every day | ORAL | 3 refills | Status: DC
Start: 2021-09-24 — End: 2023-05-25

## 2021-10-01 DIAGNOSIS — Z202 Contact with and (suspected) exposure to infections with a predominantly sexual mode of transmission: Secondary | ICD-10-CM | POA: Diagnosis not present

## 2021-10-03 ENCOUNTER — Other Ambulatory Visit: Payer: Self-pay | Admitting: Internal Medicine

## 2021-10-03 DIAGNOSIS — J9859 Other diseases of mediastinum, not elsewhere classified: Secondary | ICD-10-CM

## 2021-10-04 ENCOUNTER — Inpatient Hospital Stay: Payer: BC Managed Care – PPO | Attending: Internal Medicine | Admitting: Internal Medicine

## 2021-10-04 ENCOUNTER — Inpatient Hospital Stay: Payer: BC Managed Care – PPO

## 2021-10-08 ENCOUNTER — Other Ambulatory Visit: Payer: Self-pay | Admitting: Adult Health

## 2021-10-09 ENCOUNTER — Other Ambulatory Visit (HOSPITAL_COMMUNITY): Payer: Self-pay

## 2021-10-09 ENCOUNTER — Other Ambulatory Visit: Payer: Self-pay | Admitting: Adult Health

## 2021-10-09 ENCOUNTER — Telehealth: Payer: Self-pay | Admitting: Licensed Clinical Social Worker

## 2021-10-09 NOTE — Telephone Encounter (Signed)
Follow up text message [phone], sent to pt to f/u on resources given and for general check in.  ? ?No response yet at this time.  ? ?Westley Hummer, MSW, LCSW ?Clinical Social Worker II ?[facility]  ?[phone]- work cell phone (preferred) ?[phone]- desk phone ? ?

## 2021-10-19 ENCOUNTER — Telehealth: Payer: BC Managed Care – PPO | Admitting: Thoracic Surgery (Cardiothoracic Vascular Surgery)

## 2021-11-05 ENCOUNTER — Ambulatory Visit (INDEPENDENT_AMBULATORY_CARE_PROVIDER_SITE_OTHER): Payer: BC Managed Care – PPO | Admitting: Adult Health

## 2021-11-05 ENCOUNTER — Encounter: Payer: Self-pay | Admitting: Adult Health

## 2021-11-05 ENCOUNTER — Other Ambulatory Visit (HOSPITAL_COMMUNITY)
Admission: RE | Admit: 2021-11-05 | Discharge: 2021-11-05 | Disposition: A | Discharge status: Home / Self Care | Payer: BC Managed Care – PPO | Source: Ambulatory Visit | Attending: Adult Health | Admitting: Adult Health

## 2021-11-05 ENCOUNTER — Other Ambulatory Visit (HOSPITAL_COMMUNITY)
Admission: RE | Admit: 2021-11-05 | Discharge: 2021-11-05 | Disposition: A | Discharge status: Home / Self Care | Payer: BC Managed Care – PPO | Source: Ambulatory Visit

## 2021-11-05 VITALS — BP 130/81 | HR 102 | Ht 70.0 in | Wt 295.2 lb

## 2021-11-05 DIAGNOSIS — N898 Other specified noninflammatory disorders of vagina: Secondary | ICD-10-CM | POA: Insufficient documentation

## 2021-11-05 DIAGNOSIS — N644 Mastodynia: Secondary | ICD-10-CM | POA: Diagnosis not present

## 2021-11-05 DIAGNOSIS — N6321 Unspecified lump in the left breast, upper outer quadrant: Secondary | ICD-10-CM | POA: Diagnosis not present

## 2021-11-05 DIAGNOSIS — N6452 Nipple discharge: Secondary | ICD-10-CM | POA: Insufficient documentation

## 2021-11-05 NOTE — Progress Notes (Signed)
  Subjective:     Patient ID: Theodis Shove, female   DOB: April 12, 1986, 36 y.o.   MRN: [id number]  HPI Narda Rutherford is a 36 year old black female, single, [id number] in complaining of pain in left breast, but has resolved. She had CHF in April, she says from [place].  Lab Results  Component Value Date   DIAGPAP  02/16/2021    - Negative for Intraepithelial Lesions or Malignancy (NILM)   HPVHIGH Positive (A) 02/16/2021   PCP is [facility]  Review of Systems Has pain in left breast for about 2 weeks, has resolved Has vaginal discharge like yeast  Reviewed past medical,surgical, social and family history. Reviewed medications and allergies.     Objective:   Physical Exam BP 130/81 (BP Location: Right Arm, Patient Position: Sitting, Cuff Size: Normal)   Pulse (!) 102   Ht 5\' 10"  (1.778 m)   Wt 295 lb 3.2 oz (133.9 kg)   LMP 10/19/2021   BMI 42.36 kg/m      Skin warm and dry,  Breasts:no dominate palpable mass, retraction or nipple discharge on the right, on the left has curd like mass at 12 0' clock 1 FB from areola, tender and had clear, grayish discharge from 1 site left nipple placed on glass slide to air dry, no retraction. She has bilateral nipple rods and tattoos  Fall risk is moderate  [facility] - 11/05/21 1158       Pregnancy Intention Screening   Does the patient want to become pregnant in the next year? No    Does the patient's partner want to become pregnant in the next year? No    Would the patient like to discuss contraceptive options today? No      Contraception Wrap Up   Current Method Spermicide (used alone)    End Method Spermicide (Used Alone)             Assessment:     1. Mass of upper outer quadrant of left breast Will get diagnostic mammogram and [place] if needed 11/20/21 at 11 am at [place]  - US BREAST LTD UNI RIGHT INC AXILLA; Future - MM DIAG BREAST TOMO BILATERAL; Future - US BREAST LTD UNI LEFT INC AXILLA; Future  2. Discharge from left nipple Slide  sent to pathology of nipple discharge  - US BREAST LTD UNI RIGHT INC AXILLA; Future - MM DIAG BREAST TOMO BILATERAL; Future - US BREAST LTD UNI LEFT INC AXILLA; Future  3. Breast pain, has resolved, but tender on exam - US BREAST LTD UNI RIGHT INC AXILLA; Future - MM DIAG BREAST TOMO BILATERAL; Future - US BREAST LTD UNI LEFT INC AXILLA; Future  4. Vaginal discharge Self swab sent for BV and yeast, had recent negative tests for GC/CHL and trich - Cervicovaginal ancillary only( [place])     Plan:     Pap and physical about 02/18/22

## 2021-11-06 LAB — CERVICOVAGINAL ANCILLARY ONLY
Bacterial Vaginitis (gardnerella): NEGATIVE
Candida Glabrata: NEGATIVE
Candida Vaginitis: NEGATIVE
Comment: NEGATIVE
Comment: NEGATIVE
Comment: NEGATIVE

## 2021-11-06 LAB — CYTOLOGY - NON PAP

## 2021-11-13 ENCOUNTER — Other Ambulatory Visit: Payer: Self-pay

## 2021-11-13 ENCOUNTER — Emergency Department (HOSPITAL_COMMUNITY)
Admission: EM | Admit: 2021-11-13 | Discharge: 2021-11-13 | Discharge status: Against Medical Advice | Payer: BC Managed Care – PPO | Attending: Emergency Medicine | Admitting: Emergency Medicine

## 2021-11-13 ENCOUNTER — Encounter (HOSPITAL_COMMUNITY): Payer: Self-pay | Admitting: Emergency Medicine

## 2021-11-13 ENCOUNTER — Emergency Department (HOSPITAL_COMMUNITY): Payer: BC Managed Care – PPO

## 2021-11-13 ENCOUNTER — Telehealth: Payer: Self-pay | Admitting: Cardiology

## 2021-11-13 DIAGNOSIS — R079 Chest pain, unspecified: Secondary | ICD-10-CM | POA: Insufficient documentation

## 2021-11-13 DIAGNOSIS — R0600 Dyspnea, unspecified: Secondary | ICD-10-CM | POA: Insufficient documentation

## 2021-11-13 DIAGNOSIS — R0602 Shortness of breath: Secondary | ICD-10-CM | POA: Insufficient documentation

## 2021-11-13 DIAGNOSIS — R000 Tachycardia, unspecified: Secondary | ICD-10-CM | POA: Diagnosis not present

## 2021-11-13 DIAGNOSIS — I509 Heart failure, unspecified: Secondary | ICD-10-CM | POA: Diagnosis not present

## 2021-11-13 DIAGNOSIS — R0981 Nasal congestion: Secondary | ICD-10-CM | POA: Diagnosis not present

## 2021-11-13 DIAGNOSIS — R52 Pain, unspecified: Secondary | ICD-10-CM | POA: Diagnosis not present

## 2021-11-13 DIAGNOSIS — I10 Essential (primary) hypertension: Secondary | ICD-10-CM | POA: Diagnosis not present

## 2021-11-13 DIAGNOSIS — R059 Cough, unspecified: Secondary | ICD-10-CM | POA: Insufficient documentation

## 2021-11-13 DIAGNOSIS — R0789 Other chest pain: Secondary | ICD-10-CM | POA: Diagnosis not present

## 2021-11-13 DIAGNOSIS — R55 Syncope and collapse: Secondary | ICD-10-CM | POA: Diagnosis not present

## 2021-11-13 DIAGNOSIS — Z5321 Procedure and treatment not carried out due to patient leaving prior to being seen by health care provider: Secondary | ICD-10-CM | POA: Diagnosis not present

## 2021-11-13 LAB — BASIC METABOLIC PANEL
Anion gap: 11 (ref 5–15)
BUN: 6 mg/dL (ref 6–20)
CO2: 21 mmol/L — ABNORMAL LOW (ref 22–32)
Calcium: 10.1 mg/dL (ref 8.9–10.3)
Chloride: 105 mmol/L (ref 98–111)
Creatinine, Ser: <0.3 mg/dL — ABNORMAL LOW (ref 0.44–1.00)
Glucose, Bld: 86 mg/dL (ref 70–99)
Potassium: 3.7 mmol/L (ref 3.5–5.1)
Sodium: 137 mmol/L (ref 135–145)

## 2021-11-13 LAB — CBC
HCT: 36.8 % (ref 36.0–46.0)
Hemoglobin: 11.4 g/dL — ABNORMAL LOW (ref 12.0–15.0)
MCH: 24.8 pg — ABNORMAL LOW (ref 26.0–34.0)
MCHC: 31 g/dL (ref 30.0–36.0)
MCV: 80 fL (ref 80.0–100.0)
Platelets: 278 10*3/uL (ref 150–400)
RBC: 4.6 MIL/uL (ref 3.87–5.11)
RDW: 15.3 % (ref 11.5–15.5)
WBC: 11.1 10*3/uL — ABNORMAL HIGH (ref 4.0–10.5)
nRBC: 0 % (ref 0.0–0.2)

## 2021-11-13 LAB — BRAIN NATRIURETIC PEPTIDE: B Natriuretic Peptide: 389.7 pg/mL — ABNORMAL HIGH (ref 0.0–100.0)

## 2021-11-13 LAB — I-STAT BETA HCG BLOOD, ED (MC, WL, AP ONLY): I-stat hCG, quantitative: <5 m[IU]/mL (ref <5)

## 2021-11-13 NOTE — Telephone Encounter (Signed)
Patient sob, having chest pain, "not feeling good at all, sick at my stomach". She is alone, does not have order for NTG. Advised her to hang up with me and call 911 to take her to the hospital. She stated, "Okay."

## 2021-11-13 NOTE — Telephone Encounter (Signed)
  Pt c/o of Chest Pain: STAT if CP now or developed within 24 hours  1. Are you having CP right now? yes  2. Are you experiencing any other symptoms (ex. SOB, nausea, vomiting, sweating)? sob  3. How long have you been experiencing CP? Yesterday   4. Is your CP continuous or coming and going? continuous   5. Have you taken Nitroglycerin? No   Pt is having cp and sob with her cold  ?

## 2021-11-13 NOTE — ED Provider Triage Note (Signed)
Emergency Medicine Provider Triage Evaluation Note  Pamela [name] , a 36 y.o. female  was evaluated in triage.  Pt complains of chest pain and shortness of breath for past several days.  She was recently diagnosed with congestive heart failure, and states that the symptoms feel similarly to when she was diagnosed.  She has been having a productive cough, nasal congestion, chest pain and difficulty breathing.  She also was in feeling very anxious.  Review of Systems  Positive: As above Negative: Fever, chills, syncope  Physical Exam  BP (!) 145/72 (BP Location: Right Arm)   Pulse (!) 126   Temp 100 F (37.8 C) (Oral)   Resp (!) 26   LMP 10/19/2021   SpO2 96%  Gen:   Awake, no distress   Resp:  Normal effort  MSK:   Moves extremities without difficulty  Other:  Tachypneic  Medical Decision Making  Medically screening exam initiated at 3:17 PM.  Appropriate orders placed.  [name] was informed that the remainder of the evaluation will be completed by another provider, this initial triage assessment does not replace that evaluation, and the importance of remaining in the ED until their evaluation is complete.  Unsure if tachycardia and tachypnea is related to anxiety, infection or CHF exacerbation. Will order labs accordingly   Franco Duley T, PA-C 11/13/21 1520

## 2021-11-13 NOTE — ED Notes (Signed)
Pt called many times for vitals w/o response

## 2021-11-13 NOTE — ED Triage Notes (Signed)
Pt arrives via EMS for cp/sob. Pt recently diagnosed with CHF. Pt states this is how she felt when they first diagnosed her. Pain 8/10. BP 167/83, HR 122, 93% on room air.

## 2021-11-14 ENCOUNTER — Emergency Department (HOSPITAL_BASED_OUTPATIENT_CLINIC_OR_DEPARTMENT_OTHER)
Admission: EM | Admit: 2021-11-14 | Discharge: 2021-11-14 | Disposition: A | Discharge status: Home / Self Care | Payer: BC Managed Care – PPO | Attending: Emergency Medicine | Admitting: Emergency Medicine

## 2021-11-14 ENCOUNTER — Encounter (HOSPITAL_BASED_OUTPATIENT_CLINIC_OR_DEPARTMENT_OTHER): Payer: Self-pay

## 2021-11-14 DIAGNOSIS — Z91040 Latex allergy status: Secondary | ICD-10-CM | POA: Insufficient documentation

## 2021-11-14 DIAGNOSIS — R7989 Other specified abnormal findings of blood chemistry: Secondary | ICD-10-CM | POA: Insufficient documentation

## 2021-11-14 DIAGNOSIS — R000 Tachycardia, unspecified: Secondary | ICD-10-CM | POA: Insufficient documentation

## 2021-11-14 DIAGNOSIS — I509 Heart failure, unspecified: Secondary | ICD-10-CM | POA: Diagnosis not present

## 2021-11-14 DIAGNOSIS — R0602 Shortness of breath: Secondary | ICD-10-CM | POA: Diagnosis not present

## 2021-11-14 DIAGNOSIS — B349 Viral infection, unspecified: Secondary | ICD-10-CM | POA: Insufficient documentation

## 2021-11-14 MED ORDER — BENZONATATE 100 MG PO CAPS: 100.0000 mg | ORAL_CAPSULE | Freq: Three times a day (TID) | ORAL | 0 refills | Status: DC

## 2021-11-14 NOTE — ED Provider Notes (Signed)
[facility] EMERGENCY DEPT Provider Note   CSN: [id number] Arrival date & time: 11/14/21  0023     History  Chief Complaint  Patient presents with   Shortness of Breath    Pamela [name] is a 36 y.o. female.  36 yo F with a chief complaint of difficulty breathing.  Patient is concerned because it feels like when she had a heart failure exacerbation in the past.  She has had no other signs consistent with her heart failure typically she get swelling in her legs and her abdomen which she has not appreciated.  Has been taking her Lasix as prescribed.  She has however been coughing quite a bit and having subjective fevers off and on.  She is unsure of any sick contacts.   Shortness of Breath      Home Medications Prior to Admission medications   Medication Sig Start Date End Date Taking? Authorizing Provider  benzonatate (TESSALON) 100 MG capsule Take 1 capsule (100 mg total) by mouth every 8 (eight) hours. 11/14/21  Yes Deno Etienne, DO  Aspirin-Acetaminophen (GOODYS BODY PAIN PO) Take 1 packet by mouth 2 (two) times daily as needed (headache/pain).    [provider]  furosemide (LASIX) 40 MG tablet Take 1 tablet (40 mg total) by mouth 2 (two) times daily. 09/20/21   Tobb, Kardie, DO  irbesartan (AVAPRO) 75 MG tablet Take 0.5 tablets (37.5 mg total) by mouth daily. 09/06/21 10/06/21  Arrien, Jimmy Picket, MD  LYSINE PO Take 1 tablet by mouth daily.    [provider]  methimazole (TAPAZOLE) 10 MG tablet Take 1 tablet (10 mg total) by mouth daily. 09/06/21 10/06/21  Arrien, Jimmy Picket, MD  methimazole (TAPAZOLE) 10 MG tablet Take 10 mg by mouth 3 (three) times daily.    [provider]  Multiple Vitamin (MULTIVITAMIN) tablet Take 1 tablet by mouth daily.    [provider]  Multiple Vitamins-Minerals (HAIR SKIN AND NAILS FORMULA) TABS Take 1 tablet by mouth daily.    [provider]  OREGANO PO Take 1 capsule by mouth daily.     [provider]  potassium chloride SA (KLOR-CON M) 20 MEQ tablet Take 1 tablet (20 mEq total) by mouth daily. 09/24/21   Tobb, Kardie, DO  propranolol (INDERAL) 20 MG tablet Take 1 tablet (20 mg total) by mouth 3 (three) times daily. 09/24/21   Tobb, Kardie, DO  valACYclovir (VALTREX) 1000 MG tablet Take 1 tablet (1,000 mg total) by mouth 2 (two) times daily. Patient taking differently: Take 1,000 mg by mouth 2 (two) times daily as needed (outbreak). 01/12/21   Estill Dooms, NP      Allergies    Ceftriaxone, Lactose intolerance (gi), Latex, and Trazodone and nefazodone    Review of Systems   Review of Systems  Respiratory:  Positive for shortness of breath.     Physical Exam Updated Vital Signs BP (!) 146/52   Pulse (!) 108   Temp 98.1 F (36.7 C) (Oral)   Resp (!) 24   Ht 5\' 10"  (1.778 m)   Wt 133.8 kg   LMP 11/07/2021 (Exact Date)   SpO2 96%   BMI 42.33 kg/m  Physical Exam Vitals and nursing note reviewed.  Constitutional:      General: She is not in acute distress.    Appearance: She is well-developed. She is not diaphoretic.  HENT:     Head: Normocephalic and atraumatic.  Eyes:     Pupils: Pupils are  equal, round, and reactive to light.  Neck:     Vascular: No JVD.  Cardiovascular:     Rate and Rhythm: Normal rate and regular rhythm.     Heart sounds: No murmur heard.    No friction rub. No gallop.  Pulmonary:     Effort: Pulmonary effort is normal.     Breath sounds: No wheezing or rales.  Abdominal:     General: There is no distension.     Palpations: Abdomen is soft.     Tenderness: There is no abdominal tenderness.  Musculoskeletal:        General: No tenderness.     Cervical back: Normal range of motion and neck supple.  Skin:    General: Skin is warm and dry.  Neurological:     Mental Status: She is alert and oriented to person, place, and time.  Psychiatric:        Behavior: Behavior normal.     ED Results / Procedures /  Treatments   Labs (all labs ordered are listed, but only abnormal results are displayed) Labs Reviewed - No data to display  EKG EKG Interpretation  Date/Time:  Wednesday November 14 2021 00:43:53 EDT Ventricular Rate:  111 PR Interval:  148 QRS Duration: 86 QT Interval:  388 QTC Calculation: 527 R Axis:   63 Text Interpretation: Sinus tachycardia Prolonged QT Abnormal ECG No significant change since last tracing Confirmed by Deno Etienne [phone]) on 11/14/2021 12:50:06 AM  Radiology DG Chest 2 View  Result Date: 11/13/2021 CLINICAL DATA:  Chest pain, shortness of breath, syncope EXAM: CHEST - 2 VIEW COMPARISON:  09/02/2021 FINDINGS: Transverse diameter of heart is slightly increased. There are no signs of alveolar pulmonary edema or focal pulmonary consolidation. There is mild peribronchial thickening. There is no pleural effusion or pneumothorax. IMPRESSION: Peribronchial thickening suggests bronchitis. There are no new focal infiltrates. Electronically Signed   By: Elmer Picker M.D.   On: 11/13/2021 15:52    Procedures Procedures    Medications Ordered in ED Medications - No data to display  ED Course/ Medical Decision Making/ A&P                           Medical Decision Making Risk Prescription drug management.   36 yo F with a chief complaint of difficulty breathing.  This been going on for a few days now.  Having cough and subjective fevers and chills.  She is concerned that she might be having a heart failure exacerbation.    She was actually in the emergency department last night and had left prior to being seen.  She did have time to get a chest x-ray and blood work performed.  Her BNP is mildly elevated, no significant electrolyte abnormality, anemia at baseline.  I suspect this is more likely viral than heart failure based on the history and exam.  We will treat supportively.  PCP follow-up.  2:22 AM:  I have discussed the diagnosis/risks/treatment options with  the patient.  Evaluation and diagnostic testing in the emergency department does not suggest an emergent condition requiring admission or immediate intervention beyond what has been performed at this time.  They will follow up with  PCP. We also discussed returning to the ED immediately if new or worsening sx occur. We discussed the sx which are most concerning (e.g., sudden worsening pain, fever, inability to tolerate by mouth) that necessitate immediate return. Medications administered to the patient  during their visit and any new prescriptions provided to the patient are listed below.  Medications given during this visit Medications - No data to display   The patient appears reasonably screen and/or stabilized for discharge and I doubt any other medical condition or other [facility] requiring further screening, evaluation, or treatment in the ED at this time prior to discharge.          Final Clinical Impression(s) / ED Diagnoses Final diagnoses:  Acute viral syndrome    Rx / DC Orders ED Discharge Orders          Ordered    benzonatate (TESSALON) 100 MG capsule  Every 8 hours        11/14/21 0148              Deno Etienne, DO 11/14/21 0222

## 2021-11-14 NOTE — Discharge Instructions (Signed)
Please return for worsening symptoms.  Tylenol as needed for fevers and chills.  You may need to increase your fluid intake slightly.  Follow-up with your family doctor in the office.

## 2021-11-14 NOTE — ED Triage Notes (Signed)
Pt arrives POV from home, was seen at [facility] ED earlier but LWBS.  Pt states recently diagnosed with CHF.  She states she feels short of breath and that her lungs are filling up with fluid.  BIB wheelchair, appears anxious in triage.  A&Ox4. GCS 15.

## 2021-11-15 ENCOUNTER — Telehealth: Payer: Self-pay

## 2021-11-15 NOTE — Telephone Encounter (Signed)
Transition Care Management Follow-up Telephone Call Date of discharge and from where: 11/14/2021 from [facility] How have you been since you were released from the hospital? Patient stated that she is still feeling bad. Patient stated that the benzonatate. Patient stated that she had sharpness in her chest today that radiated to her stomach, however those sx have resolved so far. Patient also mention that the cough and feeling in her chest is similar to when she was dx with CHF. Patient stated "it feels like I am drowning". Patient stated that she was seen at [facility] on the 13th of June but eloped bc of the wait time. Patient did have labs and ray done at [facility]. Radiology result stated possible bronchitis. Patient stated that she wanted to be reevaluated and I suggested [facility].  Any questions or concerns? No  Items Reviewed: Did the pt receive and understand the discharge instructions provided? Yes  Medications obtained and verified? Yes  Other? No  Any new allergies since your discharge? No  Dietary orders reviewed? No Do you have support at home? Yes   Functional Questionnaire: (I = Independent and D = Dependent) ADLs: I  Bathing/Dressing- I  Meal Prep- I  Eating- I but no appetite  Maintaining continence- I  Transferring/Ambulation- I  Managing Meds- I   Follow up appointments reviewed:  PCP Hospital f/u appt confirmed? No   Specialist Hospital f/u appt confirmed? No   Are transportation arrangements needed? No  If their condition worsens, is the pt aware to call PCP or go to the Emergency Dept.? Yes Was the patient provided with contact information for the PCP's office or ED? Yes Was to pt encouraged to call back with questions or concerns? Yes

## 2021-11-20 ENCOUNTER — Ambulatory Visit (HOSPITAL_COMMUNITY): Payer: BC Managed Care – PPO

## 2021-11-20 ENCOUNTER — Inpatient Hospital Stay (HOSPITAL_COMMUNITY): Admission: RE | Admit: 2021-11-20 | Payer: BC Managed Care – PPO | Source: Ambulatory Visit

## 2021-11-29 NOTE — Progress Notes (Signed)
Pt had vaginal discharge, she said and did self swab to rule out yeast and BV.

## 2021-12-06 ENCOUNTER — Encounter (HOSPITAL_COMMUNITY): Payer: BC Managed Care – PPO

## 2021-12-06 ENCOUNTER — Ambulatory Visit (HOSPITAL_COMMUNITY): Payer: BC Managed Care – PPO

## 2022-02-11 ENCOUNTER — Other Ambulatory Visit (HOSPITAL_BASED_OUTPATIENT_CLINIC_OR_DEPARTMENT_OTHER): Payer: Self-pay

## 2022-02-11 ENCOUNTER — Encounter (HOSPITAL_BASED_OUTPATIENT_CLINIC_OR_DEPARTMENT_OTHER): Payer: Self-pay

## 2022-02-11 ENCOUNTER — Emergency Department (HOSPITAL_BASED_OUTPATIENT_CLINIC_OR_DEPARTMENT_OTHER)
Admission: EM | Admit: 2022-02-11 | Discharge: 2022-02-11 | Disposition: A | Discharge status: Home / Self Care | Payer: BC Managed Care – PPO | Attending: Emergency Medicine | Admitting: Emergency Medicine

## 2022-02-11 ENCOUNTER — Other Ambulatory Visit: Payer: Self-pay

## 2022-02-11 DIAGNOSIS — Z113 Encounter for screening for infections with a predominantly sexual mode of transmission: Secondary | ICD-10-CM | POA: Insufficient documentation

## 2022-02-11 DIAGNOSIS — Z91040 Latex allergy status: Secondary | ICD-10-CM | POA: Diagnosis not present

## 2022-02-11 LAB — WET PREP, GENITAL
Clue Cells Wet Prep HPF POC: NONE SEEN
Sperm: NONE SEEN
Trich, Wet Prep: NONE SEEN
WBC, Wet Prep HPF POC: <10 (ref <10)
Yeast Wet Prep HPF POC: NONE SEEN

## 2022-02-11 LAB — URINALYSIS, ROUTINE W REFLEX MICROSCOPIC
Bilirubin Urine: NEGATIVE
Glucose, UA: NEGATIVE mg/dL
Hgb urine dipstick: NEGATIVE
Ketones, ur: NEGATIVE mg/dL
Leukocytes,Ua: NEGATIVE
Nitrite: NEGATIVE
Protein, ur: NEGATIVE mg/dL
Specific Gravity, Urine: 1.025 (ref 1.005–1.030)
pH: 5.5 (ref 5.0–8.0)

## 2022-02-11 LAB — PREGNANCY, URINE: Preg Test, Ur: NEGATIVE

## 2022-02-11 MED ORDER — DOXYCYCLINE HYCLATE 100 MG PO CAPS
100.0000 mg | ORAL_CAPSULE | Freq: Two times a day (BID) | ORAL | 0 refills | Status: DC
  Filled 2022-02-11: qty 14, 7d supply, fill #0

## 2022-02-11 NOTE — ED Provider Notes (Signed)
[facility] EMERGENCY DEPT Provider Note   CSN: [id number] Arrival date & time: 02/11/22  1347     History  Chief Complaint  Patient presents with   Exposure to STD    Pamela [name] is a 36 y.o. female.  Patient presents to the emergency department today for sexual transmitted infection screening.  She reports recent contact.  She denies symptoms including vaginal discharge or pain.  She denies dysuria, increased frequency or urgency.  No lower abdominal or pelvic pain.  Requesting testing and prophylactic treatment if possible.       Home Medications Prior to Admission medications   Medication Sig Start Date End Date Taking? Authorizing Provider  doxycycline (VIBRAMYCIN) 100 MG capsule Take 1 capsule (100 mg total) by mouth 2 (two) times daily. 02/11/22  Yes Renne Crigler, PA-C  Aspirin-Acetaminophen (GOODYS BODY PAIN PO) Take 1 packet by mouth 2 (two) times daily as needed (headache/pain).    [provider]  benzonatate (TESSALON) 100 MG capsule Take 1 capsule (100 mg total) by mouth every 8 (eight) hours. 11/14/21   Melene Plan, DO  furosemide (LASIX) 40 MG tablet Take 1 tablet (40 mg total) by mouth 2 (two) times daily. 09/20/21   Tobb, Kardie, DO  irbesartan (AVAPRO) 75 MG tablet Take 0.5 tablets (37.5 mg total) by mouth daily. 09/06/21 10/06/21  Arrien, York Ram, MD  LYSINE PO Take 1 tablet by mouth daily.    [provider]  methimazole (TAPAZOLE) 10 MG tablet Take 1 tablet (10 mg total) by mouth daily. 09/06/21 10/06/21  Arrien, York Ram, MD  methimazole (TAPAZOLE) 10 MG tablet Take 10 mg by mouth 3 (three) times daily.    [provider]  Multiple Vitamin (MULTIVITAMIN) tablet Take 1 tablet by mouth daily.    [provider]  Multiple Vitamins-Minerals (HAIR SKIN AND NAILS FORMULA) TABS Take 1 tablet by mouth daily.    [provider]  OREGANO PO Take 1 capsule by mouth daily.    [provider]   potassium chloride SA (KLOR-CON M) 20 MEQ tablet Take 1 tablet (20 mEq total) by mouth daily. 09/24/21   Tobb, Kardie, DO  propranolol (INDERAL) 20 MG tablet Take 1 tablet (20 mg total) by mouth 3 (three) times daily. 09/24/21   Tobb, Kardie, DO  valACYclovir (VALTREX) 1000 MG tablet Take 1 tablet (1,000 mg total) by mouth 2 (two) times daily. Patient taking differently: Take 1,000 mg by mouth 2 (two) times daily as needed (outbreak). 01/12/21   Adline Potter, NP      Allergies    Ceftriaxone, Lactose intolerance (gi), Latex, and Trazodone and nefazodone    Review of Systems   Review of Systems  Physical Exam Updated Vital Signs BP (!) 168/83   Pulse (!) 121   Temp 98.4 F (36.9 C)   Resp 20   Ht 5\' 10"  (1.778 m)   Wt 133.8 kg   SpO2 100%   BMI 42.32 kg/m   Physical Exam Vitals and nursing note reviewed.  Constitutional:      Appearance: She is well-developed.  HENT:     Head: Normocephalic and atraumatic.  Eyes:     Conjunctiva/sclera: Conjunctivae normal.  Pulmonary:     Effort: No respiratory distress.  Abdominal:     Tenderness: There is no abdominal tenderness. There is no guarding or rebound.  Musculoskeletal:     Cervical back: Normal range of motion and neck supple.  Skin:    General: Skin  is warm and dry.  Neurological:     Mental Status: She is alert.     ED Results / Procedures / Treatments   Labs (all labs ordered are listed, but only abnormal results are displayed) Labs Reviewed  WET PREP, GENITAL  URINALYSIS, ROUTINE W REFLEX MICROSCOPIC  PREGNANCY, URINE  HIV ANTIBODY (ROUTINE TESTING W REFLEX)  RPR  GC/CHLAMYDIA PROBE AMP () NOT AT [facility]    EKG None  Radiology No results found.  Procedures Procedures    Medications Ordered in ED Medications - No data to display  ED Course/ Medical Decision Making/ A&P    Patient seen and examined. History obtained directly from patient.   Labs/EKG: Independently reviewed and  interpreted.  This included: UA and urine pregnancy which are negative.  Wet prep which is negative.  Testing for GC and chlamydia is pending as well as RPR and HIV.  Imaging: None  Medications/Fluids: None  Most recent vital signs reviewed and are as follows: BP (!) 168/83   Pulse (!) 121   Temp 98.4 F (36.9 C)   Resp 20   Ht 5\' 10"  (1.778 m)   Wt 133.8 kg   SpO2 100%   BMI 42.32 kg/m   Initial impression: Encounter for sexual transmitted infection screening.  Home treatment plan: Prescription given for doxycycline.  Patient has allergy to Rocephin.  I would prefer that she wait until testing results to determine if she needs treatment for this.  Discussed that if she test negative for chlamydia, she can discard the doxycycline.  Return instructions discussed with patient: Return with worsening or other concerns  Follow-up instructions discussed with patient: Follow-up with PCP or health department.                          Medical Decision Making Amount and/or Complexity of Data Reviewed Labs: ordered.  Risk Prescription drug management.   Asymptomatic screening for STI        Final Clinical Impression(s) / ED Diagnoses Final diagnoses:  Routine screening for STI (sexually transmitted infection)    Rx / DC Orders ED Discharge Orders          Ordered    doxycycline (VIBRAMYCIN) 100 MG capsule  2 times daily        02/11/22 1638              04/13/22, PA-C 02/11/22 1641    04/13/22, MD 02/12/22 0109

## 2022-02-11 NOTE — Discharge Instructions (Signed)
Please read and follow all provided instructions.  Your diagnoses today include:  1. Routine screening for STI (sexually transmitted infection)     Tests performed today include: Test for gonorrhea and chlamydia: This will result in about 48 hours and be available in Mychart Test for HIV and syphilis.  Urine test: No sign of urinary tract infection Wet prep: No sign of yeast infection, bacterial vaginosis, trichomonas Vital signs. See below for your results today.   Medications:  For treatment of gonorrhea: Not treating today due to Rocephin allergy.  If you were to test positive for gonorrhea, you would receive a phone call with further instructions on how to get this treated.  For treatment of chlamydia: Please hold the prescription for doxycycline until the results return.  If you test positive for chlamydia you should fill the doxycycline and take the entire prescription.  If you test negative, you can discard the prescription. You can check your results on the internet through your MyChart. Results typically return in 48 hours.   Home care instructions:  Read educational materials contained in this packet and follow any instructions provided.   If you test positive, you should tell your partners about your infection and avoid having sex for one week to allow time for the medicine to work.  Sexually transmitted disease testing also available at:  [facility] [place] [place], [facility] [address] [place], phone [phone] or [phone]   Monday - Friday, call for an appointment  Return instructions:  Please return to the Emergency Department if you experience worsening symptoms.  Please return if you have any other emergent concerns.  Additional Information:  Your vital signs today were: BP (!) 168/83   Pulse (!) 121   Temp 98.4 F (36.9 C)   Resp 20   Ht 5\' 10"  (1.778 m)   Wt 133.8 kg   SpO2 100%   BMI 42.32 kg/m  If your  blood pressure (BP) was elevated above 135/85 this visit, please have this repeated by your doctor within one month. --------------

## 2022-02-11 NOTE — ED Triage Notes (Signed)
Patient here POV from Home.  Endorses being on Vacation and having Sexual Encounter with Unknown Person. Seeks Testing and Treatment for STI.   No Symptoms. No Dysuria. No Bleeding.   NAD Noted during Triage. A&Ox4. GCS 15. Ambulatory

## 2022-02-12 LAB — GC/CHLAMYDIA PROBE AMP (~~LOC~~) NOT AT ARMC
Chlamydia: POSITIVE — AB
Comment: NEGATIVE
Comment: NORMAL
Neisseria Gonorrhea: NEGATIVE

## 2022-02-12 LAB — RPR: RPR Ser Ql: NONREACTIVE

## 2022-02-12 LAB — HIV ANTIBODY (ROUTINE TESTING W REFLEX): HIV Screen 4th Generation wRfx: NONREACTIVE

## 2022-02-18 ENCOUNTER — Ambulatory Visit (INDEPENDENT_AMBULATORY_CARE_PROVIDER_SITE_OTHER): Payer: BC Managed Care – PPO | Admitting: Adult Health

## 2022-02-18 ENCOUNTER — Other Ambulatory Visit (HOSPITAL_COMMUNITY)
Admission: RE | Admit: 2022-02-18 | Discharge: 2022-02-18 | Disposition: A | Discharge status: Home / Self Care | Payer: BC Managed Care – PPO | Source: Ambulatory Visit | Attending: Adult Health | Admitting: Adult Health

## 2022-02-18 ENCOUNTER — Encounter: Payer: Self-pay | Admitting: Adult Health

## 2022-02-18 VITALS — BP 138/86 | HR 92 | Ht 70.0 in | Wt 280.5 lb

## 2022-02-18 DIAGNOSIS — F32A Depression, unspecified: Secondary | ICD-10-CM | POA: Diagnosis not present

## 2022-02-18 DIAGNOSIS — R87810 Cervical high risk human papillomavirus (HPV) DNA test positive: Secondary | ICD-10-CM

## 2022-02-18 DIAGNOSIS — Z01419 Encounter for gynecological examination (general) (routine) without abnormal findings: Secondary | ICD-10-CM | POA: Insufficient documentation

## 2022-02-18 DIAGNOSIS — F419 Anxiety disorder, unspecified: Secondary | ICD-10-CM | POA: Diagnosis not present

## 2022-02-18 MED ORDER — ESCITALOPRAM OXALATE 10 MG PO TABS: 10.0000 mg | ORAL_TABLET | Freq: Every day | ORAL | 2 refills | Status: DC

## 2022-02-18 NOTE — Progress Notes (Signed)
Patient ID: Pamela [name], female   DOB: Sep 13, 1985, 36 y.o.   MRN: [id number] History of Present Illness: Pamela [name] is a 36 year old black female,with EN,[id number], in for a well woman gyn exam and pap, her pap 02/16/21 was negative malignancy but +HPV. She says she is not sleeping well, only about 3 hours. She works third shift. Since having CHF she worries a lot.   Current Medications, Allergies, Past Medical History, Past Surgical History, Family History and Social History were reviewed in [employer] record.     Review of Systems: Patient denies any headaches, hearing loss, fatigue, blurred vision, shortness of breath, chest pain, abdominal pain, problems with bowel movements, urination, or intercourse. No joint pain or mood swings.  See HPV for positives   Physical Exam:BP 138/86 (BP Location: Left Arm, Patient Position: Sitting, Cuff Size: Normal)   Pulse 92   Ht 5\' 10"  (1.778 m)   Wt 280 lb 8 oz (127.2 kg)   LMP 01/27/2022   BMI 40.25 kg/m   General:  Well developed, well nourished, no acute distress Skin:  Warm and dry Neck:  Midline trachea, normal thyroid, good ROM, no lymphadenopathy Lungs; Clear to auscultation bilaterally Breast:  No dominant palpable mass, retraction, or nipple discharge Cardiovascular: Regular rate and rhythm Abdomen:  Soft, non tender, no hepatosplenomegaly Pelvic:  External genitalia is normal in appearance, no lesions.  The vagina is normal in appearance. Urethra has no lesions or masses. The cervix is bulbous.Pap with GC/CHL and HR HPV genotyping performed.  Uterus is felt to be normal size, shape, and contour.  No adnexal masses or tenderness noted.Bladder is non tender, no masses felt. Extremities/musculoskeletal:  No swelling or varicosities noted, no clubbing or cyanosis Psych:  No mood changes, alert and cooperative,seems happy AA is 4 Fall risk is low    02/18/2022   11:46 AM 02/16/2021   12:43 PM 04/05/2019    3:12 PM   Depression screen PHQ 2/9  Decreased Interest 1 1 0  Down, Depressed, Hopeless 1 1 0  PHQ - 2 Score 2 2 0  Altered sleeping 3 3   Tired, decreased energy 1 1   Change in appetite 1 2   Feeling bad or failure about yourself  0 1   Trouble concentrating 1 1   Moving slowly or fidgety/restless 0 1   Suicidal thoughts 0 0   PHQ-9 Score 8 11        02/18/2022   11:46 AM 02/16/2021   12:43 PM  GAD 7 : Generalized Anxiety Score  Nervous, Anxious, on Edge 2 1  Control/stop worrying 1 1  Worry too much - different things 1 1  Trouble relaxing 1 1  Restless 0 1  Easily annoyed or irritable 2 1  Afraid - awful might happen 3 0  Total GAD 7 Score 10 6      Upstream - 02/18/22 1143       Pregnancy Intention Screening   Does the patient want to become pregnant in the next year? Yes    Does the patient's partner want to become pregnant in the next year? Yes    Would the patient like to discuss contraceptive options today? No      Contraception Wrap Up   Current Method Female Condom    End Method Female Condom             Examination chaperoned by Levy Pupa LPN  Impression and Plan: 1. Encounter for  gynecological examination with Papanicolaou smear of cervix Pap sent Pap in 3 years if normal Physical in 1 year  2. Anxiety and depression Teary today when talking about it Denies any SI or HI  +worries a lot,seems worse since having CHF(she sees cardiologist) Does not sleep well, about 3 hours Will try lexapro 10 mg daily  Consider counseling ' Meds ordered this encounter  Medications   escitalopram (LEXAPRO) 10 MG tablet    Sig: Take 1 tablet (10 mg total) by mouth daily.    Dispense:  30 tablet    Refill:  2    Order Specific Question:   Supervising Provider    Answer:   Tania Ade H [[id number]]   Follow up with me in 8 weeks for ROS  3. Papanicolaou smear of cervix with positive high risk human papilloma virus (HPV) test Pap sent

## 2022-02-21 ENCOUNTER — Telehealth: Payer: Self-pay | Admitting: Adult Health

## 2022-02-21 ENCOUNTER — Encounter: Payer: Self-pay | Admitting: Adult Health

## 2022-02-21 DIAGNOSIS — R87612 Low grade squamous intraepithelial lesion on cytologic smear of cervix (LGSIL): Secondary | ICD-10-CM | POA: Insufficient documentation

## 2022-02-21 LAB — CYTOLOGY - PAP
Chlamydia: NEGATIVE
Comment: NEGATIVE
Comment: NEGATIVE
Comment: NEGATIVE
Comment: NEGATIVE
Comment: NORMAL
HPV 16: NEGATIVE
HPV 18 / 45: NEGATIVE
High risk HPV: POSITIVE — AB
Neisseria Gonorrhea: NEGATIVE

## 2022-02-21 NOTE — Telephone Encounter (Signed)
Pt aware of abnormal pap and need for colpo, appt made

## 2022-03-05 ENCOUNTER — Encounter: Payer: Self-pay | Admitting: Women's Health

## 2022-03-05 ENCOUNTER — Ambulatory Visit (INDEPENDENT_AMBULATORY_CARE_PROVIDER_SITE_OTHER): Payer: BC Managed Care – PPO | Admitting: Women's Health

## 2022-03-05 ENCOUNTER — Other Ambulatory Visit (HOSPITAL_COMMUNITY)
Admission: RE | Admit: 2022-03-05 | Discharge: 2022-03-05 | Disposition: A | Discharge status: Home / Self Care | Payer: BC Managed Care – PPO | Source: Ambulatory Visit | Attending: Women's Health | Admitting: Women's Health

## 2022-03-05 DIAGNOSIS — R87612 Low grade squamous intraepithelial lesion on cytologic smear of cervix (LGSIL): Secondary | ICD-10-CM | POA: Diagnosis not present

## 2022-03-05 DIAGNOSIS — N870 Mild cervical dysplasia: Secondary | ICD-10-CM | POA: Diagnosis not present

## 2022-03-05 NOTE — Patient Instructions (Signed)
Colposcopy, Care After  The following information offers guidance on how to care for yourself after your procedure. Your health care provider may also give you more specific instructions. If you have problems or questions, contact your health care provider. What can I expect after the procedure? If you had a colposcopy without a biopsy, you can expect to feel fine right away after your procedure. However, you may have some spotting of blood for a few days. You can return to your normal activities. If you had a colposcopy with a biopsy, it is common after the procedure to have: Soreness and mild pain. These may last for a few days. Mild vaginal bleeding or discharge that is dark-colored and grainy. This may last for a few days. The discharge may be caused by a liquid (solution) that was used during the procedure. You may need to wear a sanitary pad during this time. Spotting of blood for at least 48 hours after the procedure. Follow these instructions at home: Medicines Take over-the-counter and prescription medicines only as told by your health care provider. Talk with your health care provider about what type of over-the-counter pain medicines and prescription medicines you can start to take again. It is especially important to talk with your health care provider if you take blood thinners. Activity Avoid using douche products, using tampons, and having sex for at least 3 days after the procedure or for as long as told by your health care provider. Return to your normal activities as told by your health care provider. Ask your health care provider what activities are safe for you. General instructions Ask your health care provider if you may take baths, swim, or use a hot tub. You may take showers. If you use birth control (contraception), continue to use it. Keep all follow-up visits. This is important. Contact a health care provider if: You have a fever or chills. You faint or feel  light-headed. Get help right away if: You have heavy bleeding from your vagina or pass blood clots. Heavy bleeding is bleeding that soaks through a sanitary pad in less than 1 hour. You have vaginal discharge that is abnormal, is yellow in color, or smells bad. This could be a sign of infection. You have severe pain or cramps in your lower abdomen that do not go away with medicine. Summary If you had a colposcopy without a biopsy, you can expect to feel fine right away, but you may have some spotting of blood for a few days. You can return to your normal activities. If you had a colposcopy with a biopsy, it is common to have mild pain for a few days and spotting for 48 hours after the procedure. Avoid using douche products, using tampons, and having sex for at least 3 days after the procedure or for as long as told by your health care provider. Get help right away if you have heavy bleeding, severe pain, or signs of infection. This information is not intended to replace advice given to you by your health care provider. Make sure you discuss any questions you have with your health care provider. Document Revised: 10/15/2020 Document Reviewed: 10/15/2020 Elsevier Patient Education  2023 Elsevier Inc.  

## 2022-03-05 NOTE — Progress Notes (Signed)
   COLPOSCOPY PROCEDURE NOTE Patient name: Pamela [name] MRN [id number]  Date of birth: [birth year] Subjective Findings:   Pamela [name] is a 36 y.o. [phone] African American female being seen today for a colposcopy. Indication: Abnormal pap on 02/18/22: LSIL w/ HRHPV positive: other (not 16, 18/45)  Prior cytology:  Date Result Procedure  02/16/21 NILM w/ HRHPV positive: other (not 16, 18/45) None              Patient's last menstrual period was 02/23/2022 (exact date). Contraception: condoms. Menopausal: no. Hysterectomy: no.   Smoker: former . Immunocompromised: no.   The risks and benefits were explained and informed consent was obtained, and written copy is in chart. Pertinent History Reviewed:   Reviewed past medical,surgical, social, obstetrical and family history.  Reviewed problem list, medications and allergies. Objective Findings & Procedure:   Vitals:   03/05/22 1052  BP: 129/77  Pulse: 90  Weight: 284 lb 2 oz (128.9 kg)  Height: 5\' 10"  (1.778 m)  Body mass index is 40.77 kg/m.  No results found for this or any previous visit (from the past 24 hour(s)).   Time out was performed.  Speculum placed in the vagina, cervix fully visualized. SCJ: not fully visualized. Cervix swabbed x 3 with acetic acid.  Acetowhitening present: No Cervix: no visible lesions, no mosaicism, no punctation, and no abnormal vasculature. Endocervical curettage performed and Cervical biopsies taken at 12 o'clock, hemostasis achieved w/ Monsels Vagina: vaginal colposcopy not performed Vulva: vulvar colposcopy not performed  Specimens: 2  Complications: none  Chaperone: Andrez Grime    Colposcopic Impression & Plan:   Normal colposcopy without lesions Plan: Post biopsy instructions given, Will notify patient of results when back, and Will base plan of care on pathology results and ASCCP guidelines  Return in about 1 year (around 03/06/2023) for Pap & physical.  [place],  WHNP-BC 03/05/2022 11:24 AM

## 2022-03-05 NOTE — Addendum Note (Signed)
Addended by: Jesusita Oka on: 03/05/2022 12:35 PM   Modules accepted: Orders

## 2022-03-06 LAB — SURGICAL PATHOLOGY

## 2022-03-21 ENCOUNTER — Other Ambulatory Visit: Payer: Self-pay | Admitting: Adult Health

## 2022-04-01 ENCOUNTER — Other Ambulatory Visit: Payer: Self-pay | Admitting: Adult Health

## 2022-04-02 ENCOUNTER — Other Ambulatory Visit: Payer: Self-pay | Admitting: Adult Health

## 2022-04-02 MED ORDER — SULFAMETHOXAZOLE-TRIMETHOPRIM 800-160 MG PO TABS: 1.0000 | ORAL_TABLET | Freq: Two times a day (BID) | ORAL | 0 refills | Status: DC

## 2022-04-02 MED ORDER — VALACYCLOVIR HCL 1 G PO TABS
ORAL_TABLET | ORAL | 3 refills | Status: DC
Start: 2022-04-02 — End: 2023-05-25

## 2022-04-15 ENCOUNTER — Ambulatory Visit: Payer: BC Managed Care – PPO | Admitting: Adult Health

## 2022-04-15 DIAGNOSIS — Z539 Procedure and treatment not carried out, unspecified reason: Secondary | ICD-10-CM

## 2022-07-03 ENCOUNTER — Encounter: Payer: Self-pay | Admitting: Advanced Practice Midwife

## 2022-07-03 ENCOUNTER — Ambulatory Visit (INDEPENDENT_AMBULATORY_CARE_PROVIDER_SITE_OTHER): Payer: BC Managed Care – PPO | Admitting: Advanced Practice Midwife

## 2022-07-03 VITALS — BP 135/90 | HR 77 | Wt 288.0 lb

## 2022-07-03 DIAGNOSIS — Z789 Other specified health status: Secondary | ICD-10-CM | POA: Diagnosis not present

## 2022-07-03 DIAGNOSIS — M545 Low back pain, unspecified: Secondary | ICD-10-CM | POA: Diagnosis not present

## 2022-07-03 DIAGNOSIS — N926 Irregular menstruation, unspecified: Secondary | ICD-10-CM

## 2022-07-03 LAB — POCT URINALYSIS DIPSTICK OB
Glucose, UA: NEGATIVE
Ketones, UA: NEGATIVE
Leukocytes, UA: NEGATIVE
Nitrite, UA: NEGATIVE
POC,PROTEIN,UA: NEGATIVE

## 2022-07-03 LAB — POCT URINE PREGNANCY: Preg Test, Ur: NEGATIVE

## 2022-07-03 MED ORDER — MEDROXYPROGESTERONE ACETATE 10 MG PO TABS: 10.0000 mg | ORAL_TABLET | Freq: Every day | ORAL | 0 refills | Status: DC

## 2022-07-03 NOTE — Progress Notes (Signed)
GYN VISIT Patient name: Pamela [name] MRN [id number]  Date of birth: Jun 24, 1985 Chief Complaint:   Abortion 1023, bled for 28 days (Intercourse caused blood clots and has not had period since); Contraception; std screen; and lower back and pelvic pain  History of Present Illness:   Pamela [name] is a 37 y.o. [phone] African-American female being seen today for bilat low/mid back pain. Doesn't think she's having dysuria; reports EAB in Oct 2023 with a month of bleeding which stopped just after Thanksgiving and no cycle since. No fever. Interested in IUD for contraception.     No LMP recorded (lmp unknown). The current method of family planning is none.  Last pap Sept 2023. Results were: LSIL w/ HRHPV positive: other (not 16, 18/45)     02/18/2022   11:46 AM 02/16/2021   12:43 PM 04/05/2019    3:12 PM  Depression screen PHQ 2/9  Decreased Interest 1 1 0  Down, Depressed, Hopeless 1 1 0  PHQ - 2 Score 2 2 0  Altered sleeping 3 3   Tired, decreased energy 1 1   Change in appetite 1 2   Feeling bad or failure about yourself  0 1   Trouble concentrating 1 1   Moving slowly or fidgety/restless 0 1   Suicidal thoughts 0 0   PHQ-9 Score 8 11         02/18/2022   11:46 AM 02/16/2021   12:43 PM  GAD 7 : Generalized Anxiety Score  Nervous, Anxious, on Edge 2 1  Control/stop worrying 1 1  Worry too much - different things 1 1  Trouble relaxing 1 1  Restless 0 1  Easily annoyed or irritable 2 1  Afraid - awful might happen 3 0  Total GAD 7 Score 10 6     Review of Systems:   Pertinent items are noted in HPI Denies fever/chills, dizziness, headaches, visual disturbances, fatigue, shortness of breath, chest pain, abdominal pain, vomiting, abnormal vaginal discharge/itching/odor/irritation, problems with periods, bowel movements, urination, or intercourse unless otherwise stated above.  Pertinent History Reviewed:  Reviewed past medical,surgical, social, obstetrical and family history.   Reviewed problem list, medications and allergies. Physical Assessment:   Vitals:   07/03/22 1412 07/03/22 1419  BP: (!) 137/90 (!) 135/90  Pulse: 77   Weight: 288 lb (130.6 kg)   Body mass index is 41.32 kg/m.       Physical Examination:   General appearance: alert, well appearing, and in no distress  Mental status: alert, oriented to person, place, and time  Skin: warm & dry   Cardiovascular: normal heart rate noted  Respiratory: normal respiratory effort, no distress  Abdomen: soft, non-tender   Pelvic: examination not indicated  Extremities: no edema    Results for orders placed or performed in visit on 07/03/22 (from the past 24 hour(s))  POCT urine pregnancy   Collection Time: 07/03/22  2:20 PM  Result Value Ref Range   Preg Test, Ur Negative Negative  POC Urinalysis Dipstick OB   Collection Time: 07/03/22  2:50 PM  Result Value Ref Range   Color, UA     Clarity, UA     Glucose, UA Negative Negative   Bilirubin, UA     Ketones, UA neg    Spec Grav, UA     Blood, UA trace    pH, UA     POC,PROTEIN,UA Negative Negative, Trace, Small (1+), Moderate (2+), Large (3+), 4+   Urobilinogen, UA  Nitrite, UA neg    Leukocytes, UA Negative Negative   Appearance     Odor      Assessment & Plan:  1) s/p EAB Oct 2023 without a cycle yet> having back/pelvic discomfort; given a Provera challenge  2) Desires contraception> has had an IUD before and wants that again- probably Liletta; to call with cycle and schedule  Meds:  Meds ordered this encounter  Medications   medroxyPROGESTERone (PROVERA) 10 MG tablet    Sig: Take 1 tablet (10 mg total) by mouth daily.    Dispense:  10 tablet    Refill:  0    Order Specific Question:   Supervising Provider    Answer:   Janyth Pupa [[id number]]    Orders Placed This Encounter  Procedures   Urine Culture   GC/Chlamydia Probe Amp   POCT urine pregnancy   POC Urinalysis Dipstick OB    Return for will call with cycle to  set up Liletta insertion.  Myrtis Ser CNM 07/03/2022 3:22 PM

## 2022-07-05 LAB — GC/CHLAMYDIA PROBE AMP
Chlamydia trachomatis, NAA: NEGATIVE
Neisseria Gonorrhoeae by PCR: NEGATIVE

## 2022-07-06 LAB — URINE CULTURE

## 2022-08-15 ENCOUNTER — Other Ambulatory Visit: Payer: BC Managed Care – PPO

## 2022-10-23 ENCOUNTER — Ambulatory Visit (INDEPENDENT_AMBULATORY_CARE_PROVIDER_SITE_OTHER): Payer: BC Managed Care – PPO | Admitting: Adult Health

## 2022-10-23 ENCOUNTER — Other Ambulatory Visit (HOSPITAL_COMMUNITY)
Admission: RE | Admit: 2022-10-23 | Discharge: 2022-10-23 | Disposition: A | Discharge status: Home / Self Care | Payer: BC Managed Care – PPO | Source: Ambulatory Visit | Attending: Adult Health | Admitting: Adult Health

## 2022-10-23 ENCOUNTER — Encounter: Payer: Self-pay | Admitting: Adult Health

## 2022-10-23 VITALS — BP 140/80 | HR 75 | Ht 70.0 in | Wt 300.2 lb

## 2022-10-23 DIAGNOSIS — Z113 Encounter for screening for infections with a predominantly sexual mode of transmission: Secondary | ICD-10-CM | POA: Diagnosis not present

## 2022-10-23 DIAGNOSIS — Z3202 Encounter for pregnancy test, result negative: Secondary | ICD-10-CM | POA: Diagnosis not present

## 2022-10-23 DIAGNOSIS — Z3009 Encounter for other general counseling and advice on contraception: Secondary | ICD-10-CM | POA: Insufficient documentation

## 2022-10-23 LAB — POCT URINE PREGNANCY: Preg Test, Ur: NEGATIVE

## 2022-10-23 NOTE — Progress Notes (Signed)
  Subjective:     Patient ID: Pamela [name], female   DOB: 1986-04-20, 37 y.o.   MRN: [id number]  HPI Pamela [name] is a 37 year old black female,single, [id number] in requesting STD testing and discuss birth control.     Component Value Date/Time   DIAGPAP - Low grade squamous intraepithelial lesion (LSIL) (A) 02/18/2022 1145   DIAGPAP  02/16/2021 1248    - Negative for Intraepithelial Lesions or Malignancy (NILM)   HPVHIGH Positive (A) 02/18/2022 1145   HPVHIGH Positive (A) 02/16/2021 1248   ADEQPAP  02/18/2022 1145    Satisfactory for evaluation; transformation zone component PRESENT.   ADEQPAP  02/16/2021 1248    Satisfactory for evaluation; transformation zone component ABSENT.  Colpo 03/05/22 CIN 1  Review of Systems Periods more regular No sex lately Reviewed past medical,surgical, social and family history. Reviewed medications and allergies.     Objective:   Physical Exam BP (!) 140/80 (BP Location: Right Arm, Patient Position: Sitting, Cuff Size: Large)   Pulse 75   Ht 5\' 10"  (1.778 m)   Wt (!) 300 lb 3.2 oz (136.2 kg)   LMP 10/01/2022   BMI 43.07 kg/m  UPT is negative Skin warm and dry.Pelvic: external genitalia is normal in appearance no lesions, vagina: white discharge without odor,urethra has no lesions or masses noted, cervix:smooth, uterus: normal size, shape and contour, non tender, no masses felt, adnexa: no masses or tenderness noted. Bladder is non tender and no masses felt. CV swab obtained.  Upstream - 10/23/22 1125       Pregnancy Intention Screening   Does the patient want to become pregnant in the next year? No    Does the patient's partner want to become pregnant in the next year? No    Would the patient like to discuss contraceptive options today? Yes      Contraception Wrap Up   Current Method Abstinence    Reason for No Current Contraceptive Method at Intake ([facility] Only) Abstinence    End Method Abstinence   will get IUD   Contraception Counseling Provided  Yes            Examination chaperoned by Malachy Mood LPN     Assessment:     1. Pregnancy examination or test, negative result - POCT urine pregnancy  2. General counseling and advice on contraceptive management Discussed options and she wants mirena IUD has had in the past   3. Screen for STD (sexually transmitted disease) CV swab sent for GC/CHL,trich,BV and yeast Check HIV and RPR - HIV Antibody (routine testing w rflx) - RPR - Cervicovaginal ancillary only( )     Plan:    No sex  Return 5/30 for mirena IUD insertion with me

## 2022-10-24 ENCOUNTER — Other Ambulatory Visit: Payer: Self-pay | Admitting: Adult Health

## 2022-10-24 ENCOUNTER — Telehealth: Payer: Self-pay | Admitting: *Deleted

## 2022-10-24 LAB — CERVICOVAGINAL ANCILLARY ONLY
Bacterial Vaginitis (gardnerella): POSITIVE — AB
Candida Glabrata: NEGATIVE
Candida Vaginitis: NEGATIVE
Chlamydia: NEGATIVE
Comment: NEGATIVE
Comment: NEGATIVE
Comment: NEGATIVE
Comment: NEGATIVE
Comment: NEGATIVE
Comment: NORMAL
Neisseria Gonorrhea: NEGATIVE
Trichomonas: NEGATIVE

## 2022-10-24 LAB — RPR: RPR Ser Ql: NONREACTIVE

## 2022-10-24 LAB — HIV ANTIBODY (ROUTINE TESTING W REFLEX): HIV Screen 4th Generation wRfx: NONREACTIVE

## 2022-10-24 MED ORDER — METRONIDAZOLE 500 MG PO TABS: 500.0000 mg | ORAL_TABLET | Freq: Two times a day (BID) | ORAL | 0 refills | Status: DC

## 2022-10-24 MED ORDER — FLUCONAZOLE 150 MG PO TABS: ORAL_TABLET | ORAL | 1 refills | Status: DC

## 2022-10-24 NOTE — Progress Notes (Signed)
+  BV on vaginal swab, will rx flagyl, no sex or alcohol while taking  

## 2022-10-24 NOTE — Telephone Encounter (Signed)
Will rx diflucan  

## 2022-10-24 NOTE — Telephone Encounter (Signed)
Pt requesting Diflucan to be sent in since she taking antibiotics for BV.  Advised to check back with pharmacy later today.

## 2022-10-24 NOTE — Addendum Note (Signed)
Addended by: Cyril Mourning A on: 10/24/2022 04:48 PM   Modules accepted: Orders

## 2022-10-31 ENCOUNTER — Ambulatory Visit: Payer: BC Managed Care – PPO | Admitting: Adult Health

## 2022-11-28 ENCOUNTER — Ambulatory Visit: Payer: BC Managed Care – PPO | Admitting: Adult Health

## 2022-12-23 ENCOUNTER — Other Ambulatory Visit: Payer: Self-pay | Admitting: Adult Health

## 2022-12-23 MED ORDER — METRONIDAZOLE 500 MG PO TABS: 500.0000 mg | ORAL_TABLET | Freq: Two times a day (BID) | ORAL | 0 refills | Status: DC

## 2022-12-23 NOTE — Telephone Encounter (Signed)
Refill flagyl  

## 2023-03-03 ENCOUNTER — Other Ambulatory Visit: Payer: BC Managed Care – PPO

## 2023-03-03 DIAGNOSIS — N390 Urinary tract infection, site not specified: Secondary | ICD-10-CM | POA: Diagnosis not present

## 2023-03-31 ENCOUNTER — Ambulatory Visit: Payer: BC Managed Care – PPO | Admitting: Adult Health

## 2023-03-31 ENCOUNTER — Encounter: Payer: Self-pay | Admitting: Adult Health

## 2023-03-31 ENCOUNTER — Other Ambulatory Visit (HOSPITAL_COMMUNITY)
Admission: RE | Admit: 2023-03-31 | Discharge: 2023-03-31 | Disposition: A | Discharge status: Home / Self Care | Payer: BC Managed Care – PPO | Source: Ambulatory Visit | Attending: Adult Health | Admitting: Adult Health

## 2023-03-31 VITALS — BP 126/82 | HR 87 | Ht 70.0 in | Wt 302.4 lb

## 2023-03-31 DIAGNOSIS — Z113 Encounter for screening for infections with a predominantly sexual mode of transmission: Secondary | ICD-10-CM

## 2023-03-31 DIAGNOSIS — Z3009 Encounter for other general counseling and advice on contraception: Secondary | ICD-10-CM | POA: Diagnosis not present

## 2023-03-31 DIAGNOSIS — Z124 Encounter for screening for malignant neoplasm of cervix: Secondary | ICD-10-CM | POA: Diagnosis not present

## 2023-03-31 DIAGNOSIS — E0500 Thyrotoxicosis with diffuse goiter without thyrotoxic crisis or storm: Secondary | ICD-10-CM | POA: Diagnosis not present

## 2023-03-31 DIAGNOSIS — Z3202 Encounter for pregnancy test, result negative: Secondary | ICD-10-CM | POA: Diagnosis not present

## 2023-03-31 DIAGNOSIS — Z7689 Persons encountering health services in other specified circumstances: Secondary | ICD-10-CM

## 2023-03-31 DIAGNOSIS — Z8679 Personal history of other diseases of the circulatory system: Secondary | ICD-10-CM | POA: Diagnosis not present

## 2023-03-31 DIAGNOSIS — Z8742 Personal history of other diseases of the female genital tract: Secondary | ICD-10-CM | POA: Insufficient documentation

## 2023-03-31 DIAGNOSIS — F17200 Nicotine dependence, unspecified, uncomplicated: Secondary | ICD-10-CM | POA: Diagnosis not present

## 2023-03-31 DIAGNOSIS — Z6841 Body Mass Index (BMI) 40.0 and over, adult: Secondary | ICD-10-CM | POA: Diagnosis not present

## 2023-03-31 LAB — POCT URINE PREGNANCY: Preg Test, Ur: NEGATIVE

## 2023-03-31 NOTE — Progress Notes (Signed)
  Subjective:     Patient ID: Pamela [name], female   DOB: 03-24-1986, 37 y.o.   MRN: [id number]  HPI Pamela [name] is a 37 year old black female,single, [id number] in to discuss birth control and weight and needs pap.  She has had Graves Disease and CHF in the past and has not followed with cardiologist since last year and has never seen endocrinology and does not have a PCP. Last pap was LSIL, +HPV 02/18/22, had colpo 03/05/22 CIN 1   Review of Systems Can't lose weight Periods good Has not had sex lately, but gets frequent BV Reviewed past medical,surgical, social and family history. Reviewed medications and allergies.     Objective:   Physical Exam BP 126/82 (BP Location: Left Arm, Patient Position: Sitting, Cuff Size: Large)   Pulse 87   Ht 5\' 10"  (1.778 m)   Wt (!) 302 lb 6.4 oz (137.2 kg)   LMP 03/21/2023   BMI 43.39 kg/m     UPT is negative. Skin warm and dry.  Lungs: clear to ausculation bilaterally. Cardiovascular: regular rate and rhythm.  Pelvic: external genitalia is normal in appearance no lesions, vagina: pink, urethra has no lesions or masses noted, cervix:smooth, pap with HR HPV genotyping performed, CV swab obtained, uterus: normal size, shape and contour, non tender, no masses felt, adnexa: no masses or tenderness noted. Bladder is non tender and no masses felt.   Fall risk is low  Upstream - 03/31/23 1426       Pregnancy Intention Screening   Does the patient want to become pregnant in the next year? No    Does the patient's partner want to become pregnant in the next year? No    Would the patient like to discuss contraceptive options today? Yes      Contraception Wrap Up   Current Method Abstinence    End Method Female Condom    Contraception Counseling Provided Yes            Examination chaperoned by [place] Scearce NP student   Assessment:     1. Pregnancy examination or test, negative result - POCT urine pregnancy  2. Routine Papanicolaou smear Pap sent -  Cytology - PAP( [facility])  3. History of abnormal cervical Pap smear Pap sent  4. History of CHF (congestive heart failure) Refer back to Dr Servando Salina She is taking Lasix  - Ambulatory referral to Cardiology  5. Graves disease Was diagnosed last year is not on meds or had follow up labs, will refer to Dr Fransico Him  - Ambulatory referral to Endocrinology  6. Body mass index 40.0-44.9, adult (HCC) Talk to Dr Fransico Him about weight   7. Screen for STD (sexually transmitted disease) CV swab sent for GC/CHL.trich, BV and yeast Will check HI,RPR and Hepatitis C  - HIV Antibody (routine testing w rflx) - RPR - Hepatitis C antibody - Cervicovaginal ancillary only( [place])  8. Encounter to establish care Needs PCP will refer to Gilmore Laroche FNP - Ambulatory Referral to Primary Care  9. General counseling and advice on contraceptive management With her smoking and age, would only consider progestin only birth control, she wants IUD but not today, BR in from [employer] and she is planning on sex today, will use a condom   10. Smoker Try to stop smoking      Plan:     Call when period starts for IUD insertion

## 2023-04-01 LAB — HEPATITIS C ANTIBODY: Hep C Virus Ab: NONREACTIVE

## 2023-04-01 LAB — HIV ANTIBODY (ROUTINE TESTING W REFLEX): HIV Screen 4th Generation wRfx: NONREACTIVE

## 2023-04-01 LAB — RPR: RPR Ser Ql: NONREACTIVE

## 2023-04-02 LAB — CERVICOVAGINAL ANCILLARY ONLY
Bacterial Vaginitis (gardnerella): POSITIVE — AB
Candida Glabrata: NEGATIVE
Candida Vaginitis: NEGATIVE
Chlamydia: NEGATIVE
Comment: NEGATIVE
Comment: NEGATIVE
Comment: NEGATIVE
Comment: NEGATIVE
Comment: NEGATIVE
Comment: NORMAL
Neisseria Gonorrhea: NEGATIVE
Trichomonas: NEGATIVE

## 2023-04-02 NOTE — Patient Instructions (Incomplete)
Hyperthyroidism Hyperthyroidism refers to a thyroid gland that is too active or overactive. The thyroid gland is a small gland located in the lower front part of the neck, just in front of the windpipe (trachea). This gland makes hormones that: Help control how the body uses food for energy (metabolism). Help the heart and brain work well. Keep your bones strong. When the thyroid is overactive, it produces too much of a hormone called thyroxine. What are the causes? This condition may be caused by: Graves' disease. This is a disorder in which the body's disease-fighting system (immune system) attacks the thyroid gland. This is the most common cause. Inflammation of the thyroid gland. A tumor in the thyroid gland. Use of certain medicines, including: Prescription thyroid hormone replacement. Herbal supplements that mimic thyroid hormones. Amiodarone therapy. Solid or fluid-filled lumps within your thyroid gland (thyroid nodules). Taking in a large amount of iodine from foods or medicines. What increases the risk? You are more likely to develop this condition if: You are female. You have a family history of thyroid conditions. You smoke tobacco. You use a medicine called lithium. You take medicines that affect the immune system (immunosuppressants). What are the signs or symptoms? Symptoms of this condition include: Nervousness. Inability to tolerate heat. Diarrhea. Rapid heart rate. Shaky hands. Restlessness. Sleep problems. Other symptoms may include: Heart skipping beats or making extra beats. Unexplained weight loss. Change in the texture of hair or skin. Loss of menstruation. Fatigue. Enlarged thyroid gland or a lump in the thyroid (nodule). You may also have symptoms of Graves' disease, which may include: Protruding eyes. Dry eyes. Red or swollen eyes. Problems with vision. How is this diagnosed? This condition may be diagnosed based on: Your symptoms and medical  history. A physical exam. Blood tests. Thyroid ultrasound. This test involves using sound waves to produce images of the thyroid gland. A thyroid scan. A radioactive substance is injected into a vein, and images show how much iodine is present in the thyroid. Radioactive iodine uptake test (RAIU). A small amount of radioactive iodine is given by mouth to see how much iodine the thyroid absorbs after a certain amount of time. How is this treated? Treatment depends on the cause and severity of the condition. Treatment may include: Medicines to reduce the amount of thyroid hormone your body makes. Medicines to help manage your symptoms. Radioactive iodine treatment (radioiodine therapy). This involves swallowing a small dose of radioactive iodine, in capsule or liquid form, to kill thyroid cells. Surgery to remove part or all of your thyroid gland. You may need to take thyroid hormone replacement medicine for the rest of your life after thyroid surgery. Follow these instructions at home:  Take over-the-counter and prescription medicines only as told by your health care provider. Do not use any products that contain nicotine or tobacco. These products include cigarettes, chewing tobacco, and vaping devices, such as e-cigarettes. If you need help quitting, ask your health care provider. Follow any instructions from your health care provider about diet. You may be instructed to limit foods that contain iodine. Keep all follow-up visits. You will need to have blood tests regularly so that your health care provider can monitor your condition. Where to find more information National Institute of Diabetes and Digestive and Kidney Diseases: niddk.nih.gov Contact a health care provider if: Your symptoms do not get better with treatment. You have a fever. You have abdominal pain. You feel dizzy. You are taking thyroid hormone replacement medicine and: You have   symptoms of depression. You feel like you  are tired all the time. You gain weight. Get help right away if: You have sudden, unexplained confusion or other mental changes. You have chest pain. You have fast or irregular heartbeats (palpitations). You have difficulty breathing. These symptoms may be an emergency. Get help right away. Call 911. Do not wait to see if the symptoms will go away. Do not drive yourself to the hospital. Summary The thyroid gland is a small gland located in the lower front part of the neck, just in front of the windpipe. Hyperthyroidism is when the thyroid gland is too active and produces too much of a hormone called thyroxine. The most common cause is Graves' disease, a disorder in which your immune system attacks the thyroid gland. Hyperthyroidism can cause various symptoms, such as unexplained weight loss, nervousness, inability to tolerate heat, or changes in your heartbeat. Treatment may include medicine to reduce the amount of thyroid hormone your body makes, radioiodine therapy, surgery, or medicines to manage symptoms. This information is not intended to replace advice given to you by your health care provider. Make sure you discuss any questions you have with your health care provider. Document Revised: 07/13/2021 Document Reviewed: 07/13/2021 Elsevier Patient Education  2024 Elsevier Inc.  

## 2023-04-03 ENCOUNTER — Encounter: Payer: Self-pay | Admitting: Nurse Practitioner

## 2023-04-03 ENCOUNTER — Other Ambulatory Visit: Payer: Self-pay | Admitting: Adult Health

## 2023-04-03 ENCOUNTER — Ambulatory Visit: Payer: BC Managed Care – PPO | Admitting: Nurse Practitioner

## 2023-04-03 VITALS — BP 121/77 | HR 76 | Ht 70.0 in | Wt 303.6 lb

## 2023-04-03 DIAGNOSIS — E0590 Thyrotoxicosis, unspecified without thyrotoxic crisis or storm: Secondary | ICD-10-CM

## 2023-04-03 DIAGNOSIS — Z8349 Family history of other endocrine, nutritional and metabolic diseases: Secondary | ICD-10-CM | POA: Diagnosis not present

## 2023-04-03 LAB — CYTOLOGY - PAP
Comment: NEGATIVE
Diagnosis: NEGATIVE
Diagnosis: REACTIVE
High risk HPV: NEGATIVE

## 2023-04-03 MED ORDER — METRONIDAZOLE 500 MG PO TABS
500.0000 mg | ORAL_TABLET | Freq: Two times a day (BID) | ORAL | 0 refills | Status: DC
Start: 2023-04-03 — End: 2023-05-15

## 2023-04-03 MED ORDER — FLUCONAZOLE 150 MG PO TABS: ORAL_TABLET | ORAL | 1 refills | Status: DC

## 2023-04-03 MED ORDER — PROPRANOLOL HCL 20 MG PO TABS: 20.0000 mg | ORAL_TABLET | Freq: Two times a day (BID) | ORAL | 1 refills | Status: DC

## 2023-04-03 NOTE — Progress Notes (Addendum)
04/03/2023     Endocrinology Consult Note    Subjective:    Patient ID: Pamela [name], female    DOB: [birth year], PCP Pcp, No.   Past Medical History:  Diagnosis Date   Abnormal antenatal AFP screen    [place] and [place] was low risk.   Anxiety    Congestive heart disease (HCC)    Congestive heart failure (CHF) (HCC)    Genital herpes    Gonorrhea    Graves disease    History of abnormal Pap smear    History of postpartum depression, currently pregnant    HSV-2 infection complicating pregnancy    Will suppress at 34 weeks. Don't discuss in front of FOB.   Mental disorder    hx pp depression now anxiety   Papanicolaou smear of cervix with positive high risk human papilloma virus (HPV) test 02/26/2021   02/26/21 repeat pap in 1 year per [facility], 5 year risk for CIN3+ is 4.8%   Pregnant    Tobacco abuse 01/10/2012    Past Surgical History:  Procedure Laterality Date   CESAREAN SECTION  03/04/2011   Procedure: CESAREAN SECTION;  Surgeon: Lesly Dukes, MD;  Location: [facility];  Service: Gynecology;  Laterality: N/A;  primary of baby  boy at 64   CESAREAN SECTION N/A 10/27/2012   Procedure: CESAREAN SECTION;  Surgeon: Allie Bossier, MD;  Location: [facility];  Service: Obstetrics;  Laterality: N/A;   CHOLECYSTECTOMY  01/10/2012   Procedure: LAPAROSCOPIC CHOLECYSTECTOMY;  Surgeon: Dalia Heading, MD;  Location: [facility];  Service: General;  Laterality: N/A;  attempted Intraopertive cholangiogram   TOE SURGERY Left    02/2021   TONSILLECTOMY      Social History   Socioeconomic History   Marital status: Single    Spouse name: Not on file   Number of children: 2   Years of education: Not on file   Highest education level: Not on file  Occupational History   Occupation: works at group home  Tobacco Use   Smoking status: Some Days    Current packs/day: 0.25    Average packs/day: 0.3 packs/day for 5.0 years (1.3 ttl pk-yrs)    Types: Cigarettes    Passive exposure: Past    Smokeless tobacco: Never   Tobacco comments:    Quit smoking x6 months ago  Vaping Use   Vaping status: Former  Substance and Sexual Activity   Alcohol use: Not Currently    Comment: occ   Drug use: Yes    Frequency: 5.0 times per week    Types: Marijuana    Comment: a little bit   Sexual activity: Not Currently    Birth control/protection: None, Abstinence  Other Topics Concern   Not on file  Social History Narrative   Not on file   Social Determinants of Health   Financial Resource Strain: Medium Risk (02/18/2022)   Overall Financial Resource Strain (CARDIA)    Difficulty of Paying Living Expenses: Somewhat hard  Food Insecurity: Food Insecurity Present (02/18/2022)   Hunger Vital Sign    Worried About Running Out of Food in the Last Year: Sometimes true    Ran Out of Food in the Last Year: Often true  Transportation Needs: No Transportation Needs (02/18/2022)   PRAPARE - Administrator, Civil Service (Medical): No    Lack of Transportation (Non-Medical): No  Physical Activity: Insufficiently Active (02/18/2022)   Exercise Vital Sign    Days  of Exercise per Week: 3 days    Minutes of Exercise per Session: 10 min  Stress: Stress Concern Present (02/18/2022)   [employer] of Occupational Health - Occupational Stress Questionnaire    Feeling of Stress : Rather much  Social Connections: Moderately Integrated (02/18/2022)   Social Connection and Isolation Panel [NHANES]    Frequency of Communication with Friends and Family: More than three times a week    Frequency of Social Gatherings with Friends and Family: More than three times a week    Attends Religious Services: More than 4 times per year    Active Member of [employer] or Organizations: Yes    Attends Banker Meetings: 1 to 4 times per year    Marital Status: Never married    Family History  Problem Relation Age of Onset   Nephrolithiasis Father    Graves' disease Father    Thyroid  disease Father    Parkinson's disease Father    Hypertension Other    Cancer Other        colon    Outpatient Encounter Medications as of 04/03/2023  Medication Sig   Aspirin-Acetaminophen (GOODYS BODY PAIN PO) Take 1 packet by mouth 2 (two) times daily as needed (headache/pain).   furosemide (LASIX) 40 MG tablet Take 1 tablet (40 mg total) by mouth 2 (two) times daily.   LYSINE PO Take 1 tablet by mouth daily.   Multiple Vitamin (MULTIVITAMIN) tablet Take 1 tablet by mouth daily.   Multiple Vitamins-Minerals (HAIR SKIN AND NAILS FORMULA) TABS Take 1 tablet by mouth daily.   OREGANO PO Take 1 capsule by mouth daily.   propranolol (INDERAL) 20 MG tablet Take 1 tablet (20 mg total) by mouth 2 (two) times daily.   valACYclovir (VALTREX) 1000 MG tablet Take 1 bid x 10 days prn outbreak   [DISCONTINUED] fluconazole (DIFLUCAN) 150 MG tablet Take 1 now and 1 in 3 days if needed   potassium chloride SA (KLOR-CON M) 20 MEQ tablet Take 1 tablet (20 mEq total) by mouth daily. (Patient not taking: Reported on 03/31/2023)   No facility-administered encounter medications on file as of 04/03/2023.    ALLERGIES: Allergies  Allergen Reactions   Ceftriaxone Hives, Shortness Of Breath and Itching    Rocephin; Penicillins OK   Lactose Intolerance (Gi) Other (See Comments)    GI upset   Latex Itching and Other (See Comments)    discoloring of skin   Trazodone And Nefazodone Other (See Comments)    Causes nightmares    VACCINATION STATUS: Immunization History  Administered Date(s) Administered   Influenza-Unspecified 05/18/2013   PNEUMOCOCCAL CONJUGATE-20 09/04/2021   Tdap 03/05/2011, 10/28/2012     HPI  Pamela [name] is 37 y.o. female who presents today with a medical history as above. 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  she is being seen in consultation for hyperthyroidism requested by Cyril Mourning, NP.  she has been dealing with symptoms of  insomnia, anxiety, irritability, dry irritated eyes, blurred vision, decreased appetite, inability to lose weight, dry itchy skin, tremors, shortness of breath, and sense of choking or clearing throat since first diagnosed in 2023.  These symptoms are progressively worsening and troubling to her.  her most recent thyroid labs revealed suppressed TSH of 0.01 and high FT4 of >5.5 and elevated FT3 of 17.5 on 09/02/21.  She has suppressed TSH dating back to 2014.  She is not currently on any  treatment for hyperthyroidism.  She notes she was on Propanolol for symptom management in the past but has not taken in quite some time.  She also notes she may have been on Methimazole at one point as well.  She does note she has been self-medicating her symptoms with marijuana use.    she does have family history of thyroid dysfunction in her father Luiz Blare Disease) and an aunt (Graves Disease), but denies family hx of thyroid cancer. she denies personal history of goiter. she is not on any anti-thyroid medications nor on any thyroid hormone supplements. Denies use of Biotin containing supplements.  she is willing to proceed with appropriate work up and therapy for thyrotoxicosis.  She has a history of CHF, depression, and anxiety.  Review of systems  Constitutional: + increasing body weight, current Body mass index is 43.56 kg/m., + fatigue, no subjective hyperthermia, no subjective hypothermia Eyes: + blurry vision, no xerophthalmia, + dry irritated eyes ENT: no sore throat, no nodules palpated in throat, + intermittent dysphagia/odynophagia with frequent throat clearing, no hoarseness Cardiovascular: no chest pain, + shortness of breath, + palpitations, no leg swelling (takes Lasix daily for CHF) Respiratory: no cough, + shortness of breath Gastrointestinal: no nausea/vomiting/diarrhea, + decreased appetite Musculoskeletal: no muscle/joint aches Skin: no rashes, no hyperemia, + dry itchy skin Neurological: +  tremors, no numbness, no tingling, no dizziness Psychiatric: no depression, + anxiety with panic attacks   Objective:    BP 121/77 (BP Location: Left Arm, Patient Position: Sitting, Cuff Size: Large)   Pulse 76   Ht 5\' 10"  (1.778 m)   Wt (!) 303 lb 9.6 oz (137.7 kg)   LMP 03/21/2023   BMI 43.56 kg/m   Wt Readings from Last 3 Encounters:  04/03/23 (!) 303 lb 9.6 oz (137.7 kg)  03/31/23 (!) 302 lb 6.4 oz (137.2 kg)  10/23/22 (!) 300 lb 3.2 oz (136.2 kg)     BP Readings from Last 3 Encounters:  04/03/23 121/77  03/31/23 126/82  10/23/22 (!) 140/80                        Physical Exam- Limited  Constitutional:  Body mass index is 43.56 kg/m. , not in acute distress, + anxious/tearful state of mind Eyes:  EOMI, no exophthalmos Neck: Supple Thyroid: + gross goiter Cardiovascular: RRR, no murmurs, rubs, or gallops Respiratory: Adequate breathing efforts, no crackles, rales, rhonchi, or wheezing Musculoskeletal: no gross deformities, strength intact in all four extremities, no gross restriction of joint movements Skin:  no rashes, no hyperemia Neurological: + mild tremor with outstretched hands, DTR hyperactive in BLE   CMP     Component Value Date/Time   NA 137 11/13/2021 1516   NA 137 09/20/2021 0949   K 3.7 11/13/2021 1516   CL 105 11/13/2021 1516   CO2 21 (L) 11/13/2021 1516   GLUCOSE 86 11/13/2021 1516   BUN 6 11/13/2021 1516   BUN 10 09/20/2021 0949   CREATININE <0.30 (L) 11/13/2021 1516   CREATININE 0.68 11/11/2012 1331   CALCIUM 10.1 11/13/2021 1516   PROT 6.8 09/20/2021 0949   ALBUMIN 4.0 09/20/2021 0949   AST 21 09/20/2021 0949   ALT 22 09/20/2021 0949   ALKPHOS 95 09/20/2021 0949   BILITOT 0.3 09/20/2021 0949   EGFR 128 09/20/2021 0949   GFRNONAA NOT CALCULATED 11/13/2021 1516     CBC    Component Value Date/Time   WBC 11.1 (H) 11/13/2021 1516  RBC 4.60 11/13/2021 1516   HGB 11.4 (L) 11/13/2021 1516   HGB 13.6 04/07/2019 1031   HCT 36.8  11/13/2021 1516   HCT 39.3 04/07/2019 1031   PLT 278 11/13/2021 1516   PLT 347 04/07/2019 1031   MCV 80.0 11/13/2021 1516   MCV 88 04/07/2019 1031   MCH 24.8 (L) 11/13/2021 1516   MCHC 31.0 11/13/2021 1516   RDW 15.3 11/13/2021 1516   RDW 12.5 04/07/2019 1031   LYMPHSABS 1.9 09/03/2021 0910   MONOABS 0.8 09/03/2021 0910   EOSABS 0.3 09/03/2021 0910   BASOSABS 0.0 09/03/2021 0910     Diabetic Labs (most recent): Lab Results  Component Value Date   HGBA1C 6.0 (H) 05/16/2021   HGBA1C 5.7 (H) 04/07/2019    Lipid Panel     Component Value Date/Time   CHOL 186 04/07/2019 1031   TRIG 70 04/07/2019 1031   HDL 49 04/07/2019 1031   CHOLHDL 3.8 04/07/2019 1031   LDLCALC 124 (H) 04/07/2019 1031   LABVLDL 13 04/07/2019 1031     Lab Results  Component Value Date   TSH 0.001 (L) 09/02/2021   TSH 0.261 (L) 11/11/2012   TSH 0.440 01/09/2012   FREET4 >5.50 (H) 09/02/2021   FREET4 1.16 01/09/2012    Thyroid US from 09/02/21 CLINICAL DATA:  Thyromegaly , low TSH for   EXAM: THYROID ULTRASOUND   TECHNIQUE: Ultrasound examination of the thyroid gland and adjacent soft tissues was performed.   COMPARISON:  CT 09/02/2021 and previous   FINDINGS: Parenchymal Echotexture: Markedly heterogenous, hyperemic   Isthmus: 0.8 cm thickness   Right lobe: 7.8 x 3.1 x 3.7 cm   Left lobe: 7.9 x 3.1 x 2.9 cm   _________________________________________________________   Estimated total number of nodules >/= 1 cm: 0   Number of spongiform nodules >/=  2 cm not described below (TR1): 0   Number of mixed cystic and solid nodules >/= 1.5 cm not described below (TR2): 0   _________________________________________________________   No discrete nodules are seen within the thyroid gland. No regional cervical adenopathy identified.   IMPRESSION: 1. Thyromegaly with heterogenous hyperemic parenchyma. 2. No nodule or other indication for biopsy or further imaging evaluation.   The  above is in keeping with the ACR TI-RADS recommendations - J Am Coll Radiol 2017;14:587-595.     Electronically Signed   By: Corlis Leak M.D.   On: 09/03/2021 07:33     Latest Reference Range & Units 01/09/12 19:15 11/11/12 13:31 09/02/21 13:45 09/02/21 23:57  TSH 0.350 - 4.500 uIU/mL 0.440 0.261 (L) 0.001 (L)   Triiodothyronine,Free,Serum 2.0 - 4.4 pg/mL    17.5 (H)  T4,Free(Direct) 0.61 - 1.12 ng/dL 9.52  >8.41 (H)   (L): Data is abnormally low (H): Data is abnormally high   Assessment & Plan:   1. Hyperthyroidism- suspect autoimmune related due to positive family history 2. Family history of Graves disease  she is being seen at a kind request of Cyril Mourning, NP  her history and most recent labs are reviewed, and she was examined clinically. Subjective and objective findings are consistent with thyrotoxicosis likely from primary hyperthyroidism. The potential risks of untreated thyrotoxicosis and the need for definitive therapy have been discussed in detail with her, and she agrees to proceed with diagnostic workup and treatment plan.   I will repeat full profile thyroid function tests today (including antibody testing to assess for autoimmune thyroid dysfunction) and confirmatory thyroid uptake and scan will be scheduled to  be done as soon as possible.  Will call her with results and plan moving forward.   Options of therapy are discussed with her.  We discussed the option of treating it with medications including methimazole or PTU which may have side effects including rash, transaminitis, and bone marrow suppression.  We also discussed the option of definitive therapy with RAI ablation of the thyroid. If she is found to have primary hyperthyroidism from Graves' disease , toxic multinodular goiter or toxic nodular goiter the preferred modality of treatment would be I-131 thyroid ablation. Surgery is another choice of treatment in some cases, in her case surgery may not be a good  fit for given her CHF history.  -Patient is made aware of the high likelihood of post ablative hypothyroidism with subsequent need for lifelong thyroid hormone replacement. she understands this outcome and she is willing to proceed.   She did express her desire not to have any treatment that would cause worsening weight gain.  We discussed that slowing the thyroid down may result in some weight gain but that it weight fluctuations should resolve once we get the thyroid to a normal range.   I will initiate a temporary prescription for Propranolol 20 mg po BID for symptomatic relief which may also help with CHF.  She notes she has been re-referred to cardiology for follow up with her CHF as she has since lost contact with them.  I encouraged her to follow through with seeing them so that we can work together to come up with best treatment plan to meet her needs moving forward.  I did give her the name of a Thyroid Eye Disease specialist, Dr. Steele Sizer with [facility].  I did encourage her to reach out given symptoms which suggests she has some degree of TED.  -Patient is advised to maintain close follow up with Pcp, No for primary care needs.   - Time spent with the patient: 60 minutes, of which >50% was spent in obtaining information about her symptoms, reviewing her previous labs, evaluations, and treatments, counseling her about her hyperthyroidism , and developing a plan to confirm the diagnosis and long term treatment as necessary. Please refer to "Patient Self Inventory" in the Media tab for reviewed elements of pertinent patient history.  Pamela [name] participated in the discussions, expressed understanding, and voiced agreement with the above plans.  All questions were answered to her satisfaction. she is encouraged to contact clinic should she have any questions or concerns prior to her return visit.   Follow up plan: Return will call with thyroid lab results and uptake and  scan.   Thank you for involving me in the care of this pleasant patient, and I will continue to update you with her progress.    Ronny Bacon, [facility] [address] [place], [place] [address] Phone: [phone] Fax: [phone]  04/03/2023, 2:29 PM

## 2023-04-08 ENCOUNTER — Ambulatory Visit (HOSPITAL_COMMUNITY)
Admission: RE | Admit: 2023-04-08 | Discharge: 2023-04-08 | Disposition: A | Discharge status: Home / Self Care | Payer: BC Managed Care – PPO | Source: Ambulatory Visit | Attending: Nurse Practitioner | Admitting: Nurse Practitioner

## 2023-04-08 DIAGNOSIS — Z8349 Family history of other endocrine, nutritional and metabolic diseases: Secondary | ICD-10-CM | POA: Diagnosis not present

## 2023-04-08 DIAGNOSIS — E0590 Thyrotoxicosis, unspecified without thyrotoxic crisis or storm: Secondary | ICD-10-CM | POA: Diagnosis not present

## 2023-04-08 MED ORDER — SODIUM IODIDE I-123 7.4 MBQ CAPS
500.0000 | ORAL_CAPSULE | Freq: Once | ORAL | Status: AC
  Administered 2023-04-08: 452 via ORAL

## 2023-04-09 ENCOUNTER — Ambulatory Visit (HOSPITAL_COMMUNITY)
Admission: RE | Admit: 2023-04-09 | Discharge: 2023-04-09 | Disposition: A | Discharge status: Home / Self Care | Payer: BC Managed Care – PPO | Source: Ambulatory Visit | Attending: Nurse Practitioner | Admitting: Nurse Practitioner

## 2023-04-09 ENCOUNTER — Encounter: Payer: Self-pay | Admitting: Nurse Practitioner

## 2023-04-09 DIAGNOSIS — R251 Tremor, unspecified: Secondary | ICD-10-CM | POA: Diagnosis not present

## 2023-04-09 DIAGNOSIS — E0590 Thyrotoxicosis, unspecified without thyrotoxic crisis or storm: Secondary | ICD-10-CM | POA: Diagnosis not present

## 2023-04-09 DIAGNOSIS — E049 Nontoxic goiter, unspecified: Secondary | ICD-10-CM | POA: Diagnosis not present

## 2023-04-09 LAB — THYROGLOBULIN ANTIBODY: Thyroglobulin Antibody: 2.1 [IU]/mL — ABNORMAL HIGH (ref 0.0–0.9)

## 2023-04-09 LAB — T3, FREE: T3, Free: 5.6 pg/mL — ABNORMAL HIGH (ref 2.0–4.4)

## 2023-04-09 LAB — T4, FREE: Free T4: 2.11 ng/dL — ABNORMAL HIGH (ref 0.82–1.77)

## 2023-04-09 LAB — TSH: TSH: <0.005 u[IU]/mL — ABNORMAL LOW (ref 0.450–4.500)

## 2023-04-09 LAB — THYROID PEROXIDASE ANTIBODY: Thyroperoxidase Ab SerPl-aCnc: 133 [IU]/mL — ABNORMAL HIGH (ref 0–34)

## 2023-04-09 NOTE — Progress Notes (Signed)
FYI: I sent mychart message to patient going over recent thyroid labs.

## 2023-04-10 ENCOUNTER — Telehealth: Payer: Self-pay | Admitting: *Deleted

## 2023-04-10 NOTE — Telephone Encounter (Signed)
Noted that Ronny Bacon, NP has sent the patient a message about her test results.

## 2023-04-11 ENCOUNTER — Ambulatory Visit: Payer: BC Managed Care – PPO | Admitting: Physician Assistant

## 2023-04-14 ENCOUNTER — Ambulatory Visit: Payer: BC Managed Care – PPO | Attending: Nurse Practitioner | Admitting: Nurse Practitioner

## 2023-04-14 NOTE — Progress Notes (Deleted)
Office Visit    Patient Name: Pamela [name] Date of Encounter: 04/14/2023  Primary Care Provider:  Pcp, No Primary Cardiologist:  Thomasene Ripple, DO  Chief Complaint    37 year old female with a history of chronic diastolic heart failure, hypertension, hypothyroidism/Graves' disease, mediastinal mass, obesity, anxiety, depression, and tobacco use who presents for follow-up related to heart failure.  Past Medical History    Past Medical History:  Diagnosis Date   Abnormal antenatal AFP screen    [place] and [place] was low risk.   Anxiety    Congestive heart disease (HCC)    Congestive heart failure (CHF) (HCC)    Genital herpes    Gonorrhea    Graves disease    History of abnormal Pap smear    History of postpartum depression, currently pregnant    HSV-2 infection complicating pregnancy    Will suppress at 34 weeks. Don't discuss in front of FOB.   Mental disorder    hx pp depression now anxiety   Papanicolaou smear of cervix with positive high risk human papilloma virus (HPV) test 02/26/2021   02/26/21 repeat pap in 1 year per [facility], 5 year risk for CIN3+ is 4.8%   Pregnant    Tobacco abuse 01/10/2012   Past Surgical History:  Procedure Laterality Date   CESAREAN SECTION  03/04/2011   Procedure: CESAREAN SECTION;  Surgeon: Lesly Dukes, MD;  Location: [facility];  Service: Gynecology;  Laterality: N/A;  primary of baby  boy at 72   CESAREAN SECTION N/A 10/27/2012   Procedure: CESAREAN SECTION;  Surgeon: Allie Bossier, MD;  Location: [facility];  Service: Obstetrics;  Laterality: N/A;   CHOLECYSTECTOMY  01/10/2012   Procedure: LAPAROSCOPIC CHOLECYSTECTOMY;  Surgeon: Dalia Heading, MD;  Location: [facility];  Service: General;  Laterality: N/A;  attempted Intraopertive cholangiogram   TOE SURGERY Left    02/2021   TONSILLECTOMY      Allergies  Allergies  Allergen Reactions   Ceftriaxone Hives, Shortness Of Breath and Itching    Rocephin; Penicillins OK   Lactose Intolerance  (Gi) Other (See Comments)    GI upset   Latex Itching and Other (See Comments)    discoloring of skin   Trazodone And Nefazodone Other (See Comments)    Causes nightmares     Labs/Other Studies Reviewed    The following studies were reviewed today:  Cardiac Studies & Procedures       ECHOCARDIOGRAM  ECHOCARDIOGRAM COMPLETE 09/03/2021  Narrative ECHOCARDIOGRAM REPORT    Patient Name:   Pamela [name] Date of Exam: 09/03/2021 Medical Rec #:  [id number]    Height:       70.0 in Accession #:    [id number]   Weight:       299.1 lb Date of Birth:  Nov 22, 1985   BSA:          2.477 m Patient Age:    35 years     BP:           144/76 mmHg Patient Gender: F            HR:           97 bpm. Exam Location:  Inpatient  Procedure: 2D Echo, 3D Echo, Color Doppler and Cardiac Doppler  Indications:    Elevated BNP  History:        Patient has no prior history of Echocardiogram examinations.  Sonographer:    Irving Burton Senior RDCS Referring Phys: [phone] [email or web address]  RATHORE  IMPRESSIONS   1. Left ventricular ejection fraction, by estimation, is 60 to 65%. The left ventricle has normal function. The left ventricle has no regional wall motion abnormalities. There is mild left ventricular hypertrophy. Left ventricular diastolic parameters were normal. 2. Right ventricular systolic function is normal. The right ventricular size is normal. 3. The mitral valve is normal in structure. Trivial mitral valve regurgitation. No evidence of mitral stenosis. 4. The aortic valve is tricuspid. Aortic valve regurgitation is not visualized. No aortic stenosis is present. 5. The inferior vena cava is normal in size with greater than 50% respiratory variability, suggesting right atrial pressure of 3 mmHg.  Comparison(s): No prior Echocardiogram.  FINDINGS Left Ventricle: Left ventricular ejection fraction, by estimation, is 60 to 65%. The left ventricle has normal function. The left ventricle has no regional  wall motion abnormalities. The left ventricular internal cavity size was normal in size. There is mild left ventricular hypertrophy. Left ventricular diastolic parameters were normal.  Right Ventricle: The right ventricular size is normal. Right ventricular systolic function is normal.  Left Atrium: Left atrial size was normal in size.  Right Atrium: Right atrial size was normal in size.  Pericardium: There is no evidence of pericardial effusion.  Mitral Valve: The mitral valve is normal in structure. Mild mitral annular calcification. Trivial mitral valve regurgitation. No evidence of mitral valve stenosis.  Tricuspid Valve: The tricuspid valve is normal in structure. Tricuspid valve regurgitation is trivial. No evidence of tricuspid stenosis.  Aortic Valve: The aortic valve is tricuspid. Aortic valve regurgitation is not visualized. No aortic stenosis is present.  Pulmonic Valve: The pulmonic valve was normal in structure. Pulmonic valve regurgitation is not visualized. No evidence of pulmonic stenosis.  Aorta: The aortic root is normal in size and structure.  Venous: The inferior vena cava is normal in size with greater than 50% respiratory variability, suggesting right atrial pressure of 3 mmHg.  IAS/Shunts: The interatrial septum was not well visualized.   LEFT VENTRICLE PLAX 2D LVIDd:         4.60 cm      Diastology LVIDs:         3.60 cm      LV e' medial:    8.70 cm/s LV PW:         1.10 cm      LV E/e' medial:  12.1 LV IVS:        1.20 cm      LV e' lateral:   9.25 cm/s LVOT diam:     1.90 cm      LV E/e' lateral: 11.4 LV SV:         61 LV SV Index:   24 LVOT Area:     2.84 cm  3D Volume EF: LV Volumes (MOD)            3D EF:        45 % LV vol d, MOD A2C: 115.0 ml LV EDV:       197 ml LV vol d, MOD A4C: 132.0 ml LV ESV:       109 ml LV vol s, MOD A2C: 56.7 ml  LV SV:        88 ml LV vol s, MOD A4C: 68.3 ml LV SV MOD A2C:     58.3 ml LV SV MOD A4C:     132.0  ml LV SV MOD BP:      61.9 ml  RIGHT VENTRICLE RV S  prime:     13.60 cm/s TAPSE (M-mode): 2.2 cm  LEFT ATRIUM             Index        RIGHT ATRIUM           Index LA diam:        4.00 cm 1.62 cm/m   RA Area:     20.20 cm LA Vol (A2C):   72.1 ml 29.11 ml/m  RA Volume:   61.10 ml  24.67 ml/m LA Vol (A4C):   50.7 ml 20.47 ml/m LA Biplane Vol: 61.6 ml 24.87 ml/m AORTIC VALVE LVOT Vmax:   127.00 cm/s LVOT Vmean:  77.300 cm/s LVOT VTI:    0.214 m  AORTA Ao Root diam: 2.80 cm Ao Asc diam:  2.90 cm  MITRAL VALVE MV Area (PHT): 3.27 cm     SHUNTS MV Decel Time: 232 msec     Systemic VTI:  0.21 m MV E velocity: 105.00 cm/s  Systemic Diam: 1.90 cm MV A velocity: 64.90 cm/s MV E/A ratio:  1.62  Olga Millers MD Electronically signed by Olga Millers MD Signature Date/Time: 09/03/2021/12:07:42 PM    Final            Recent Labs: 04/08/2023: TSH <0.005  Recent Lipid Panel    Component Value Date/Time   CHOL 186 04/07/2019 1031   TRIG 70 04/07/2019 1031   HDL 49 04/07/2019 1031   CHOLHDL 3.8 04/07/2019 1031   LDLCALC 124 (H) 04/07/2019 1031    History of Present Illness    37 year old female with the above past medical history including chronic diastolic heart failure, hypertension, hypothyroidism/Graves' disease, mediastinal mass, obesity, anxiety, depression, and tobacco use.  She was hospitalized in April 2023 in the setting of acute diastolic heart failure.  She was diuresed with IV Lasix.  Echocardiogram at the time showed EF 60 to 65%, normal LV function, no RWMA, mild LVH, normal RV, no significant valvular abnormalities.  She was discharged on oral Lasix, irbesartan, and SGLT2 inhibitor.  However, this was later discontinued setting of recurrent UTI/yeast infection.  She was noted to have anterior mediastinal mass along with hilar and mediastinal adenopathy.  She was evaluated by CT surgery recommended tumor markers, MRI.  It appears this was never completed.  She  was last seen in office on 09/20/2021 and was stable from a cardiac standpoint.  She did note significant anxiety related to her health.  She presents today for follow-up.  Since her last visit  Chronic diastolic heart failure: Hypertension: Hypothyroidism: Mediastinal mass/hilar adenopathy: Obesity: Anxiety/depression: Tobacco use: Disposition:  Home Medications    Current Outpatient Medications  Medication Sig Dispense Refill   Aspirin-Acetaminophen (GOODYS BODY PAIN PO) Take 1 packet by mouth 2 (two) times daily as needed (headache/pain).     fluconazole (DIFLUCAN) 150 MG tablet Take 1 now and 1 in 3 days if needed 2 tablet 1   furosemide (LASIX) 40 MG tablet Take 1 tablet (40 mg total) by mouth 2 (two) times daily. 180 tablet 3   LYSINE PO Take 1 tablet by mouth daily.     metroNIDAZOLE (FLAGYL) 500 MG tablet Take 1 tablet (500 mg total) by mouth 2 (two) times daily. 14 tablet 0   Multiple Vitamin (MULTIVITAMIN) tablet Take 1 tablet by mouth daily.     Multiple Vitamins-Minerals (HAIR SKIN AND NAILS FORMULA) TABS Take 1 tablet by mouth daily.     OREGANO PO Take 1 capsule by mouth daily.  potassium chloride SA (KLOR-CON M) 20 MEQ tablet Take 1 tablet (20 mEq total) by mouth daily. (Patient not taking: Reported on 03/31/2023) 90 tablet 3   propranolol (INDERAL) 20 MG tablet Take 1 tablet (20 mg total) by mouth 2 (two) times daily. 180 tablet 1   valACYclovir (VALTREX) 1000 MG tablet Take 1 bid x 10 days prn outbreak 20 tablet 3   No current facility-administered medications for this visit.     Review of Systems    ***.  All other systems reviewed and are otherwise negative except as noted above.    Physical Exam    VS:  LMP 03/26/2023 (Within Days)  , BMI There is no height or weight on file to calculate BMI.     GEN: Well nourished, well developed, in no acute distress. HEENT: normal. Neck: Supple, no JVD, carotid bruits, or masses. Cardiac: RRR, no murmurs, rubs, or  gallops. No clubbing, cyanosis, edema.  Radials/DP/PT 2+ and equal bilaterally.  Respiratory:  Respirations regular and unlabored, clear to auscultation bilaterally. GI: Soft, nontender, nondistended, BS + x 4. MS: no deformity or atrophy. Skin: warm and dry, no rash. Neuro:  Strength and sensation are intact. Psych: Normal affect.  Accessory Clinical Findings    ECG personally reviewed by me today -    - no acute changes.   Lab Results  Component Value Date   WBC 11.1 (H) 11/13/2021   HGB 11.4 (L) 11/13/2021   HCT 36.8 11/13/2021   MCV 80.0 11/13/2021   PLT 278 11/13/2021   Lab Results  Component Value Date   CREATININE <0.30 (L) 11/13/2021   BUN 6 11/13/2021   NA 137 11/13/2021   K 3.7 11/13/2021   CL 105 11/13/2021   CO2 21 (L) 11/13/2021   Lab Results  Component Value Date   ALT 22 09/20/2021   AST 21 09/20/2021   ALKPHOS 95 09/20/2021   BILITOT 0.3 09/20/2021   Lab Results  Component Value Date   CHOL 186 04/07/2019   HDL 49 04/07/2019   LDLCALC 124 (H) 04/07/2019   TRIG 70 04/07/2019   CHOLHDL 3.8 04/07/2019    Lab Results  Component Value Date   HGBA1C 6.0 (H) 05/16/2021    Assessment & Plan    1.  ***  No BP recorded.  {Refresh Note OR Click here to enter BP  :1}***   Joylene Grapes, NP 04/14/2023, 5:25 AM

## 2023-04-28 ENCOUNTER — Telehealth: Payer: Self-pay | Admitting: *Deleted

## 2023-04-28 ENCOUNTER — Encounter: Payer: Self-pay | Admitting: Nurse Practitioner

## 2023-04-28 NOTE — Telephone Encounter (Signed)
Noted, Pamela [name] has reached out to the patient through MyChart to the patient going over recent uptake and scan results.

## 2023-04-28 NOTE — Telephone Encounter (Signed)
-----   Message from Dani Gobble sent at 04/28/2023  9:11 AM EST ----- FYI: I sent mychart message going over recent uptake and scan results.

## 2023-04-28 NOTE — Progress Notes (Signed)
FYI: I sent mychart message going over recent uptake and scan results.

## 2023-05-15 ENCOUNTER — Ambulatory Visit: Payer: BC Managed Care – PPO | Admitting: Adult Health

## 2023-05-15 ENCOUNTER — Encounter: Payer: Self-pay | Admitting: Adult Health

## 2023-05-15 VITALS — BP 149/85 | HR 90 | Ht 70.0 in | Wt 300.4 lb

## 2023-05-15 DIAGNOSIS — Z3202 Encounter for pregnancy test, result negative: Secondary | ICD-10-CM | POA: Diagnosis not present

## 2023-05-15 DIAGNOSIS — Z30430 Encounter for insertion of intrauterine contraceptive device: Secondary | ICD-10-CM | POA: Diagnosis not present

## 2023-05-15 LAB — POCT URINE PREGNANCY: Preg Test, Ur: NEGATIVE

## 2023-05-15 MED ORDER — LEVONORGESTREL 20 MCG/DAY IU IUD
1.0000 | INTRAUTERINE_SYSTEM | Freq: Once | INTRAUTERINE | Status: AC
  Administered 2023-05-15: 1 via INTRAUTERINE

## 2023-05-15 NOTE — Addendum Note (Signed)
Addended by: Freddie Apley R on: 05/15/2023 10:57 AM   Modules accepted: Orders

## 2023-05-15 NOTE — Progress Notes (Signed)
Patient ID: Pamela [name], female   DOB: Jul 16, 1985, 37 y.o.   MRN: [id number]   IUD INSERTION Patient name: Pamela [name] MRN [id number]  Date of birth: 1985/11/26 Subjective Findings:   Pamela [name] is a 37 y.o. [phone] African American female being seen today for insertion of a Mirena IUD.  Patient's last menstrual period was 05/14/2023 (exact date). Last sexual intercourse was about 04/24/23 Last pap.      Component Value Date/Time   DIAGPAP  03/31/2023 1432    - Negative for Intraepithelial Lesions or Malignancy (NILM)   DIAGPAP - Benign reactive/reparative changes 03/31/2023 1432   DIAGPAP - Low grade squamous intraepithelial lesion (LSIL) (A) 02/18/2022 1145   HPVHIGH Negative 03/31/2023 1432   HPVHIGH Positive (A) 02/18/2022 1145   HPVHIGH Positive (A) 02/16/2021 1248   ADEQPAP  03/31/2023 1432    Satisfactory for evaluation; transformation zone component PRESENT.   ADEQPAP  02/18/2022 1145    Satisfactory for evaluation; transformation zone component PRESENT.   ADEQPAP  02/16/2021 1248    Satisfactory for evaluation; transformation zone component ABSENT.    The risks and benefits of the method and placement have been thouroughly reviewed with the patient and all questions were answered.  Specifically the patient is aware of failure rate of 06/998, expulsion of the IUD and of possible perforation.  The patient is aware of irregular bleeding due to the method and understands the incidence of irregular bleeding diminishes with time.  Signed copy of informed consent in chart.      02/18/2022   11:46 AM 02/16/2021   12:43 PM 04/05/2019    3:12 PM  Depression screen PHQ 2/9  Decreased Interest 1 1 0  Down, Depressed, Hopeless 1 1 0  PHQ - 2 Score 2 2 0  Altered sleeping 3 3   Tired, decreased energy 1 1   Change in appetite 1 2   Feeling bad or failure about yourself  0 1   Trouble concentrating 1 1   Moving slowly or fidgety/restless 0 1   Suicidal thoughts 0 0   PHQ-9 Score  8 11         02/18/2022   11:46 AM 02/16/2021   12:43 PM  GAD 7 : Generalized Anxiety Score  Nervous, Anxious, on Edge 2 1  Control/stop worrying 1 1  Worry too much - different things 1 1  Trouble relaxing 1 1  Restless 0 1  Easily annoyed or irritable 2 1  Afraid - awful might happen 3 0  Total GAD 7 Score 10 6    Upstream - 05/15/23 1308       Pregnancy Intention Screening   Does the patient want to become pregnant in the next year? No    Does the patient's partner want to become pregnant in the next year? No    Would the patient like to discuss contraceptive options today? Yes      Contraception Wrap Up   Current Method Abstinence    End Method IUD or IUS    Contraception Counseling Provided Yes             Fall risk is moderate  Pertinent History Reviewed:   Reviewed past medical,surgical, social, obstetrical and family history.  Reviewed problem list, medications and allergies. Objective Findings & Procedure:   Vitals:   05/15/23 0847 05/15/23 0859  BP: (!) 147/91 (!) 149/85  Pulse: 100 90  Weight: (!) 300 lb 6.4 oz (136.3 kg)  Height: 5\' 10"  (1.778 m)   Body mass index is 43.1 kg/m.  Results for orders placed or performed in visit on 05/15/23 (from the past 24 hours)  POCT urine pregnancy   Collection Time: 05/15/23  9:03 AM  Result Value Ref Range   Preg Test, Ur Negative Negative     Time out was performed.  A Graves speculum was placed in the vagina.  The cervix was visualized, prepped using Betadine, and os finder used. The uterus was found to be neutral and it sounded to 7.5 cm.  Mirena  IUD placed per manufacturer's recommendations. The strings were trimmed to approximately 3 cm. The patient tolerated the procedure well.   Informal transvaginal sonogram was performed and the proper placement of the IUD was verified by me and [facility].  Chaperone: Freddie Apley   Assessment & Plan:   1) Mirena IUD insertion The patient was given post  procedure instructions, including signs and symptoms of infection and to check for the strings after each menses or each month, and refraining from intercourse or anything in the vagina for 3 days. She was given a care card with date IUD placed, and date IUD to be removed. She is scheduled for a f/u appointment in 4 weeks.  Orders Placed This Encounter  Procedures   POCT urine pregnancy    Return in about 4 weeks (around 06/12/2023) for follow up with me for IUD check .  Cyril Mourning NP 05/15/2023 9:36 AM

## 2023-05-22 ENCOUNTER — Emergency Department (HOSPITAL_BASED_OUTPATIENT_CLINIC_OR_DEPARTMENT_OTHER): Payer: BC Managed Care – PPO | Admitting: Radiology

## 2023-05-22 ENCOUNTER — Emergency Department (HOSPITAL_BASED_OUTPATIENT_CLINIC_OR_DEPARTMENT_OTHER): Payer: BC Managed Care – PPO

## 2023-05-22 ENCOUNTER — Inpatient Hospital Stay (HOSPITAL_BASED_OUTPATIENT_CLINIC_OR_DEPARTMENT_OTHER)
Admission: EM | Admit: 2023-05-22 | Discharge: 2023-05-25 | DRG: 644 | Disposition: A | Discharge status: Home / Self Care | Payer: BC Managed Care – PPO | Attending: Internal Medicine | Admitting: Internal Medicine

## 2023-05-22 ENCOUNTER — Encounter (HOSPITAL_BASED_OUTPATIENT_CLINIC_OR_DEPARTMENT_OTHER): Payer: Self-pay

## 2023-05-22 ENCOUNTER — Other Ambulatory Visit: Payer: Self-pay

## 2023-05-22 DIAGNOSIS — Z91128 Patient's intentional underdosing of medication regimen for other reason: Secondary | ICD-10-CM | POA: Diagnosis not present

## 2023-05-22 DIAGNOSIS — M799 Soft tissue disorder, unspecified: Secondary | ICD-10-CM | POA: Diagnosis not present

## 2023-05-22 DIAGNOSIS — E66813 Obesity, class 3: Secondary | ICD-10-CM | POA: Diagnosis not present

## 2023-05-22 DIAGNOSIS — Z91040 Latex allergy status: Secondary | ICD-10-CM | POA: Diagnosis not present

## 2023-05-22 DIAGNOSIS — Z885 Allergy status to narcotic agent status: Secondary | ICD-10-CM

## 2023-05-22 DIAGNOSIS — R0600 Dyspnea, unspecified: Secondary | ICD-10-CM | POA: Diagnosis present

## 2023-05-22 DIAGNOSIS — I5032 Chronic diastolic (congestive) heart failure: Secondary | ICD-10-CM | POA: Diagnosis not present

## 2023-05-22 DIAGNOSIS — J9859 Other diseases of mediastinum, not elsewhere classified: Secondary | ICD-10-CM | POA: Diagnosis not present

## 2023-05-22 DIAGNOSIS — R222 Localized swelling, mass and lump, trunk: Secondary | ICD-10-CM | POA: Diagnosis not present

## 2023-05-22 DIAGNOSIS — Z820 Family history of epilepsy and other diseases of the nervous system: Secondary | ICD-10-CM

## 2023-05-22 DIAGNOSIS — E0500 Thyrotoxicosis with diffuse goiter without thyrotoxic crisis or storm: Secondary | ICD-10-CM | POA: Diagnosis not present

## 2023-05-22 DIAGNOSIS — I5033 Acute on chronic diastolic (congestive) heart failure: Principal | ICD-10-CM

## 2023-05-22 DIAGNOSIS — Z8249 Family history of ischemic heart disease and other diseases of the circulatory system: Secondary | ICD-10-CM

## 2023-05-22 DIAGNOSIS — R591 Generalized enlarged lymph nodes: Secondary | ICD-10-CM | POA: Diagnosis present

## 2023-05-22 DIAGNOSIS — Z881 Allergy status to other antibiotic agents status: Secondary | ICD-10-CM | POA: Diagnosis not present

## 2023-05-22 DIAGNOSIS — D649 Anemia, unspecified: Secondary | ICD-10-CM | POA: Diagnosis not present

## 2023-05-22 DIAGNOSIS — R590 Localized enlarged lymph nodes: Secondary | ICD-10-CM | POA: Diagnosis not present

## 2023-05-22 DIAGNOSIS — R000 Tachycardia, unspecified: Secondary | ICD-10-CM | POA: Diagnosis not present

## 2023-05-22 DIAGNOSIS — I5083 High output heart failure: Secondary | ICD-10-CM | POA: Diagnosis not present

## 2023-05-22 DIAGNOSIS — Z79899 Other long term (current) drug therapy: Secondary | ICD-10-CM | POA: Diagnosis not present

## 2023-05-22 DIAGNOSIS — E739 Lactose intolerance, unspecified: Secondary | ICD-10-CM | POA: Diagnosis not present

## 2023-05-22 DIAGNOSIS — Z87891 Personal history of nicotine dependence: Secondary | ICD-10-CM

## 2023-05-22 DIAGNOSIS — I110 Hypertensive heart disease with heart failure: Secondary | ICD-10-CM | POA: Diagnosis not present

## 2023-05-22 DIAGNOSIS — E320 Persistent hyperplasia of thymus: Secondary | ICD-10-CM | POA: Diagnosis present

## 2023-05-22 DIAGNOSIS — I5030 Unspecified diastolic (congestive) heart failure: Secondary | ICD-10-CM | POA: Diagnosis present

## 2023-05-22 DIAGNOSIS — Z91148 Patient's other noncompliance with medication regimen for other reason: Secondary | ICD-10-CM

## 2023-05-22 DIAGNOSIS — Z6841 Body Mass Index (BMI) 40.0 and over, adult: Secondary | ICD-10-CM

## 2023-05-22 DIAGNOSIS — I509 Heart failure, unspecified: Secondary | ICD-10-CM | POA: Diagnosis not present

## 2023-05-22 DIAGNOSIS — Z8349 Family history of other endocrine, nutritional and metabolic diseases: Secondary | ICD-10-CM | POA: Diagnosis not present

## 2023-05-22 DIAGNOSIS — M79601 Pain in right arm: Secondary | ICD-10-CM | POA: Diagnosis not present

## 2023-05-22 DIAGNOSIS — R079 Chest pain, unspecified: Secondary | ICD-10-CM | POA: Diagnosis not present

## 2023-05-22 LAB — BASIC METABOLIC PANEL
Anion gap: 8 (ref 5–15)
BUN: 10 mg/dL (ref 6–20)
CO2: 24 mmol/L (ref 22–32)
Calcium: 9 mg/dL (ref 8.9–10.3)
Chloride: 106 mmol/L (ref 98–111)
Creatinine, Ser: 0.31 mg/dL — ABNORMAL LOW (ref 0.44–1.00)
GFR, Estimated: >60 mL/min (ref >60)
Glucose, Bld: 110 mg/dL — ABNORMAL HIGH (ref 70–99)
Potassium: 3.6 mmol/L (ref 3.5–5.1)
Sodium: 138 mmol/L (ref 135–145)

## 2023-05-22 LAB — CBC
HCT: 36 % (ref 36.0–46.0)
Hemoglobin: 11.8 g/dL — ABNORMAL LOW (ref 12.0–15.0)
MCH: 28.3 pg (ref 26.0–34.0)
MCHC: 32.8 g/dL (ref 30.0–36.0)
MCV: 86.3 fL (ref 80.0–100.0)
Platelets: 300 10*3/uL (ref 150–400)
RBC: 4.17 MIL/uL (ref 3.87–5.11)
RDW: 11.9 % (ref 11.5–15.5)
WBC: 9.4 10*3/uL (ref 4.0–10.5)
nRBC: 0 % (ref 0.0–0.2)

## 2023-05-22 LAB — TROPONIN I (HIGH SENSITIVITY)
Troponin I (High Sensitivity): 8 ng/L (ref <18)
Troponin I (High Sensitivity): 7 ng/L (ref <18)

## 2023-05-22 LAB — PREGNANCY, URINE: Preg Test, Ur: NEGATIVE

## 2023-05-22 LAB — T4, FREE: Free T4: >5.5 ng/dL — ABNORMAL HIGH (ref 0.61–1.12)

## 2023-05-22 LAB — TSH: TSH: <0.01 u[IU]/mL — ABNORMAL LOW (ref 0.350–4.500)

## 2023-05-22 LAB — BRAIN NATRIURETIC PEPTIDE: B Natriuretic Peptide: 81.7 pg/mL (ref 0.0–100.0)

## 2023-05-22 MED ORDER — PROPRANOLOL HCL 1 MG/ML IV SOLN
2.0000 mg | Freq: Once | INTRAVENOUS | Status: AC
  Administered 2023-05-22: 2 mg via INTRAVENOUS
  Filled 2023-05-22: qty 2

## 2023-05-22 MED ORDER — FUROSEMIDE 10 MG/ML IJ SOLN
40.0000 mg | Freq: Once | INTRAMUSCULAR | Status: AC
  Administered 2023-05-22: 40 mg via INTRAVENOUS
  Filled 2023-05-22: qty 4

## 2023-05-22 MED ORDER — PROPRANOLOL HCL 20 MG PO TABS: 20.0000 mg | ORAL_TABLET | Freq: Once | ORAL | Status: DC

## 2023-05-22 MED ORDER — IOHEXOL 350 MG/ML SOLN
100.0000 mL | Freq: Once | INTRAVENOUS | Status: AC | PRN
  Administered 2023-05-22: 75 mL via INTRAVENOUS

## 2023-05-22 NOTE — ED Provider Notes (Signed)
[place] EMERGENCY DEPARTMENT AT [facility] Provider Note   CSN: [id number] Arrival date & time: 05/22/23  1449     History  Chief Complaint  Patient presents with   Chest Pain    Pamela [name] is a 37 y.o. female.  This is a 37 year old female presenting emergency department with chief complaint of chest tightness and shortness of breath.  Patient reports history of Graves' disease on propranolol and Lasix.  Endorses medication noncompliance.  Has had worsening dyspnea on exertion, lower extremity edema and orthopnea over the past week.  No fevers chills.  She is not taking any medications to suppress her thyroid.   Chest Pain      Home Medications Prior to Admission medications   Medication Sig Start Date End Date Taking? Authorizing Provider  Aspirin-Acetaminophen (GOODYS BODY PAIN PO) Take 1 packet by mouth 2 (two) times daily as needed (headache/pain).    [provider]  furosemide (LASIX) 40 MG tablet Take 1 tablet (40 mg total) by mouth 2 (two) times daily. 09/20/21   Tobb, Kardie, DO  LYSINE PO Take 1 tablet by mouth daily.    [provider]  Multiple Vitamin (MULTIVITAMIN) tablet Take 1 tablet by mouth daily.    [provider]  Multiple Vitamins-Minerals (HAIR SKIN AND NAILS FORMULA) TABS Take 1 tablet by mouth daily.    [provider]  OREGANO PO Take 1 capsule by mouth daily.    [provider]  potassium chloride SA (KLOR-CON M) 20 MEQ tablet Take 1 tablet (20 mEq total) by mouth daily. 09/24/21   Tobb, Kardie, DO  propranolol (INDERAL) 20 MG tablet Take 1 tablet (20 mg total) by mouth 2 (two) times daily. 04/03/23   Dani Gobble, NP  valACYclovir (VALTREX) 1000 MG tablet Take 1 bid x 10 days prn outbreak 04/02/22   Cyril Mourning A, NP      Allergies    Ceftriaxone, Lactose intolerance (gi), Latex, and Trazodone and nefazodone    Review of Systems   Review of Systems  Cardiovascular:  Positive  for chest pain.    Physical Exam Updated Vital Signs BP 110/85   Pulse (!) 101   Temp 98.9 F (37.2 C) (Oral)   Resp 20   Ht 5\' 10"  (1.778 m)   Wt (!) 136.3 kg   LMP 05/14/2023 (Exact Date)   SpO2 99%   BMI 43.12 kg/m  Physical Exam Vitals and nursing note reviewed.  Constitutional:      General: She is not in acute distress.    Appearance: She is obese. She is not toxic-appearing.  HENT:     Head: Normocephalic.  Cardiovascular:     Rate and Rhythm: Regular rhythm. Tachycardia present.     Pulses:          Radial pulses are 2+ on the right side and 2+ on the left side.     Heart sounds: Normal heart sounds.  Pulmonary:     Effort: Pulmonary effort is normal.     Breath sounds: Normal breath sounds.  Abdominal:     Palpations: Abdomen is soft.  Musculoskeletal:     Cervical back: Normal range of motion.     Right lower leg: No edema.     Left lower leg: No edema.  Skin:    General: Skin is warm and dry.     Capillary Refill: Capillary refill takes less than 2 seconds.  Neurological:     General: No focal  deficit present.     Mental Status: She is alert.  Psychiatric:        Mood and Affect: Mood is anxious.        Behavior: Behavior normal.     ED Results / Procedures / Treatments   Labs (all labs ordered are listed, but only abnormal results are displayed) Labs Reviewed  BASIC METABOLIC PANEL - Abnormal; Notable for the following components:      Result Value   Glucose, Bld 110 (*)    Creatinine, Ser 0.31 (*)    All other components within normal limits  CBC - Abnormal; Notable for the following components:   Hemoglobin 11.8 (*)    All other components within normal limits  TSH - Abnormal; Notable for the following components:   TSH <0.010 (*)    All other components within normal limits  PREGNANCY, URINE  BRAIN NATRIURETIC PEPTIDE  T4, FREE  TROPONIN I (HIGH SENSITIVITY)  TROPONIN I (HIGH SENSITIVITY)    EKG None  Radiology CT Angio Chest  PE W and/or Wo Contrast Result Date: 05/22/2023 CLINICAL DATA:  Chest pain and right arm pain. PE suspected. Shortness of breath. EXAM: CT ANGIOGRAPHY CHEST WITH CONTRAST TECHNIQUE: Multidetector CT imaging of the chest was performed using the standard protocol during bolus administration of intravenous contrast. Multiplanar CT image reconstructions and MIPs were obtained to evaluate the vascular anatomy. RADIATION DOSE REDUCTION: This exam was performed according to the departmental dose-optimization program which includes automated exposure control, adjustment of the mA and/or kV according to patient size and/or use of iterative reconstruction technique. CONTRAST:  75mL OMNIPAQUE IOHEXOL 350 MG/ML SOLN COMPARISON:  Same-day radiograph and CT chest 09/02/2021 FINDINGS: Cardiovascular: Negative for acute pulmonary embolism. Normal caliber thoracic aorta. No pericardial effusion. Mediastinum/Nodes: Trachea and esophagus are unremarkable. Decreased soft tissue density in the anterior superior mediastinum. Decreased mediastinal lymphadenopathy. For example right pretracheal node now measures 6 mm, previously 13 mm and 8 mm left hilar node previously measured 14 mm. Lungs/Pleura: No focal consolidation, pleural effusion, or pneumothorax. Upper Abdomen: No acute abnormality. Musculoskeletal: No acute fracture. Review of the MIP images confirms the above findings. IMPRESSION: 1. Negative for acute pulmonary embolism. No acute abnormality in the chest. 2. Decreased soft tissue density in the anterior superior mediastinum and decreased mediastinal lymphadenopathy compared with 09/02/2021. Electronically Signed   By: Minerva Fester M.D.   On: 05/22/2023 18:50   DG Chest 2 View Result Date: 05/22/2023 CLINICAL DATA:  Chest pain EXAM: CHEST - 2 VIEW COMPARISON:  11/13/2021 FINDINGS: The heart size and mediastinal contours are within normal limits. Both lungs are clear. The visualized skeletal structures are  unremarkable. IMPRESSION: No active cardiopulmonary disease. Electronically Signed   By: Minerva Fester M.D.   On: 05/22/2023 18:43    Procedures Procedures    Medications Ordered in ED Medications  furosemide (LASIX) injection 40 mg (40 mg Intravenous Given 05/22/23 1818)  iohexol (OMNIPAQUE) 350 MG/ML injection 100 mL (75 mLs Intravenous Contrast Given 05/22/23 1751)  propranolol (INDERAL) injection 2 mg (2 mg Intravenous Given 05/22/23 2132)    ED Course/ Medical Decision Making/ A&P Clinical Course as of 05/22/23 2342  Thu May 22, 2023  1728 Per chart review of admission in April 2023 diagnosed with :   Assessment and Plan: * Acute diastolic heart failure (HCC) Patient was admitted to the cardiac ward, she was placed on furosemide for diuresis, negative fluid balance was achieved, -7,599 ml,  with significant improvement in her symptoms.  Further work up with echocardiogram with EF 60 to 65%, with no wall motion abnormalities. RV with preserved systolic function, no significant valvular disease.    High output heart failure due to thyrotoxicosis.    Patient will continue labetalol and ARB, diuresis with furosemide. Plan to follow up as outpatient, possible transition to entresto.    Graves disease Thyroid ultrasound done with thyromegaly with heterogeneous hyperemic parenchyma, no nodule or other indication for biopsy or further imaging evaluation. -Free T3 level elevated at 17.5 -Thyrotropin receptor antibodies elevated at 19.40.   Continue with methimazole and b blocker. Plan to follow up as outpatient with Dr Sharl Ma from endocrinology.    Mediastinal mass Case discussed with endocrinology Dr. 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 -Patient seen  in consultation by hematology/oncology who recommended evaluation of CT abdomen and pelvis which was negative for any further adenopathy and recommended evaluation by CT surgery. -Patient seen in consultation by CT surgery, Dr. Cliffton Asters who recommended outpatient follow-up. -Supportive care. " [TY]  1856 CT Angio Chest PE W and/or Wo Contrast IMPRESSION: 1. Negative for acute pulmonary embolism. No acute abnormality in the chest. 2. Decreased soft tissue density in the anterior superior mediastinum and decreased mediastinal lymphadenopathy compared with 09/02/2021.   Electronically Signed   [TY]  [id number] Per nurse note: Pts sats 89 on room air while ambulating 50 feet, hr increased to 130 and labored breathing. [TY]  2103 Case discussed with hospitalist, Dr. Margo Aye who refused admission as she thinks patient's presentation may be secondary to untreated Graves' disease and we do not have endocrinology at [facility]. [TY]  2121 Spoke with [facility] transfer center; they are reaching out to the providers. [TY]  2158 Nurse Hildred Priest with [facility] team accepts patient, but there is a current 36 hr-48hr wait list.  [TY]  2234 Reached back out to Dr. Margo Aye, updated on patient's vitals after propranolol and acceptance to [facility].  Given patient's 36 to 48-hour wait that it is not ideal for her to wait in the emergency department.  Dr. Margo Aye reviewed chart and will admit patient to [facility] to manage prior to transfer to [facility]. [TY]    Clinical Course User Index [TY] Coral Spikes, DO                                 Medical Decision Making This is a 37 year old female presenting emergency department with chest pain and shortness of breath.  Comorbid medical conditions to include diastolic heart failure secondary to her Graves' disease as well as morbid obesity.  Patient is tachycardic here hypertensive on arrival.  Maintain ox saturation on room air.  Appears  to be normal sinus rhythm my independent interpretation of EKG and monitor.  Patient admits to medication noncompliance.  She did not take her propranolol today.  Workup largely reassuring.  Troponin negative.  BNP not elevated.  Chest x-ray without pulmonary edema or pneumonia.  Given her tachycardia and shortness of breath concern for possible PE.  CTA however negative for acute pathology.  Patient was given a home dose of propranolol with improvement of her heart rate.  Largely asymptomatic at rest, but attempted to ambulate patient and  she desaturated to 89% and her heart rate elevated to 130 bpm.  I suspect a CHF component.  Given Lasix here in the emergency department. Low suspicion for thyroid storm. Case discussed with admitting doctor as well as [facility].  See ED course.  Will admit to [facility] for patient's heart failure until bed becomes available for at [facility].   Amount and/or Complexity of Data Reviewed Labs: ordered. Radiology: ordered. Decision-making details documented in ED Course.  Risk Prescription drug management. Decision regarding hospitalization.          Final Clinical Impression(s) / ED Diagnoses Final diagnoses:  Acute on chronic diastolic congestive heart failure [facility])    Rx / DC Orders ED Discharge Orders     None         Coral Spikes, DO 05/22/23 2342

## 2023-05-22 NOTE — ED Notes (Signed)
Pts sats 89 on room air while ambulating 50 feet, hr increased to 130 and labored breathing.

## 2023-05-22 NOTE — ED Notes (Signed)
Transported to CT 

## 2023-05-22 NOTE — ED Notes (Signed)
-  [facility] for consult to transfer at 914pm.

## 2023-05-22 NOTE — ED Triage Notes (Signed)
Pt c/o CP, R arm pain, "extremely thirsty but I'm also sweating so I don't know what's going on." Associated SHOB. Compliant w home meds Pt tearful in triage

## 2023-05-23 DIAGNOSIS — R222 Localized swelling, mass and lump, trunk: Secondary | ICD-10-CM | POA: Diagnosis not present

## 2023-05-23 DIAGNOSIS — E0500 Thyrotoxicosis with diffuse goiter without thyrotoxic crisis or storm: Secondary | ICD-10-CM | POA: Diagnosis not present

## 2023-05-23 DIAGNOSIS — Z6841 Body Mass Index (BMI) 40.0 and over, adult: Secondary | ICD-10-CM

## 2023-05-23 DIAGNOSIS — I5083 High output heart failure: Secondary | ICD-10-CM | POA: Diagnosis not present

## 2023-05-23 DIAGNOSIS — D649 Anemia, unspecified: Secondary | ICD-10-CM | POA: Diagnosis not present

## 2023-05-23 DIAGNOSIS — Z8349 Family history of other endocrine, nutritional and metabolic diseases: Secondary | ICD-10-CM | POA: Diagnosis not present

## 2023-05-23 DIAGNOSIS — J9859 Other diseases of mediastinum, not elsewhere classified: Secondary | ICD-10-CM | POA: Diagnosis not present

## 2023-05-23 DIAGNOSIS — E739 Lactose intolerance, unspecified: Secondary | ICD-10-CM | POA: Diagnosis not present

## 2023-05-23 DIAGNOSIS — E320 Persistent hyperplasia of thymus: Secondary | ICD-10-CM | POA: Diagnosis not present

## 2023-05-23 DIAGNOSIS — Z91128 Patient's intentional underdosing of medication regimen for other reason: Secondary | ICD-10-CM | POA: Diagnosis not present

## 2023-05-23 DIAGNOSIS — E66813 Obesity, class 3: Secondary | ICD-10-CM | POA: Diagnosis not present

## 2023-05-23 DIAGNOSIS — Z885 Allergy status to narcotic agent status: Secondary | ICD-10-CM | POA: Diagnosis not present

## 2023-05-23 DIAGNOSIS — Z87891 Personal history of nicotine dependence: Secondary | ICD-10-CM | POA: Diagnosis not present

## 2023-05-23 DIAGNOSIS — R591 Generalized enlarged lymph nodes: Secondary | ICD-10-CM | POA: Diagnosis not present

## 2023-05-23 DIAGNOSIS — Z881 Allergy status to other antibiotic agents status: Secondary | ICD-10-CM | POA: Diagnosis not present

## 2023-05-23 DIAGNOSIS — R0600 Dyspnea, unspecified: Secondary | ICD-10-CM | POA: Diagnosis present

## 2023-05-23 DIAGNOSIS — Z79899 Other long term (current) drug therapy: Secondary | ICD-10-CM | POA: Diagnosis not present

## 2023-05-23 DIAGNOSIS — I5032 Chronic diastolic (congestive) heart failure: Secondary | ICD-10-CM

## 2023-05-23 DIAGNOSIS — Z8249 Family history of ischemic heart disease and other diseases of the circulatory system: Secondary | ICD-10-CM | POA: Diagnosis not present

## 2023-05-23 DIAGNOSIS — Z91148 Patient's other noncompliance with medication regimen for other reason: Secondary | ICD-10-CM | POA: Diagnosis not present

## 2023-05-23 DIAGNOSIS — Z820 Family history of epilepsy and other diseases of the nervous system: Secondary | ICD-10-CM | POA: Diagnosis not present

## 2023-05-23 DIAGNOSIS — Z91040 Latex allergy status: Secondary | ICD-10-CM | POA: Diagnosis not present

## 2023-05-23 DIAGNOSIS — R000 Tachycardia, unspecified: Secondary | ICD-10-CM | POA: Diagnosis not present

## 2023-05-23 LAB — TSH: TSH: <0.01 u[IU]/mL — ABNORMAL LOW (ref 0.350–4.500)

## 2023-05-23 MED ORDER — FUROSEMIDE 10 MG/ML IJ SOLN
40.0000 mg | Freq: Once | INTRAMUSCULAR | Status: AC
  Administered 2023-05-23: 40 mg via INTRAVENOUS
  Filled 2023-05-23: qty 4

## 2023-05-23 MED ORDER — FUROSEMIDE 10 MG/ML IJ SOLN
40.0000 mg | Freq: Two times a day (BID) | INTRAMUSCULAR | Status: DC
  Filled 2023-05-23: qty 4

## 2023-05-23 MED ORDER — SODIUM CHLORIDE 0.9 % IV SOLN: 250.0000 mL | INTRAVENOUS | Status: DC | PRN

## 2023-05-23 MED ORDER — PROPRANOLOL HCL 20 MG PO TABS: 20.0000 mg | ORAL_TABLET | Freq: Once | ORAL | Status: DC

## 2023-05-23 MED ORDER — FUROSEMIDE 40 MG PO TABS
40.0000 mg | ORAL_TABLET | Freq: Two times a day (BID) | ORAL | Status: DC
  Administered 2023-05-23 – 2023-05-25 (×4): 40 mg via ORAL
  Filled 2023-05-23 (×4): qty 1

## 2023-05-23 MED ORDER — SODIUM CHLORIDE 0.9% FLUSH
3.0000 mL | Freq: Two times a day (BID) | INTRAVENOUS | Status: DC
  Administered 2023-05-23 – 2023-05-24 (×2): 3 mL via INTRAVENOUS

## 2023-05-23 MED ORDER — ONDANSETRON HCL 4 MG/2ML IJ SOLN: 4.0000 mg | Freq: Four times a day (QID) | INTRAMUSCULAR | Status: DC | PRN

## 2023-05-23 MED ORDER — METHIMAZOLE 5 MG PO TABS
10.0000 mg | ORAL_TABLET | Freq: Two times a day (BID) | ORAL | Status: DC
  Administered 2023-05-23 – 2023-05-25 (×4): 10 mg via ORAL
  Filled 2023-05-23 (×4): qty 2

## 2023-05-23 MED ORDER — PROPRANOLOL HCL 10 MG PO TABS
20.0000 mg | ORAL_TABLET | Freq: Two times a day (BID) | ORAL | Status: DC
  Administered 2023-05-23 – 2023-05-25 (×4): 20 mg via ORAL
  Filled 2023-05-23 (×4): qty 2

## 2023-05-23 MED ORDER — PROPRANOLOL HCL 1 MG/ML IV SOLN
2.0000 mg | Freq: Once | INTRAVENOUS | Status: AC
  Administered 2023-05-23: 2 mg via INTRAVENOUS
  Filled 2023-05-23: qty 2

## 2023-05-23 MED ORDER — SODIUM CHLORIDE 0.9% FLUSH: 3.0000 mL | INTRAVENOUS | Status: DC | PRN

## 2023-05-23 MED ORDER — ACETAMINOPHEN 325 MG PO TABS: 650.0000 mg | ORAL_TABLET | ORAL | Status: DC | PRN

## 2023-05-23 MED ORDER — ENOXAPARIN SODIUM 40 MG/0.4ML IJ SOSY
40.0000 mg | PREFILLED_SYRINGE | INTRAMUSCULAR | Status: DC
  Administered 2023-05-24: 40 mg via SUBCUTANEOUS
  Filled 2023-05-23 (×2): qty 0.4

## 2023-05-23 NOTE — H&P (Signed)
History and Physical    Patient: Pamela [name] XNA:[id number] DOB: 1985-10-19 DOA: 05/22/2023 DOS: the patient was seen and examined on 05/23/2023 PCP: Pcp, No  Patient coming from: Transfer from [facility]  Chief Complaint:  Chief Complaint  Patient presents with   Chest Pain   HPI: Pamela [name] is a 37 y.o. female with medical history significant of failure with preserved EF and Graves' disease initially diagnosed back in 2023 previously on methimazole who presents with complaints of having severe palpitations, chest pain, and shortness of breath. These symptoms are particularly pronounced during physical activity, such as walking short distances. The patient also reports nausea, to the point of near vomiting, associated with these episodes.  She has been on propranolol for the Graves' disease, but had not been on methimazole in some time.  She reports that she had previously undergone radiation therapy for the thyroid condition, but it was unsuccessful and had been recommended surgery.  She is apprehensive to have any kind of surgical procedure.  Patient reports being followed by endocrinology in [place], but does not state why she is no longer on methimazole.  In addition to these symptoms, the patient has been experiencing leg swelling despite taking Lasix for this. They report that their weight has been fluctuating significantly over the past two months, with recent weight gain and mild swelling. The patient also mentions difficulty sleeping and a lack of appetite, for which he does admit to use cannabis.  In the emergency department patient was noted to be afebrile with pulse 93-1 08, respirations 11-24, blood pressures elevated up to 158/92, and O2 saturations on room air.  Labs noted hemoglobin 11.8, BUN 10, creatinine 0.31, BNP 81.7, and high-sensitivity troponins negative x 2.  Chest x-ray did not note any acute abnormality.  CT angiogram was negative for acute pulmonary embolism and  noted decreased soft tissue density in the anterior superior mediastinum and decreased mediastinal lymph adenopathy when compared to 09/02/2021.  Patient had been given propranolol 2 mg IV x 2 doses and furosemide 40 mg IV twice.  Attempts have been made to transfer to [facility] on where patient could be seen by endocrinology but no beds were available.    Review of Systems: As mentioned in the history of present illness. All other systems reviewed and are negative. Past Medical History:  Diagnosis Date   Abnormal antenatal AFP screen    [place] and [place] was low risk.   Anxiety    Congestive heart disease (HCC)    Congestive heart failure (CHF) (HCC)    Genital herpes    Gonorrhea    Graves disease    History of abnormal Pap smear    History of postpartum depression, currently pregnant    HSV-2 infection complicating pregnancy    Will suppress at 34 weeks. Don't discuss in front of FOB.   Mental disorder    hx pp depression now anxiety   Papanicolaou smear of cervix with positive high risk human papilloma virus (HPV) test 02/26/2021   02/26/21 repeat pap in 1 year per [facility], 5 year risk for CIN3+ is 4.8%   Pregnant    Tobacco abuse 01/10/2012   Past Surgical History:  Procedure Laterality Date   CESAREAN SECTION  03/04/2011   Procedure: CESAREAN SECTION;  Surgeon: Lesly Dukes, MD;  Location: [facility] ORS;  Service: Gynecology;  Laterality: N/A;  primary of baby  boy at 52   CESAREAN SECTION N/A 10/27/2012   Procedure: CESAREAN  SECTION;  Surgeon: Allie Bossier, MD;  Location: [facility] ORS;  Service: Obstetrics;  Laterality: N/A;   CHOLECYSTECTOMY  01/10/2012   Procedure: LAPAROSCOPIC CHOLECYSTECTOMY;  Surgeon: Dalia Heading, MD;  Location: [facility];  Service: General;  Laterality: N/A;  attempted Intraopertive cholangiogram   TOE SURGERY Left    02/2021   TONSILLECTOMY     Social History:  reports that she has quit smoking. Her smoking use included cigarettes. She has a 1.3  pack-year smoking history. She has been exposed to tobacco smoke. She has never used smokeless tobacco. She reports that she does not currently use alcohol. She reports current drug use. Frequency: 5.00 times per week. Drug: Marijuana.  Allergies  Allergen Reactions   Ceftriaxone Hives, Shortness Of Breath and Itching    Rocephin; Penicillins OK   Lactose Intolerance (Gi) Other (See Comments)    GI upset   Latex Itching and Other (See Comments)    discoloring of skin   Trazodone And Nefazodone Other (See Comments)    Causes nightmares    Family History  Problem Relation Age of Onset   Nephrolithiasis Father    Graves' disease Father    Thyroid disease Father    Parkinson's disease Father    Hypertension Other    Cancer Other        colon    Prior to Admission medications   Medication Sig Start Date End Date Taking? Authorizing Provider  Aspirin-Acetaminophen (GOODYS BODY PAIN PO) Take 1 packet by mouth 2 (two) times daily as needed (headache/pain).    [provider]  furosemide (LASIX) 40 MG tablet Take 1 tablet (40 mg total) by mouth 2 (two) times daily. 09/20/21   Tobb, Kardie, DO  LYSINE PO Take 1 tablet by mouth daily.    [provider]  Multiple Vitamin (MULTIVITAMIN) tablet Take 1 tablet by mouth daily.    [provider]  Multiple Vitamins-Minerals (HAIR SKIN AND NAILS FORMULA) TABS Take 1 tablet by mouth daily.    [provider]  OREGANO PO Take 1 capsule by mouth daily.    [provider]  potassium chloride SA (KLOR-CON M) 20 MEQ tablet Take 1 tablet (20 mEq total) by mouth daily. 09/24/21   Tobb, Kardie, DO  propranolol (INDERAL) 20 MG tablet Take 1 tablet (20 mg total) by mouth 2 (two) times daily. 04/03/23   Dani Gobble, NP  valACYclovir (VALTREX) 1000 MG tablet Take 1 bid x 10 days prn outbreak 04/02/22   Adline Potter, NP    Physical Exam: Vitals:   05/23/23 0653 05/23/23 0816 05/23/23 1019 05/23/23 1342   BP:   (!) 153/83 135/75  Pulse: 96  98   Resp: 20  14   Temp:  98.2 F (36.8 C) 97.8 F (36.6 C) 98.1 F (36.7 C)  TempSrc:  Oral Oral Oral  SpO2: 98%  100% 100%  Weight:      Height:       Exam  Constitutional: Young obese female who appears to be in no acute distress Eyes: PERRL, lids and conjunctivae normal ENMT: Mucous membranes are moist. Normal dentition.  Neck: Supple with enlarged thyroid gland Respiratory: clear to auscultation bilaterally, no wheezing, no crackles. Normal respiratory effort. No accessory muscle use.  Cardiovascular: Regular rate and rhythm, no murmurs / rubs / gallops.  Trace lower extremity edema.  Abdomen: no tenderness, no masses palpated.   Bowel sounds positive.  Musculoskeletal: no clubbing / cyanosis. No joint deformity upper  and lower extremities. Good ROM, no contractures. Normal muscle tone.  Skin: no rashes, lesions, ulcers. No induration Neurologic: CN 2-12 grossly intact.   Strength 5/5 in all 4.  Psychiatric: Normal judgment and insight. Alert and oriented x 3.  Anxious mood.   Data Reviewed:  Reviewed labs, imaging, and pertinent records as documented  Assessment and Plan:  Graves' disease Patient presents with complaints of palpitations, chest pain, hair thinning, and dyspnea on exertion.  Labs significant for TSH of less than 0.01 and free T4 greater than 5.5.  Patient had been on propranolol 20 mg twice daily.  Suspect symptoms secondary to patient's hyperthyroidism not being effectively managed. -Admit to a telemetry bed -Continue propranolol -Restart methimazole 10 mg twice daily has free T4 greater than 4 times upper limit of normal -Recommend patient follow-up in outpatient setting with endocrinology surgery for possible thyroidectomy  Heart failure with preserved ejection fraction Chronic.  Patient had reported lower extremity swelling and weight gain.  On physical exam currently she appears to be relatively euvolemic.  Labs  noted BNP 81.7 which appears improved from prior.  Last echocardiogram noted EF 60 to 65% with normal diastolic parameters on 09/03/2021.  Patient having given Lasix 40 mg IV x 2 doses.   -Strict I&Os and daily weights -Check  echocardiogram -Resume previous dose of furosemide 40 mg twice daily  Mediastinal mass CT angiogram of the chest from 12/19 noted decreased soft tissue density in the anterior superior mediastinum and decreased mediastinal lymph adenopathy when compared to 09/02/2021.  Followed up with CT surgery in outpatient setting 1 time, but appears to have been lost to follow-up thereafter.  Normocytic anemia Hemoglobin 11.8 g/dL which appears similar to priors. -Continue to monitor  Morbid obesity BMI 43.12 kg/m  DVT prophylaxis: Lovenox Advance Care Planning:   Code Status: Full Code   Consults: None  Family Communication: None requested when asked  Severity of Illness: The appropriate patient status for this patient is INPATIENT. Inpatient status is judged to be reasonable and necessary in order to provide the required intensity of service to ensure the patient's safety. The patient's presenting symptoms, physical exam findings, and initial radiographic and laboratory data in the context of their chronic comorbidities is felt to place them at high risk for further clinical deterioration. Furthermore, it is not anticipated that the patient will be medically stable for discharge from the hospital within 2 midnights of admission.   * I certify that at the point of admission it is my clinical judgment that the patient will require inpatient hospital care spanning beyond 2 midnights from the point of admission due to high intensity of service, high risk for further deterioration and high frequency of surveillance required.*  Author: Clydie Braun, MD 05/23/2023 1:48 PM  For on call review [email or web address].

## 2023-05-23 NOTE — ED Notes (Addendum)
Pt is refusing to keep cardiac monitor on while she is sleeping. Pt took cardiac monitor off at 0015 This RN educated patient on the safety of keeping her leads on.Continuous rounding completed to ensure patient safety. Pt is resting at this time with no complaints or concerns.

## 2023-05-23 NOTE — ED Notes (Signed)
Called [facility] for transport 12:05

## 2023-05-23 NOTE — ED Notes (Signed)
Called [facility] at [phone], Angelique confirmed that patient is still on wait list for a bed. Placement estimate not available. 09:43

## 2023-05-23 NOTE — ED Provider Notes (Signed)
Emergency Medicine Observation Re-evaluation Note  Pamela [name] is a 37 y.o. female, seen on rounds today.  Pt initially presented to the ED for complaints of Chest Pain Currently, the patient is awake and alert.  Pt came in last night with cp and sob.  She does have a hx of Grave's disease, but is not currently on anything for her thyroid.  She did desat down to 89% with ambulation yesterday.   She did feel much better after lasix and is no longer requiring oxygen.  She has urinated a lot, but nursing has not documented amounts.  Pt said her HR did go up when she went to the bathroom and she felt sob, but not as bad as yesterday.  She has not had any of her am meds.  Thyroid labs c/w hyperthyroidism.  She is on the waiting list for [facility] and for [facility].  [facility] hospitalists would prefer an endocrinology consult as she has untreated hyperthyroidism which we don't have as an inpatient at [facility].  However, no chance of bed at [facility] for several days.  So, she is waiting for a bed at [facility].    Physical Exam  BP (!) 153/83 (BP Location: Right Arm)   Pulse 98   Temp 97.8 F (36.6 C) (Oral)   Resp 14   Ht 5\' 10"  (1.778 m)   Wt (!) 136.3 kg   LMP 05/14/2023 (Exact Date)   SpO2 100%   BMI 43.12 kg/m  Physical Exam General: awake and alert Cardiac: tachy after ambulation Lungs: clear Psych: calm  ED Course / MDM  EKG:   I have reviewed the labs performed to date as well as medications administered while in observation.  Recent changes in the last 24 hours include diuresing and sx improving.  Tachy with ambulation, but she is supposed to be on propranolol bid and she has not had morning dose.  Morning meds ordered.  Plan  Current plan is for admission.    Jacalyn Lefevre, MD 05/23/23 [phone]

## 2023-05-23 NOTE — ED Notes (Addendum)
Notified [facility] [phone] that patient has a room at [facility] and will still be in the workqueue for placement at [facility] per Dr. Particia Nearing 12:20

## 2023-05-23 NOTE — Plan of Care (Signed)

## 2023-05-24 DIAGNOSIS — D649 Anemia, unspecified: Secondary | ICD-10-CM | POA: Diagnosis not present

## 2023-05-24 DIAGNOSIS — E0500 Thyrotoxicosis with diffuse goiter without thyrotoxic crisis or storm: Secondary | ICD-10-CM | POA: Diagnosis not present

## 2023-05-24 LAB — BASIC METABOLIC PANEL
Anion gap: 9 (ref 5–15)
BUN: 8 mg/dL (ref 6–20)
CO2: 24 mmol/L (ref 22–32)
Calcium: 9 mg/dL (ref 8.9–10.3)
Chloride: 105 mmol/L (ref 98–111)
Creatinine, Ser: 0.36 mg/dL — ABNORMAL LOW (ref 0.44–1.00)
GFR, Estimated: >60 mL/min (ref >60)
Glucose, Bld: 98 mg/dL (ref 70–99)
Potassium: 3.7 mmol/L (ref 3.5–5.1)
Sodium: 138 mmol/L (ref 135–145)

## 2023-05-24 LAB — RAPID URINE DRUG SCREEN, HOSP PERFORMED
Amphetamines: NOT DETECTED
Barbiturates: NOT DETECTED
Benzodiazepines: NOT DETECTED
Cocaine: NOT DETECTED
Opiates: NOT DETECTED
Tetrahydrocannabinol: POSITIVE — AB

## 2023-05-24 MED ORDER — POTASSIUM CHLORIDE CRYS ER 20 MEQ PO TBCR
40.0000 meq | EXTENDED_RELEASE_TABLET | Freq: Once | ORAL | Status: AC
  Administered 2023-05-24: 40 meq via ORAL
  Filled 2023-05-24: qty 2

## 2023-05-24 NOTE — Plan of Care (Signed)
Pt stable on RA. A&Ox4. Voiding.    Problem: Education: Goal: Knowledge of General Education information will improve Description: Including pain rating scale, medication(s)/side effects and non-pharmacologic comfort measures Outcome: Progressing   Problem: Health Behavior/Discharge Planning: Goal: Ability to manage health-related needs will improve Outcome: Progressing   Problem: Clinical Measurements: Goal: Ability to maintain clinical measurements within normal limits will improve Outcome: Progressing Goal: Will remain free from infection Outcome: Progressing Goal: Diagnostic test results will improve Outcome: Progressing Goal: Respiratory complications will improve Outcome: Progressing Goal: Cardiovascular complication will be avoided Outcome: Progressing   Problem: Activity: Goal: Risk for activity intolerance will decrease Outcome: Progressing   Problem: Nutrition: Goal: Adequate nutrition will be maintained Outcome: Progressing   Problem: Coping: Goal: Level of anxiety will decrease Outcome: Progressing   Problem: Elimination: Goal: Will not experience complications related to bowel motility Outcome: Progressing Goal: Will not experience complications related to urinary retention Outcome: Progressing   Problem: Pain Management: Goal: General experience of comfort will improve Outcome: Progressing   Problem: Safety: Goal: Ability to remain free from injury will improve Outcome: Progressing   Problem: Skin Integrity: Goal: Risk for impaired skin integrity will decrease Outcome: Progressing   Problem: Education: Goal: Ability to demonstrate management of disease process will improve Outcome: Progressing Goal: Ability to verbalize understanding of medication therapies will improve Outcome: Progressing Goal: Individualized Educational Video(s) Outcome: Progressing   Problem: Activity: Goal: Capacity to carry out activities will improve Outcome:  Progressing   Problem: Cardiac: Goal: Ability to achieve and maintain adequate cardiopulmonary perfusion will improve Outcome: Progressing

## 2023-05-24 NOTE — Plan of Care (Signed)

## 2023-05-24 NOTE — Progress Notes (Signed)
Mobility Specialist Progress Note:   05/24/23 1210  Mobility  Activity Ambulated independently in hallway  Level of Assistance Independent  Assistive Device None  Distance Ambulated (ft) 500 ft  Activity Response Tolerated well  Mobility Referral Yes  Mobility visit 1 Mobility  Mobility Specialist Start Time (ACUTE ONLY) 1210  Mobility Specialist Stop Time (ACUTE ONLY) 1219  Mobility Specialist Time Calculation (min) (ACUTE ONLY) 9 min   Pt eager for mobility session. Ambulated independently on RA throughout session (94-97%). Pt did c/o minor SOB at EOS, however. Pt back in room with all needs met. Encouraged to frequently ambulate!  Addison Lank Mobility Specialist Please contact via SecureChat or  Rehab office at [phone]

## 2023-05-24 NOTE — Progress Notes (Signed)
[facility] PROGRESS NOTE   Pamela [name] UEA:[id number] DOB: 11-17-1985 DOA: 05/22/2023  PCP: Pcp, No  Brief History: 37 y.o. female with medical history significant of failure with preserved EF and Graves' disease initially diagnosed back in 2023 previously on methimazole who presented with complaints of having severe palpitations, chest pain, and shortness of breath.  Patient noted to have labs consistent with hyperthyroidism.  Symptoms thought to be secondary to same.  Patient had not been taking any of her thyroid medications in a long time.  Initially attempts were made to transfer her to [facility].  Due to long wait for beds she was admitted here.    Consultants: None  Procedures: None    Subjective/Interval History: Patient mentions that she feels better this morning.  No chest pain.  Shortness of breath is improved though she has not really ambulated much.  No nausea or vomiting.    Assessment/Plan:  Graves' disease TSH was less than 0.1 and free T4 was greater than 5.5.  Patient had not been taking her thyroid medications in a while.  She was started on propranolol and methimazole.  Heart rate has improved.  Symptoms seems to be improving.  She mentioned that she follows up with endocrinology in [place] and plans to do the same at discharge. She promises to be compliant with medications going forward. Hesitant about considering thyroidectomy.  Chronic diastolic CHF Last echo from 2023 showed normal left ventricular systolic function.  Due to her symptoms of dyspnea echocardiogram has been ordered and is pending.  No evidence for volume overload currently.  Mediastinal mass/lymphadenopathy This is a known entity.  Used to follow-up with CT surgery but not recently.  Can be further addressed by her primary care provider.  Normocytic anemia No evidence of overt bleeding.  Class III obesity Estimated body mass index is 43.12 kg/m as calculated from the  following:   Height as of this encounter: 5\' 10"  (1.778 m).   Weight as of this encounter: 136.3 kg.   DVT Prophylaxis: Lovenox Code Status: Full code Family Communication: Discussed with patient Disposition Plan: Hopefully discharge in 24 to 48 hours  Status is: Inpatient Remains inpatient appropriate because: Hyperthyroidism with symptoms      Medications: Scheduled:  enoxaparin (LOVENOX) injection  40 mg Subcutaneous Q24H   furosemide  40 mg Oral BID   methIMAzole  10 mg Oral BID   propranolol  20 mg Oral BID   sodium chloride flush  3 mL Intravenous Q12H   Continuous: YNW:[id number], ondansetron (ZOFRAN) IV, sodium chloride flush  Antibiotics: Anti-infectives (From admission, onward)    None       Objective:  Vital Signs  Vitals:   05/23/23 1342 05/23/23 1900 05/24/23 0450 05/24/23 0801  BP: 135/75 (!) 142/88 (!) 147/59 136/89  Pulse:  89 89 92  Resp:  16  16  Temp: 98.1 F (36.7 C) 97.9 F (36.6 C) 97.8 F (36.6 C) 97.9 F (36.6 C)  TempSrc: Oral Oral Oral Oral  SpO2: 100% 100% 98%   Weight:      Height:       No intake or output data in the 24 hours ending 05/24/23 0910 Filed Weights   05/22/23 1927  Weight: (!) 136.3 kg    General appearance: Awake alert.  In no distress Resp: Clear to auscultation bilaterally.  Normal effort Cardio: S1-S2 is normal regular.  No S3-S4.  No rubs murmurs or bruit GI: Abdomen is soft.  Nontender nondistended.  Bowel  sounds are present normal.  No masses organomegaly Extremities: No edema.  Full range of motion of lower extremities. Neurologic: Alert and oriented x3.  No focal neurological deficits.    Lab Results:  Data Reviewed: I have personally reviewed following labs and reports of the imaging studies  CBC: Recent Labs  Lab 05/22/23 1509  WBC 9.4  HGB 11.8*  HCT 36.0  MCV 86.3  PLT 300    Basic Metabolic Panel: Recent Labs  Lab 05/22/23 1509 05/24/23 0353  NA 138 138  K 3.6 3.7  CL  106 105  CO2 24 24  GLUCOSE 110* 98  BUN 10 8  CREATININE 0.31* 0.36*  CALCIUM 9.0 9.0    GFR: Estimated Creatinine Clearance: 148.1 mL/min (A) (by C-G formula based on SCr of 0.36 mg/dL (L)).    Thyroid Function Tests: Recent Labs    05/22/23 1732 05/23/23 1418  TSH <0.010* <0.010*  FREET4 >5.50*  --      Radiology Studies: CT Angio Chest PE W and/or Wo Contrast Result Date: 05/22/2023 CLINICAL DATA:  Chest pain and right arm pain. PE suspected. Shortness of breath. EXAM: CT ANGIOGRAPHY CHEST WITH CONTRAST TECHNIQUE: Multidetector CT imaging of the chest was performed using the standard protocol during bolus administration of intravenous contrast. Multiplanar CT image reconstructions and MIPs were obtained to evaluate the vascular anatomy. RADIATION DOSE REDUCTION: This exam was performed according to the departmental dose-optimization program which includes automated exposure control, adjustment of the mA and/or kV according to patient size and/or use of iterative reconstruction technique. CONTRAST:  75mL OMNIPAQUE IOHEXOL 350 MG/ML SOLN COMPARISON:  Same-day radiograph and CT chest 09/02/2021 FINDINGS: Cardiovascular: Negative for acute pulmonary embolism. Normal caliber thoracic aorta. No pericardial effusion. Mediastinum/Nodes: Trachea and esophagus are unremarkable. Decreased soft tissue density in the anterior superior mediastinum. Decreased mediastinal lymphadenopathy. For example right pretracheal node now measures 6 mm, previously 13 mm and 8 mm left hilar node previously measured 14 mm. Lungs/Pleura: No focal consolidation, pleural effusion, or pneumothorax. Upper Abdomen: No acute abnormality. Musculoskeletal: No acute fracture. Review of the MIP images confirms the above findings. IMPRESSION: 1. Negative for acute pulmonary embolism. No acute abnormality in the chest. 2. Decreased soft tissue density in the anterior superior mediastinum and decreased mediastinal lymphadenopathy  compared with 09/02/2021. Electronically Signed   By: Minerva Fester M.D.   On: 05/22/2023 18:50   DG Chest 2 View Result Date: 05/22/2023 CLINICAL DATA:  Chest pain EXAM: CHEST - 2 VIEW COMPARISON:  11/13/2021 FINDINGS: The heart size and mediastinal contours are within normal limits. Both lungs are clear. The visualized skeletal structures are unremarkable. IMPRESSION: No active cardiopulmonary disease. Electronically Signed   By: Minerva Fester M.D.   On: 05/22/2023 18:43       LOS: 1 day   Alexiz Cothran  [facility] Pager on [email or web address]  05/24/2023, 9:10 AM

## 2023-05-24 NOTE — Progress Notes (Signed)
At bedside to place PIV per consult.  Pt pleasantly declines placement at this time.  No IV meds ordered at this time. Secure chat sent to Dr Rito Ehrlich and Luan Moore RN to notify.

## 2023-05-25 ENCOUNTER — Other Ambulatory Visit (HOSPITAL_COMMUNITY): Payer: BC Managed Care – PPO

## 2023-05-25 DIAGNOSIS — E0500 Thyrotoxicosis with diffuse goiter without thyrotoxic crisis or storm: Secondary | ICD-10-CM | POA: Diagnosis not present

## 2023-05-25 LAB — CBC
HCT: 36.4 % (ref 36.0–46.0)
Hemoglobin: 11.7 g/dL — ABNORMAL LOW (ref 12.0–15.0)
MCH: 27.7 pg (ref 26.0–34.0)
MCHC: 32.1 g/dL (ref 30.0–36.0)
MCV: 86.1 fL (ref 80.0–100.0)
Platelets: 304 10*3/uL (ref 150–400)
RBC: 4.23 MIL/uL (ref 3.87–5.11)
RDW: 11.6 % (ref 11.5–15.5)
WBC: 9.9 10*3/uL (ref 4.0–10.5)
nRBC: 0 % (ref 0.0–0.2)

## 2023-05-25 LAB — BASIC METABOLIC PANEL
Anion gap: 6 (ref 5–15)
BUN: 11 mg/dL (ref 6–20)
CO2: 24 mmol/L (ref 22–32)
Calcium: 9.1 mg/dL (ref 8.9–10.3)
Chloride: 107 mmol/L (ref 98–111)
Creatinine, Ser: 0.4 mg/dL — ABNORMAL LOW (ref 0.44–1.00)
GFR, Estimated: >60 mL/min (ref >60)
Glucose, Bld: 104 mg/dL — ABNORMAL HIGH (ref 70–99)
Potassium: 4.2 mmol/L (ref 3.5–5.1)
Sodium: 137 mmol/L (ref 135–145)

## 2023-05-25 MED ORDER — METHIMAZOLE 10 MG PO TABS: 10.0000 mg | ORAL_TABLET | Freq: Two times a day (BID) | ORAL | 2 refills | Status: DC

## 2023-05-25 MED ORDER — POTASSIUM CHLORIDE CRYS ER 20 MEQ PO TBCR: 20.0000 meq | EXTENDED_RELEASE_TABLET | Freq: Every day | ORAL | 1 refills | Status: DC

## 2023-05-25 MED ORDER — PROPRANOLOL HCL 20 MG PO TABS: 20.0000 mg | ORAL_TABLET | Freq: Two times a day (BID) | ORAL | 2 refills | Status: DC

## 2023-05-25 NOTE — Progress Notes (Signed)
Patient verbalized understanding of dc instructions. All belongings given to patient.

## 2023-05-25 NOTE — Progress Notes (Signed)
Patient refused flu shot and that she will get it from her primary doctors office.

## 2023-05-25 NOTE — Plan of Care (Signed)

## 2023-05-25 NOTE — Discharge Summary (Signed)
[facility]  Physician Discharge Summary   Patient ID: Pamela [name] MRN: [id number] DOB/AGE: 08-Sep-1985 37 y.o.  Admit date: 05/22/2023 Discharge date: 05/25/2023    PCP: Pcp, No  DISCHARGE DIAGNOSES:    Graves disease   Heart failure with preserved ejection fraction (HCC)   Mediastinal mass   Normocytic anemia   RECOMMENDATIONS FOR OUTPATIENT FOLLOW UP: Outpatient follow-up with endocrinology   Home Health: None Equipment/Devices: None  CODE STATUS: Full code  DISCHARGE CONDITION: fair  Diet recommendation: As before  INITIAL HISTORY: 37 y.o. female with medical history significant of failure with preserved EF and Graves' disease initially diagnosed back in 2023 previously on methimazole who presented with complaints of having severe palpitations, chest pain, and shortness of breath.  Patient noted to have labs consistent with hyperthyroidism.  Symptoms thought to be secondary to same.  Patient had not been taking any of her thyroid medications in a long time.  Initially attempts were made to transfer her to [facility].  Due to long wait for beds she was admitted here.    HOSPITAL COURSE:   Graves' disease TSH was less than 0.1 and free T4 was greater than 5.5.  Patient had not been taking her thyroid medications in a while.  She was started on propranolol and methimazole.  Heart rate has improved.  Symptoms seems to be improving.  She mentioned that she follows up with endocrinology in [place] and plans to do the same at discharge. She promises to be compliant with medications going forward. Hesitant about considering thyroidectomy.   Chronic diastolic CHF Last echo from 2023 showed normal left ventricular systolic function.  Dyspnea has resolved.  Most likely due to above.  Echocardiogram can be pursued in the outpatient setting.  No evidence for volume overload currently.   Mediastinal mass/lymphadenopathy This is a known entity.  Used to follow-up with CT  surgery but not recently.  Can be further addressed by her primary care provider.   Normocytic anemia No evidence of overt bleeding.   Class III obesity Estimated body mass index is 43.12 kg/m as calculated from the following:   Height as of this encounter: 5\' 10"  (1.778 m).   Weight as of this encounter: 136.3 kg.  Patient very keen on going home today.  Okay for discharge.   PERTINENT LABS:  The results of significant diagnostics from this hospitalization (including imaging, microbiology, ancillary and laboratory) are listed below for reference.     Labs:   Basic Metabolic Panel: Recent Labs  Lab 05/22/23 1509 05/24/23 0353 05/25/23 0312  NA 138 138 137  K 3.6 3.7 4.2  CL 106 105 107  CO2 24 24 24   GLUCOSE 110* 98 104*  BUN 10 8 11   CREATININE 0.31* 0.36* 0.40*  CALCIUM 9.0 9.0 9.1    CBC: Recent Labs  Lab 05/22/23 1509 05/25/23 0312  WBC 9.4 9.9  HGB 11.8* 11.7*  HCT 36.0 36.4  MCV 86.3 86.1  PLT 300 304    BNP: BNP (last 3 results) Recent Labs    05/22/23 1509  BNP 81.7    IMAGING STUDIES CT Angio Chest PE W and/or Wo Contrast Result Date: 05/22/2023 CLINICAL DATA:  Chest pain and right arm pain. PE suspected. Shortness of breath. EXAM: CT ANGIOGRAPHY CHEST WITH CONTRAST TECHNIQUE: Multidetector CT imaging of the chest was performed using the standard protocol during bolus administration of intravenous contrast. Multiplanar CT image reconstructions and MIPs were obtained to evaluate the vascular anatomy. RADIATION DOSE REDUCTION:  This exam was performed according to the departmental dose-optimization program which includes automated exposure control, adjustment of the mA and/or kV according to patient size and/or use of iterative reconstruction technique. CONTRAST:  75mL OMNIPAQUE IOHEXOL 350 MG/ML SOLN COMPARISON:  Same-day radiograph and CT chest 09/02/2021 FINDINGS: Cardiovascular: Negative for acute pulmonary embolism. Normal caliber thoracic  aorta. No pericardial effusion. Mediastinum/Nodes: Trachea and esophagus are unremarkable. Decreased soft tissue density in the anterior superior mediastinum. Decreased mediastinal lymphadenopathy. For example right pretracheal node now measures 6 mm, previously 13 mm and 8 mm left hilar node previously measured 14 mm. Lungs/Pleura: No focal consolidation, pleural effusion, or pneumothorax. Upper Abdomen: No acute abnormality. Musculoskeletal: No acute fracture. Review of the MIP images confirms the above findings. IMPRESSION: 1. Negative for acute pulmonary embolism. No acute abnormality in the chest. 2. Decreased soft tissue density in the anterior superior mediastinum and decreased mediastinal lymphadenopathy compared with 09/02/2021. Electronically Signed   By: Minerva Fester M.D.   On: 05/22/2023 18:50   DG Chest 2 View Result Date: 05/22/2023 CLINICAL DATA:  Chest pain EXAM: CHEST - 2 VIEW COMPARISON:  11/13/2021 FINDINGS: The heart size and mediastinal contours are within normal limits. Both lungs are clear. The visualized skeletal structures are unremarkable. IMPRESSION: No active cardiopulmonary disease. Electronically Signed   By: Minerva Fester M.D.   On: 05/22/2023 18:43    DISCHARGE EXAMINATION: Vitals:   05/24/23 2135 05/25/23 0104 05/25/23 0338 05/25/23 0945  BP: (!) 144/80  132/68 (!) 149/55  Pulse: 84  86 93  Resp:  16 16   Temp:  97.9 F (36.6 C) 98 F (36.7 C)   TempSrc:  Oral Oral   SpO2:  98% 97% 97%  Weight:      Height:       General appearance: Awake alert.  In no distress Resp: Clear to auscultation bilaterally.  Normal effort Cardio: S1-S2 is normal regular.  No S3-S4.  No rubs murmurs or bruit GI: Abdomen is soft.  Nontender nondistended.  Bowel sounds are present normal.  No masses organomegaly   DISPOSITION: Home  Discharge Instructions     Call MD for:  difficulty breathing, headache or visual disturbances   Complete by: As directed    Call MD for:   extreme fatigue   Complete by: As directed    Call MD for:  persistant dizziness or light-headedness   Complete by: As directed    Call MD for:  persistant nausea and vomiting   Complete by: As directed    Call MD for:  severe uncontrolled pain   Complete by: As directed    Call MD for:  temperature >100.4   Complete by: As directed    Diet - low sodium heart healthy   Complete by: As directed    Discharge instructions   Complete by: As directed    Please take your medications as prescribed.  Please be sure to follow-up with your endocrinologist for further management of your hyperthyroidism / Graves' disease.  You were cared for by a hospitalist during your hospital stay. If you have any questions about your discharge medications or the care you received while you were in the hospital after you are discharged, you can call the unit and asked to speak with the hospitalist on call if the hospitalist that took care of you is not available. Once you are discharged, your primary care physician will handle any further medical issues. Please note that NO REFILLS for any discharge medications  will be authorized once you are discharged, as it is imperative that you return to your primary care physician (or establish a relationship with a primary care physician if you do not have one) for your aftercare needs so that they can reassess your need for medications and monitor your lab values. If you do not have a primary care physician, you can call [phone] for a physician referral.   Increase activity slowly   Complete by: As directed          Allergies as of 05/25/2023       Reactions   Ceftriaxone Hives, Shortness Of Breath, Itching   Rocephin; Penicillins OK   Lactose Intolerance (gi) Other (See Comments)   GI upset   Latex Itching, Other (See Comments)   discoloring of skin   Trazodone And Nefazodone Other (See Comments)   Causes nightmares        Medication List     STOP taking  these medications    valACYclovir 1000 MG tablet Commonly known as: VALTREX       TAKE these medications    furosemide 40 MG tablet Commonly known as: LASIX Take 1 tablet (40 mg total) by mouth 2 (two) times daily.   GOODYS BODY PAIN PO Take 1 packet by mouth 2 (two) times daily as needed (headache/pain).   Hair Skin and Nails Formula Tabs Take 1 tablet by mouth daily.   ibuprofen 200 MG tablet Commonly known as: ADVIL Take 400 mg by mouth every 6 (six) hours as needed for mild pain (pain score 1-3) or moderate pain (pain score 4-6).   LYSINE PO Take 1 tablet by mouth every Monday, Wednesday, and Friday.   methimazole 10 MG tablet Commonly known as: TAPAZOLE Take 1 tablet (10 mg total) by mouth 2 (two) times daily.   OREGANO PO Take 1 capsule by mouth daily.   potassium chloride SA 20 MEQ tablet Commonly known as: KLOR-CON M Take 1 tablet (20 mEq total) by mouth daily.   propranolol 20 MG tablet Commonly known as: INDERAL Take 1 tablet (20 mg total) by mouth 2 (two) times daily.           TOTAL DISCHARGE TIME: 35 minutes  Verlia Kaney [employer] on [email or web address]  05/26/2023, 10:46 AM

## 2023-05-26 ENCOUNTER — Other Ambulatory Visit: Payer: Self-pay | Admitting: Cardiology

## 2023-05-30 ENCOUNTER — Telehealth: Payer: Self-pay | Admitting: *Deleted

## 2023-05-30 DIAGNOSIS — Z8349 Family history of other endocrine, nutritional and metabolic diseases: Secondary | ICD-10-CM

## 2023-05-30 DIAGNOSIS — E0590 Thyrotoxicosis, unspecified without thyrotoxic crisis or storm: Secondary | ICD-10-CM

## 2023-05-30 NOTE — Telephone Encounter (Signed)
Patient left a message that she needed to talk to [facility], She is wanting to move forward with her Thyroid Surgery. She states that she was recently hospitalized for 4 days as it is now affecting her heart.

## 2023-06-02 NOTE — Telephone Encounter (Signed)
Patient was called and a message was left. 

## 2023-06-02 NOTE — Addendum Note (Signed)
Addended by: Dani Gobble on: 06/02/2023 07:33 AM   Modules accepted: Orders

## 2023-06-02 NOTE — Telephone Encounter (Signed)
I can certainly put in a referral but they will not perform surgery until her thyroid labs are somewhat under control.  I see she was put on Methimazole and Propanolol during the hospitalization and I do recommend she continue those for now and repeat labs in 4 weeks to assess how her thyroid has responded.  If it has stabilized, we can then initiate referral.

## 2023-06-12 ENCOUNTER — Ambulatory Visit: Payer: BC Managed Care – PPO | Admitting: Adult Health

## 2023-06-16 ENCOUNTER — Ambulatory Visit: Payer: BC Managed Care – PPO | Admitting: Adult Health

## 2023-06-16 ENCOUNTER — Encounter: Payer: Self-pay | Admitting: Adult Health

## 2023-06-16 VITALS — BP 155/94 | HR 96 | Ht 70.0 in | Wt 288.0 lb

## 2023-06-16 DIAGNOSIS — I10 Essential (primary) hypertension: Secondary | ICD-10-CM | POA: Diagnosis not present

## 2023-06-16 DIAGNOSIS — Z8679 Personal history of other diseases of the circulatory system: Secondary | ICD-10-CM

## 2023-06-16 DIAGNOSIS — Z7689 Persons encountering health services in other specified circumstances: Secondary | ICD-10-CM | POA: Diagnosis not present

## 2023-06-16 DIAGNOSIS — Z30431 Encounter for routine checking of intrauterine contraceptive device: Secondary | ICD-10-CM | POA: Diagnosis not present

## 2023-06-16 NOTE — Progress Notes (Signed)
  Subjective:     Patient ID: Pamela [name], female   DOB: 01/06/1986, 38 y.o.   MRN: [id number]  HPI [name] is a 38 year old black female, single, [id number] in for IUD check, mirena  was placed 05/15/23. Has had some spotting and cramping.      Component Value Date/Time   DIAGPAP  03/31/2023 1432    - Negative for Intraepithelial Lesions or Malignancy (NILM)   DIAGPAP - Benign reactive/reparative changes 03/31/2023 1432   DIAGPAP - Low grade squamous intraepithelial lesion (LSIL) (A) 02/18/2022 1145   HPVHIGH Negative 03/31/2023 1432   HPVHIGH Positive (A) 02/18/2022 1145   HPVHIGH Positive (A) 02/16/2021 1248   ADEQPAP  03/31/2023 1432    Satisfactory for evaluation; transformation zone component PRESENT.   ADEQPAP  02/18/2022 1145    Satisfactory for evaluation; transformation zone component PRESENT.   ADEQPAP  02/16/2021 1248    Satisfactory for evaluation; transformation zone component ABSENT.   No PCP  Review of Systems Has had some spotting and cramping with IUD No sex yet Reviewed past medical,surgical, social and family history. Reviewed medications and allergies.     Objective:   Physical Exam BP (!) 155/94 (BP Location: Right Arm, Patient Position: Sitting, Cuff Size: Normal)   Pulse 96   Ht 5' 10 (1.778 m)   Wt 288 lb (130.6 kg)   BMI 41.32 kg/m   Skin warm and dry.Pelvic: external genitalia is normal in appearance no lesions, vagina: pink,urethra has no lesions or masses noted, cervix:smooth and bulbous,+IUD strings at os, uterus: normal size, shape and contour, non tender, no masses felt, adnexa: no masses or tenderness noted. Bladder is non tender and no masses felt.   Fall risk is moderate  Upstream - 06/16/23 0901       Pregnancy Intention Screening   Does the patient want to become pregnant in the next year? No    Does the patient's partner want to become pregnant in the next year? No    Would the patient like to discuss contraceptive options today? No       Contraception Wrap Up   Current Method IUD or IUS    End Method IUD or IUS    Contraception Counseling Provided Yes            Examination chaperoned by Clarita Salt LPN  Assessment:     1. IUD check up (Primary) +strings at os Has had some spotting and cramping Mirena  placed 05/15/23  2. Hypertension, unspecified type Take meds, and keep check on BP Will refer to PCP  3. Establishing care with new doctor, encounter for Referred to Gloria Zarwolo NP, needs PCP - Ambulatory Referral to Primary Care  4. History of CHF (congestive heart failure) Sees cardiologist      Plan:     GYN physical in 1 year, or return sooner if needed

## 2023-07-05 ENCOUNTER — Other Ambulatory Visit: Payer: Self-pay | Admitting: Adult Health

## 2023-07-06 ENCOUNTER — Other Ambulatory Visit: Payer: Self-pay | Admitting: Adult Health

## 2023-07-17 ENCOUNTER — Telehealth: Payer: Self-pay

## 2023-07-17 NOTE — Telephone Encounter (Signed)
LVM to call and schedule new patient appt

## 2023-07-17 NOTE — Telephone Encounter (Signed)
Copied from CRM [id number]. Topic: Appointments - Scheduling Inquiry for Clinic >> Jul 17, 2023 11:24 AM Antwanette L wrote: Reason for CRM: Patient was referred by Cyril Mourning to see Gilmore Laroche. Patient was accidentally scheduled for the [place] office. I called the patient back but I never got a response. Please contact patient at [phone] to schedule an appointment.

## 2023-07-21 ENCOUNTER — Ambulatory Visit: Payer: BC Managed Care – PPO | Admitting: Family Medicine

## 2023-09-24 DIAGNOSIS — R070 Pain in throat: Secondary | ICD-10-CM | POA: Diagnosis not present

## 2023-09-24 DIAGNOSIS — R051 Acute cough: Secondary | ICD-10-CM | POA: Diagnosis not present

## 2023-09-24 DIAGNOSIS — Z20822 Contact with and (suspected) exposure to covid-19: Secondary | ICD-10-CM | POA: Diagnosis not present

## 2023-10-12 ENCOUNTER — Other Ambulatory Visit: Payer: Self-pay | Admitting: Adult Health

## 2023-11-14 DIAGNOSIS — T7421XA Adult sexual abuse, confirmed, initial encounter: Secondary | ICD-10-CM | POA: Diagnosis not present

## 2023-11-14 DIAGNOSIS — N898 Other specified noninflammatory disorders of vagina: Secondary | ICD-10-CM | POA: Diagnosis not present

## 2023-12-21 ENCOUNTER — Other Ambulatory Visit: Payer: Self-pay | Admitting: Adult Health

## 2024-01-15 DIAGNOSIS — H5213 Myopia, bilateral: Secondary | ICD-10-CM | POA: Diagnosis not present

## 2024-01-28 ENCOUNTER — Other Ambulatory Visit: Payer: Self-pay | Admitting: Adult Health

## 2024-03-10 ENCOUNTER — Ambulatory Visit (INDEPENDENT_AMBULATORY_CARE_PROVIDER_SITE_OTHER): Admitting: *Deleted

## 2024-03-10 ENCOUNTER — Other Ambulatory Visit (HOSPITAL_COMMUNITY)
Admission: RE | Admit: 2024-03-10 | Discharge: 2024-03-10 | Disposition: A | Discharge status: Home / Self Care | Source: Ambulatory Visit | Attending: Obstetrics & Gynecology | Admitting: Obstetrics & Gynecology

## 2024-03-10 DIAGNOSIS — Z113 Encounter for screening for infections with a predominantly sexual mode of transmission: Secondary | ICD-10-CM | POA: Insufficient documentation

## 2024-03-10 DIAGNOSIS — N898 Other specified noninflammatory disorders of vagina: Secondary | ICD-10-CM | POA: Insufficient documentation

## 2024-03-10 NOTE — Progress Notes (Signed)
   NURSE VISIT- VAGINITIS/STD/POC  SUBJECTIVE:  Pamela [name] is a 38 y.o. [id number] GYN patientfemale here for a vaginal swab for vaginitis screening and std screen.  She reports the following symptoms: discharge described as white and curd-like and vulvar itching for 2 weeks. Denies abnormal vaginal bleeding, significant pelvic pain, fever, or UTI symptoms.  OBJECTIVE:  There were no vitals taken for this visit.  Appears well, in no apparent distress  ASSESSMENT: Vaginal swab for vaginitis screening and std screen  PLAN: Self-collected vaginal probe for Gonorrhea, Chlamydia, Trichomonas, Bacterial Vaginosis, Yeast sent to lab Treatment: to be determined once results are received Follow-up as needed if symptoms persist/worsen, or new symptoms develop  Alan LITTIE Fischer  03/10/2024 8:59 AM

## 2024-03-11 ENCOUNTER — Ambulatory Visit: Payer: Self-pay | Admitting: Adult Health

## 2024-03-11 LAB — CERVICOVAGINAL ANCILLARY ONLY
Bacterial Vaginitis (gardnerella): NEGATIVE
Candida Glabrata: NEGATIVE
Candida Vaginitis: NEGATIVE
Chlamydia: NEGATIVE
Comment: NEGATIVE
Comment: NEGATIVE
Comment: NEGATIVE
Comment: NEGATIVE
Comment: NEGATIVE
Comment: NORMAL
Neisseria Gonorrhea: NEGATIVE
Trichomonas: NEGATIVE

## 2024-03-12 ENCOUNTER — Other Ambulatory Visit: Payer: Self-pay | Admitting: Adult Health

## 2024-03-12 MED ORDER — FLUCONAZOLE 150 MG PO TABS: ORAL_TABLET | ORAL | 1 refills | Status: AC

## 2024-03-12 NOTE — Progress Notes (Signed)
 Rx diflucan.

## 2024-04-15 ENCOUNTER — Ambulatory Visit: Payer: Self-pay

## 2024-04-15 NOTE — Telephone Encounter (Addendum)
 See resources listed below if pt returns call. One voicemail left. **Please route to [facility] after 3 attempts or if call completed as that is where her children are patients**  Answer Assessment - Initial Assessment Questions 1. REASON FOR CALL: What is the main reason for your call? or How can I best help you?  Patient is experiencing food insecurity and loss of electricity. States [employer] refused her request for a one day extension and power has been off x 1 hour.  This RN utilized [place] to locate food assistance and utility assistance for patient.  Provided telephone number and location for 2 food resources. Pt disconnected prior to relaying of information regarding utility assistance.  Left VM requesting pt call back main number. Included [email or web address] website in voicemail.  Some resources found:  [place] [employer] Commission: 260-796-4821 or [phone]      *In [place], Nov 1-March 31, company must agree to payment plan  [email or web address]  [email or web address]  LIHE[employer] (310)[phone]  Protocols used: Information Only Call - No Triage-A-AH Copied from CRM [id number]. Topic: Clinical - Red Word Triage >> Apr 15, 2024  2:03 PM [name] wrote: Red Word that prompted transfer to Nurse Triage: The patient called in very depressed and upset stating her power has been turned off and she doesn't know what to do because this has never happened. She has contacted [employer] and [employer] and has had no assistance. I will transfer her to [facility] NT

## 2024-04-15 NOTE — Telephone Encounter (Signed)
 Attempted contact x 3 to discuss loss of power from [employer] and Depression; LVM to return call; Cannot route to clinic as patient is not Established with [place].     Copied from CRM #[id number]. Topic: Clinical - Red Word Triage >> Apr 15, 2024  2:03 PM [name] wrote: Red Word that prompted transfer to Nurse Triage: The patient called in very depressed and upset stating her power has been turned off and she doesn't know what to do because this has never happened. She has contacted [employer] and [employer] and has had no assistance. I will transfer her to [facility] NT

## 2024-04-15 NOTE — Telephone Encounter (Signed)
 Attempted contact x 2 to discuss loss of power and Depression; LVM to return call; will attempt contact at a later time.    See resources listed below if pt returns call. One voicemail left. **Please route to [facility] after 3 attempts or if call completed as that is where her children are patients**   Answer Assessment - Initial Assessment Questions 1. REASON FOR CALL: What is the main reason for your call? or How can I best help you?  Patient is experiencing food insecurity and loss of electricity. States [employer] refused her request for a one day extension and power has been off x 1 hour.  This RN utilized [employer] to locate food assistance and utility assistance for patient.  Provided telephone number and location for 2 food resources. Pt disconnected prior to relaying of information regarding utility assistance.  Left VM requesting pt call back main number. Included [email or web address] website in voicemail.  Some resources found:  [place] [employer] Commission: 305-381-6788 or [phone]      *In [place], Nov 1-March 31, company must agree to payment plan  [email or web address]  [email or web address]  LIHEAP [phone]  Protocols used: Information Only Call - No Triage-A-AH Copied from CRM [id number]. Topic: Clinical - Red Word Triage >> Apr 15, 2024  2:03 PM [name] wrote: Red Word that prompted transfer to Nurse Triage: The patient called in very depressed and upset stating her power has been turned off and she doesn't know what to do because this has never happened. She has contacted [employer] and [employer] and has had no assistance. I will transfer her to [facility] NT

## 2024-04-21 ENCOUNTER — Other Ambulatory Visit: Payer: Self-pay | Admitting: Adult Health

## 2024-05-17 ENCOUNTER — Other Ambulatory Visit: Payer: Self-pay | Admitting: Women's Health

## 2024-05-18 ENCOUNTER — Other Ambulatory Visit: Payer: Self-pay | Admitting: Adult Health

## 2024-05-18 MED ORDER — VALACYCLOVIR HCL 1 G PO TABS: 1000.0000 mg | ORAL_TABLET | Freq: Two times a day (BID) | ORAL | 1 refills | Status: AC

## 2024-05-18 NOTE — Progress Notes (Signed)
 Refilled valtrex

## 2024-06-30 ENCOUNTER — Telehealth: Payer: Self-pay | Admitting: *Deleted

## 2024-06-30 ENCOUNTER — Ambulatory Visit: Admitting: Cardiology

## 2024-06-30 ENCOUNTER — Encounter: Payer: Self-pay | Admitting: Cardiology

## 2024-06-30 VITALS — BP 151/81 | HR 105 | Ht 70.0 in | Wt 304.2 lb

## 2024-06-30 DIAGNOSIS — Z79899 Other long term (current) drug therapy: Secondary | ICD-10-CM | POA: Diagnosis not present

## 2024-06-30 DIAGNOSIS — R0609 Other forms of dyspnea: Secondary | ICD-10-CM

## 2024-06-30 DIAGNOSIS — I5031 Acute diastolic (congestive) heart failure: Secondary | ICD-10-CM | POA: Diagnosis not present

## 2024-06-30 DIAGNOSIS — E039 Hypothyroidism, unspecified: Secondary | ICD-10-CM

## 2024-06-30 DIAGNOSIS — Z13220 Encounter for screening for lipoid disorders: Secondary | ICD-10-CM

## 2024-06-30 DIAGNOSIS — Z7689 Persons encountering health services in other specified circumstances: Secondary | ICD-10-CM | POA: Diagnosis not present

## 2024-06-30 LAB — COMPREHENSIVE METABOLIC PANEL WITH GFR
ALT: 18 [IU]/L (ref 0–32)
AST: 16 [IU]/L (ref 0–40)
Albumin: 3.6 g/dL — ABNORMAL LOW (ref 3.9–4.9)
Alkaline Phosphatase: 87 [IU]/L (ref 41–116)
BUN/Creatinine Ratio: 29 — ABNORMAL HIGH (ref 9–23)
BUN: 10 mg/dL (ref 6–20)
Bilirubin Total: 0.4 mg/dL (ref 0.0–1.2)
CO2: 23 mmol/L (ref 20–29)
Calcium: 9.2 mg/dL (ref 8.7–10.2)
Chloride: 106 mmol/L (ref 96–106)
Creatinine, Ser: 0.35 mg/dL — ABNORMAL LOW (ref 0.57–1.00)
Globulin, Total: 2.6 g/dL (ref 1.5–4.5)
Glucose: 96 mg/dL (ref 70–99)
Potassium: 4.4 mmol/L (ref 3.5–5.2)
Sodium: 141 mmol/L (ref 134–144)
Total Protein: 6.2 g/dL (ref 6.0–8.5)
eGFR: 136 mL/min/{1.73_m2}

## 2024-06-30 LAB — LIPID PANEL
Chol/HDL Ratio: 3.7 ratio (ref 0.0–4.4)
Cholesterol, Total: 142 mg/dL (ref 100–199)
HDL: 38 mg/dL — ABNORMAL LOW
LDL Chol Calc (NIH): 88 mg/dL (ref 0–99)
Triglycerides: 84 mg/dL (ref 0–149)
VLDL Cholesterol Cal: 16 mg/dL (ref 5–40)

## 2024-06-30 LAB — MAGNESIUM: Magnesium: 1.7 mg/dL (ref 1.6–2.3)

## 2024-06-30 LAB — TSH+T4F+T3FREE
Free T4: 5.96 ng/dL — ABNORMAL HIGH (ref 0.82–1.77)
T3, Free: 22 pg/mL (ref 2.0–4.4)
TSH: <0.005 u[IU]/mL — ABNORMAL LOW (ref 0.450–4.500)

## 2024-06-30 MED ORDER — PROPRANOLOL HCL 40 MG PO TABS: 40.0000 mg | ORAL_TABLET | Freq: Two times a day (BID) | ORAL | 3 refills | Status: AC

## 2024-06-30 NOTE — Patient Instructions (Addendum)
 Medication Instructions:  Your physician has recommended you make the following change in your medication:  START: Propranolol  40 mg twice daily *If you need a refill on your cardiac medications before your next appointment, please call your pharmacy*  Lab Work: CMET, Mag, Lipids, TSH If you have labs (blood work) drawn today and your tests are completely normal, you will receive your results only by: MyChart Message (if you have MyChart) OR A paper copy in the mail If you have any lab test that is abnormal or we need to change your treatment, we will call you to review the results.  Testing/Procedures: Your physician has requested that you have an echocardiogram. Echocardiography is a painless test that uses sound waves to create images of your heart. It provides your doctor with information about the size and shape of your heart and how well your hearts chambers and valves are working. This procedure takes approximately one hour. There are no restrictions for this procedure. Please do NOT wear cologne, perfume, aftershave, or lotions (deodorant is allowed). Please arrive 15 minutes prior to your appointment time.  Please note: We ask at that you not bring children with you during ultrasound (echo/ vascular) testing. Due to room size and safety concerns, children are not allowed in the ultrasound rooms during exams. Our front office staff cannot provide observation of children in our lobby area while testing is being conducted. An adult accompanying a patient to their appointment will only be allowed in the ultrasound room at the discretion of the ultrasound technician under special circumstances. We apologize for any inconvenience.   Follow-Up: At [facility], you and your health needs are our priority.  As part of our continuing mission to provide you with exceptional heart care, our providers are all part of one team.  This team includes your primary Cardiologist (physician) and  Advanced Practice Providers or APPs (Physician Assistants and Nurse Practitioners) who all work together to provide you with the care you need, when you need it.  Your next appointment:   6 month(s)  Provider:   Kardie Tobb, DO    Other Instructions: [facility] [address] [place],  [place]  [address]  Get Driving Directions Main: [phone]  Fax: [phone]

## 2024-06-30 NOTE — Telephone Encounter (Signed)
 Looks like cardiology ordered some thyroid  labs.  We need those before scheduling a follow up.

## 2024-06-30 NOTE — Telephone Encounter (Signed)
 Patient left a Vm that she wants to make appointment with [facility] and would appreciate a call back.

## 2024-06-30 NOTE — Progress Notes (Signed)
 " Cardiology Office Note:    Date:  07/03/2024   ID:  Pamela [name], DOB 02/21/1986, MRN [id number]  PCP:  Pcp, No  Cardiologist:  Dub Huntsman, DO  Electrophysiologist:  None   Referring MD: No ref. provider found    I am was depressed and did not want to deal with the fact that I had heart problems  History of Present Illness:    Pamela [name] is a 39 y.o. female with a hx of chronic diastolic heart failure, Graves' disease, anxiety, depression here today for follow-up visit.  She is with her sister  Since her visit in 2023 she had not follow-up.  She said recently she has been experiencing shortness of breath.  She notes that she stopped following up because she got significantly stressed and felt as if that she was too young for the problems that she was going.  Past Medical History:  Diagnosis Date   Abnormal antenatal AFP screen    US  and [place] was low risk.   Anxiety    Congestive heart disease (HCC)    Congestive heart failure (CHF) (HCC)    Genital herpes    Gonorrhea    Graves disease    History of abnormal Pap smear    History of postpartum depression, currently pregnant    HSV-2 infection complicating pregnancy    Will suppress at 34 weeks. Don't discuss in front of FOB.   Mental disorder    hx pp depression now anxiety   Papanicolaou smear of cervix with positive high risk human papilloma virus (HPV) test 02/26/2021   02/26/21 repeat pap in 1 year per [facility], 5 year risk for CIN3+ is 4.8%   Pregnant    Tobacco abuse 01/10/2012    Past Surgical History:  Procedure Laterality Date   CESAREAN SECTION  03/04/2011   Procedure: CESAREAN SECTION;  Surgeon: Burnard HILARIO Pate, MD;  Location: [facility];  Service: Gynecology;  Laterality: N/A;  primary of baby  boy at 62   CESAREAN SECTION N/A 10/27/2012   Procedure: CESAREAN SECTION;  Surgeon: Harland JAYSON Birkenhead, MD;  Location: [facility];  Service: Obstetrics;  Laterality: N/A;   CHOLECYSTECTOMY  01/10/2012   Procedure:  LAPAROSCOPIC CHOLECYSTECTOMY;  Surgeon: Oneil DELENA Budge, MD;  Location: [facility];  Service: General;  Laterality: N/A;  attempted Intraopertive cholangiogram   TOE SURGERY Left    02/2021   TONSILLECTOMY      Current Medications: Active Medications[1]   Allergies:   Ceftriaxone, Lactose intolerance (gi), Latex, and Trazodone  and nefazodone   Social History   Socioeconomic History   Marital status: Single    Spouse name: Not on file   Number of children: 2   Years of education: Not on file   Highest education level: Some college, no degree  Occupational History   Occupation: works at group home  Tobacco Use   Smoking status: Former    Current packs/day: 0.25    Average packs/day: 0.3 packs/day for 5.0 years (1.3 ttl pk-yrs)    Types: Cigarettes    Passive exposure: Past   Smokeless tobacco: Never   Tobacco comments:    Quit smoking x6 months ago  Vaping Use   Vaping status: Former  Substance and Sexual Activity   Alcohol use: Not Currently    Comment: occ   Drug use: Yes    Frequency: 5.0 times per week    Types: Marijuana    Comment: a little bit   Sexual activity:  Not Currently    Birth control/protection: I.U.D., Abstinence  Other Topics Concern   Not on file  Social History Narrative   Not on file   Social Drivers of Health   Tobacco Use: Medium Risk (07/01/2024)   Patient History    Smoking Tobacco Use: Former    Smokeless Tobacco Use: Never    Passive Exposure: Past  Physicist, Medical Strain: Medium Risk (06/29/2024)   Overall Financial Resource Strain (CARDIA)    Difficulty of Paying Living Expenses: Somewhat hard  Food Insecurity: Food Insecurity Present (06/29/2024)   Epic    Worried About Programme Researcher, Broadcasting/film/video in the Last Year: Sometimes true    Ran Out of Food in the Last Year: Sometimes true  Transportation Needs: No Transportation Needs (06/29/2024)   Epic    Lack of Transportation (Medical): No    Lack of Transportation (Non-Medical): No  Physical  Activity: Sufficiently Active (06/29/2024)   Exercise Vital Sign    Days of Exercise per Week: 5 days    Minutes of Exercise per Session: 80 min  Stress: Stress Concern Present (06/29/2024)   [employer] of Occupational Health - Occupational Stress Questionnaire    Feeling of Stress: Rather much  Social Connections: Socially Integrated (06/29/2024)   Social Connection and Isolation Panel    Frequency of Communication with Friends and Family: More than three times a week    Frequency of Social Gatherings with Friends and Family: Once a week    Attends Religious Services: More than 4 times per year    Active Member of Clubs or Organizations: Yes    Attends Banker Meetings: More than 4 times per year    Marital Status: Living with partner  Depression (PHQ2-9): Medium Risk (02/18/2022)   Depression (PHQ2-9)    PHQ-2 Score: 8  Alcohol Screen: Low Risk (06/29/2024)   Alcohol Screen    Last Alcohol Screening Score (AUDIT): 3  Housing: High Risk (06/29/2024)   Epic    Unable to Pay for Housing in the Last Year: Yes    Number of Times Moved in the Last Year: 0    Homeless in the Last Year: No  Utilities: Not At Risk (05/23/2023)   AHC Utilities    Threatened with loss of utilities: No  Health Literacy: Not on file     Family History: The patient's family history includes Cancer in an other family member; Yvone' disease in her father; Hypertension in an other family member; Nephrolithiasis in her father; Parkinson's disease in her father; Thyroid  disease in her father.  ROS:   Review of Systems  Constitution: Negative for decreased appetite, fever and weight gain.  HENT: Negative for congestion, ear discharge, hoarse voice and sore throat.   Eyes: Negative for discharge, redness, vision loss in right eye and visual halos.  Cardiovascular: Negative for chest pain, dyspnea on exertion, leg swelling, orthopnea and palpitations.  Respiratory: Negative for cough, hemoptysis,  shortness of breath and snoring.   Endocrine: Negative for heat intolerance and polyphagia.  Hematologic/Lymphatic: Negative for bleeding problem. Does not bruise/bleed easily.  Skin: Negative for flushing, nail changes, rash and suspicious lesions.  Musculoskeletal: Negative for arthritis, joint pain, muscle cramps, myalgias, neck pain and stiffness.  Gastrointestinal: Negative for abdominal pain, bowel incontinence, diarrhea and excessive appetite.  Genitourinary: Negative for decreased libido, genital sores and incomplete emptying.  Neurological: Negative for brief paralysis, focal weakness, headaches and loss of balance.  Psychiatric/Behavioral: Negative for altered mental status, depression and suicidal  ideas.  Allergic/Immunologic: Negative for HIV exposure and persistent infections.    EKGs/Labs/Other Studies Reviewed:    The following studies were reviewed today:   EKG:  The ekg ordered today demonstrates   Recent Labs: 06/30/2024: ALT 18; BUN 10; Creatinine, Ser 0.35; Magnesium  1.7; Potassium 4.4; Sodium 141; TSH <0.005  Recent Lipid Panel    Component Value Date/Time   CHOL 142 06/30/2024 1047   TRIG 84 06/30/2024 1047   HDL 38 (L) 06/30/2024 1047   CHOLHDL 3.7 06/30/2024 1047   LDLCALC 88 06/30/2024 1047    Physical Exam:    VS:  BP (!) 151/81 (BP Location: Left Arm, Patient Position: Sitting)   Pulse (!) 105   Ht 5' 10 (1.778 m)   Wt (!) 304 lb 3.2 oz (138 kg)   SpO2 97%   BMI 43.65 kg/m     Wt Readings from Last 3 Encounters:  07/01/24 (!) 307 lb (139.3 kg)  06/30/24 (!) 304 lb 3.2 oz (138 kg)  06/16/23 288 lb (130.6 kg)     GEN: Well nourished, well developed in no acute distress HEENT: Normal NECK: No JVD; No carotid bruits LYMPHATICS: No lymphadenopathy CARDIAC: S1S2 noted,RRR, no murmurs, rubs, gallops RESPIRATORY:  Clear to auscultation without rales, wheezing or rhonchi  ABDOMEN: Soft, non-tender, non-distended, +bowel sounds, no  guarding. EXTREMITIES: No edema, No cyanosis, no clubbing MUSCULOSKELETAL:  No deformity  SKIN: Warm and dry NEUROLOGIC:  Alert and oriented x 3, non-focal PSYCHIATRIC:  Normal affect, good insight  ASSESSMENT:    1. Chronic diastolic heart failure (HCC)   2. Medication management   3. Hypothyroidism, unspecified type   4. Encounter to establish care with new provider   5. DOE (dyspnea on exertion)   6. Screening for hyperlipidemia    PLAN:     1.  Dyspnea exertion-order an echo, She has not had lab work done in many months we will go ahead and get CBC, BMP, given her palpitations will get thyroid  function test as well.  She needs screening for lipid profile as well as for diabetes.  She has been off the propranolol  for some time now.  And she is hypertensive in office.  Will restart propranolol  but at 40 mg twice daily. Will refill her diuretics as well Advised the patient to see a whole endocrinologist and I going to refer her to her PCP  The patient understands the need to lose weight with diet and exercise. We have discussed specific strategies for this.  The patient is in agreement with the above plan. The patient left the office in stable condition.  The patient will follow up in   Medication Adjustments/Labs and Tests Ordered: Current medicines are reviewed at length with the patient today.  Concerns regarding medicines are outlined above.  Orders Placed This Encounter  Procedures   Comp Met (CMET)   Magnesium    Lipid panel   TSH+T4F+T3Free   Ambulatory referral to Family Practice   EKG 12-Lead   ECHOCARDIOGRAM COMPLETE   Meds ordered this encounter  Medications   propranolol  (INDERAL ) 40 MG tablet    Sig: Take 1 tablet (40 mg total) by mouth 2 (two) times daily.    Dispense:  180 tablet    Refill:  3    Patient Instructions  Medication Instructions:  Your physician has recommended you make the following change in your medication:  START: Propranolol  40 mg  twice daily *If you need a refill on your cardiac medications before your next  appointment, please call your pharmacy*  Lab Work: CMET, Mag, Lipids, TSH If you have labs (blood work) drawn today and your tests are completely normal, you will receive your results only by: MyChart Message (if you have MyChart) OR A paper copy in the mail If you have any lab test that is abnormal or we need to change your treatment, we will call you to review the results.  Testing/Procedures: Your physician has requested that you have an echocardiogram. Echocardiography is a painless test that uses sound waves to create images of your heart. It provides your doctor with information about the size and shape of your heart and how well your hearts chambers and valves are working. This procedure takes approximately one hour. There are no restrictions for this procedure. Please do NOT wear cologne, perfume, aftershave, or lotions (deodorant is allowed). Please arrive 15 minutes prior to your appointment time.  Please note: We ask at that you not bring children with you during ultrasound (echo/ vascular) testing. Due to room size and safety concerns, children are not allowed in the ultrasound rooms during exams. Our front office staff cannot provide observation of children in our lobby area while testing is being conducted. An adult accompanying a patient to their appointment will only be allowed in the ultrasound room at the discretion of the ultrasound technician under special circumstances. We apologize for any inconvenience.   Follow-Up: At [facility], you and your health needs are our priority.  As part of our continuing mission to provide you with exceptional heart care, our providers are all part of one team.  This team includes your primary Cardiologist (physician) and Advanced Practice Providers or APPs (Physician Assistants and Nurse Practitioners) who all work together to provide you with the care  you need, when you need it.  Your next appointment:   6 month(s)  Provider:   Garnette Greb, DO    Other Instructions: [facility] Medicine [address] [place],  [place]  [address]  Get Driving Directions Main: [phone]  Fax: [phone]            Adopting a Healthy Lifestyle.  Know what a healthy weight is for you (roughly BMI <25) and aim to maintain this   Aim for 7+ servings of fruits and vegetables daily   65-80+ fluid ounces of water or unsweet tea for healthy kidneys   Limit to max 1 drink of alcohol per day; avoid smoking/tobacco   Limit animal fats in diet for cholesterol and heart health - choose grass fed whenever available   Avoid highly processed foods, and foods high in saturated/trans fats   Aim for low stress - take time to unwind and care for your mental health   Aim for 150 min of moderate intensity exercise weekly for heart health, and weights twice weekly for bone health   Aim for 7-9 hours of sleep daily   When it comes to diets, agreement about the perfect plan isnt easy to find, even among the experts. Experts at the [facility] [employer] developed an idea known as the Healthy Eating Plate. Just imagine a plate divided into logical, healthy portions.   The emphasis is on diet quality:   Load up on vegetables and fruits - one-half of your plate: Aim for color and variety, and remember that potatoes dont count.   Go for whole grains - one-quarter of your plate: Whole wheat, barley, wheat berries, quinoa, oats, brown rice, and  foods made with them. If you want pasta, go with whole wheat pasta.   Protein power - one-quarter of your plate: Fish, chicken, beans, and nuts are all healthy, versatile protein sources. Limit red meat.   The diet, however, does go beyond the plate, offering a few other suggestions.   Use healthy plant oils, such as olive, canola, soy, corn, sunflower and peanut. Check the  labels, and avoid partially hydrogenated oil, which have unhealthy trans fats.   If youre thirsty, drink water. Coffee and tea are good in moderation, but skip sugary drinks and limit milk and dairy products to one or two daily servings.   The type of carbohydrate in the diet is more important than the amount. Some sources of carbohydrates, such as vegetables, fruits, whole grains, and beans-are healthier than others.   Finally, stay active  Signed, Dyrell Tuccillo, DO  07/03/2024 4:13 PM    [facility]     [1]  Current Meds  Medication Sig   Aspirin-Acetaminophen  (GOODYS BODY PAIN PO) Take 1 packet by mouth 2 (two) times daily as needed (headache/pain).   fluconazole  (DIFLUCAN ) 150 MG tablet TAKE 1 TABLET NOW AND 1 TABLET IN 3 DAYS IF NEEDED   ibuprofen  (ADVIL ) 200 MG tablet Take 400 mg by mouth every 6 (six) hours as needed for mild pain (pain score 1-3) or moderate pain (pain score 4-6).   LYSINE PO Take 1 tablet by mouth every Monday, Wednesday, and Friday.   metroNIDAZOLE  (FLAGYL ) 500 MG tablet TAKE 1 TABLET BY MOUTH TWICE A DAY (Patient not taking: Reported on 07/01/2024)   Multiple Vitamins-Minerals (HAIR SKIN AND NAILS FORMULA) TABS Take 1 tablet by mouth daily.   propranolol  (INDERAL ) 40 MG tablet Take 1 tablet (40 mg total) by mouth 2 (two) times daily.   valACYclovir  (VALTREX ) 1000 MG tablet Take 1 tablet (1,000 mg total) by mouth 2 (two) times daily.   [DISCONTINUED] furosemide  (LASIX ) 40 MG tablet TAKE 1 TABLET BY MOUTH TWICE A DAY (Patient not taking: Reported on 07/01/2024)   [DISCONTINUED] methimazole  (TAPAZOLE ) 10 MG tablet Take 1 tablet (10 mg total) by mouth 2 (two) times daily.   [DISCONTINUED] potassium chloride  SA (KLOR-CON  M) 20 MEQ tablet Take 1 tablet (20 mEq total) by mouth daily. (Patient not taking: Reported on 07/01/2024)   [DISCONTINUED] propranolol  (INDERAL ) 20 MG tablet Take 1 tablet (20 mg total) by mouth 2 (two) times daily.   "

## 2024-07-01 ENCOUNTER — Encounter: Payer: Self-pay | Admitting: Nurse Practitioner

## 2024-07-01 ENCOUNTER — Ambulatory Visit (INDEPENDENT_AMBULATORY_CARE_PROVIDER_SITE_OTHER): Admitting: Nurse Practitioner

## 2024-07-01 ENCOUNTER — Encounter: Payer: Self-pay | Admitting: Cardiology

## 2024-07-01 VITALS — BP 136/78 | HR 91 | Ht 70.0 in | Wt 307.0 lb

## 2024-07-01 DIAGNOSIS — Z8349 Family history of other endocrine, nutritional and metabolic diseases: Secondary | ICD-10-CM | POA: Diagnosis not present

## 2024-07-01 DIAGNOSIS — E0500 Thyrotoxicosis with diffuse goiter without thyrotoxic crisis or storm: Secondary | ICD-10-CM

## 2024-07-01 DIAGNOSIS — E0590 Thyrotoxicosis, unspecified without thyrotoxic crisis or storm: Secondary | ICD-10-CM

## 2024-07-01 MED ORDER — METHIMAZOLE 10 MG PO TABS: 10.0000 mg | ORAL_TABLET | Freq: Two times a day (BID) | ORAL | 1 refills | Status: AC

## 2024-07-01 NOTE — Progress Notes (Signed)
 "         07/01/2024     Endocrinology Follow Up Note    Subjective:    Patient ID: Pamela [name], female    DOB: 01-29-1986, PCP Pcp, No.   Past Medical History:  Diagnosis Date   Abnormal antenatal AFP screen    US  and [place] was low risk.   Anxiety    Congestive heart disease (HCC)    Congestive heart failure (CHF) (HCC)    Genital herpes    Gonorrhea    Graves disease    History of abnormal Pap smear    History of postpartum depression, currently pregnant    HSV-2 infection complicating pregnancy    Will suppress at 34 weeks. Don't discuss in front of FOB.   Mental disorder    hx pp depression now anxiety   Papanicolaou smear of cervix with positive high risk human papilloma virus (HPV) test 02/26/2021   02/26/21 repeat pap in 1 year per [facility], 5 year risk for CIN3+ is 4.8%   Pregnant    Tobacco abuse 01/10/2012    Past Surgical History:  Procedure Laterality Date   CESAREAN SECTION  03/04/2011   Procedure: CESAREAN SECTION;  Surgeon: Burnard HILARIO Pate, MD;  Location: [facility];  Service: Gynecology;  Laterality: N/A;  primary of baby  boy at 35   CESAREAN SECTION N/A 10/27/2012   Procedure: CESAREAN SECTION;  Surgeon: Harland JAYSON Birkenhead, MD;  Location: [facility];  Service: Obstetrics;  Laterality: N/A;   CHOLECYSTECTOMY  01/10/2012   Procedure: LAPAROSCOPIC CHOLECYSTECTOMY;  Surgeon: Oneil DELENA Budge, MD;  Location: [facility];  Service: General;  Laterality: N/A;  attempted Intraopertive cholangiogram   TOE SURGERY Left    02/2021   TONSILLECTOMY      Social History   Socioeconomic History   Marital status: Single    Spouse name: Not on file   Number of children: 2   Years of education: Not on file   Highest education level: Some college, no degree  Occupational History   Occupation: works at group home  Tobacco Use   Smoking status: Former    Current packs/day: 0.25    Average packs/day: 0.3 packs/day for 5.0 years (1.3 ttl pk-yrs)    Types: Cigarettes    Passive  exposure: Past   Smokeless tobacco: Never   Tobacco comments:    Quit smoking x6 months ago  Vaping Use   Vaping status: Former  Substance and Sexual Activity   Alcohol use: Not Currently    Comment: occ   Drug use: Yes    Frequency: 5.0 times per week    Types: Marijuana    Comment: a little bit   Sexual activity: Not Currently    Birth control/protection: I.U.D., Abstinence  Other Topics Concern   Not on file  Social History Narrative   Not on file   Social Drivers of Health   Tobacco Use: Medium Risk (07/01/2024)   Patient History    Smoking Tobacco Use: Former    Smokeless Tobacco Use: Never    Passive Exposure: Past  Physicist, Medical Strain: Medium Risk (06/29/2024)   Overall Financial Resource Strain (CARDIA)    Difficulty of Paying Living Expenses: Somewhat hard  Food Insecurity: Food Insecurity Present (06/29/2024)   Epic    Worried About Programme Researcher, Broadcasting/film/video in the Last Year: Sometimes true    Ran Out of Food in the Last Year: Sometimes true  Transportation Needs: No Transportation Needs (06/29/2024)  Epic    Lack of Transportation (Medical): No    Lack of Transportation (Non-Medical): No  Physical Activity: Sufficiently Active (06/29/2024)   Exercise Vital Sign    Days of Exercise per Week: 5 days    Minutes of Exercise per Session: 80 min  Stress: Stress Concern Present (06/29/2024)   [employer] of Occupational Health - Occupational Stress Questionnaire    Feeling of Stress: Rather much  Social Connections: Socially Integrated (06/29/2024)   Social Connection and Isolation Panel    Frequency of Communication with Friends and Family: More than three times a week    Frequency of Social Gatherings with Friends and Family: Once a week    Attends Religious Services: More than 4 times per year    Active Member of Clubs or Organizations: Yes    Attends Banker Meetings: More than 4 times per year    Marital Status: Living with partner   Depression (PHQ2-9): Medium Risk (02/18/2022)   Depression (PHQ2-9)    PHQ-2 Score: 8  Alcohol Screen: Low Risk (06/29/2024)   Alcohol Screen    Last Alcohol Screening Score (AUDIT): 3  Housing: High Risk (06/29/2024)   Epic    Unable to Pay for Housing in the Last Year: Yes    Number of Times Moved in the Last Year: 0    Homeless in the Last Year: No  Utilities: Not At Risk (05/23/2023)   AHC Utilities    Threatened with loss of utilities: No  Health Literacy: Not on file    Family History  Problem Relation Age of Onset   Nephrolithiasis Father    Yvone' disease Father    Thyroid  disease Father    Parkinson's disease Father    Hypertension Other    Cancer Other        colon    Outpatient Encounter Medications as of 07/01/2024  Medication Sig   Aspirin-Acetaminophen  (GOODYS BODY PAIN PO) Take 1 packet by mouth 2 (two) times daily as needed (headache/pain).   fluconazole  (DIFLUCAN ) 150 MG tablet TAKE 1 TABLET NOW AND 1 TABLET IN 3 DAYS IF NEEDED   ibuprofen  (ADVIL ) 200 MG tablet Take 400 mg by mouth every 6 (six) hours as needed for mild pain (pain score 1-3) or moderate pain (pain score 4-6).   LYSINE PO Take 1 tablet by mouth every Monday, Wednesday, and Friday.   Multiple Vitamins-Minerals (HAIR SKIN AND NAILS FORMULA) TABS Take 1 tablet by mouth daily.   propranolol  (INDERAL ) 40 MG tablet Take 1 tablet (40 mg total) by mouth 2 (two) times daily.   valACYclovir  (VALTREX ) 1000 MG tablet Take 1 tablet (1,000 mg total) by mouth 2 (two) times daily.   [DISCONTINUED] methimazole  (TAPAZOLE ) 10 MG tablet Take 1 tablet (10 mg total) by mouth 2 (two) times daily.   furosemide  (LASIX ) 40 MG tablet TAKE 1 TABLET BY MOUTH TWICE A DAY (Patient not taking: Reported on 07/01/2024)   methimazole  (TAPAZOLE ) 10 MG tablet Take 1 tablet (10 mg total) by mouth 2 (two) times daily.   metroNIDAZOLE  (FLAGYL ) 500 MG tablet TAKE 1 TABLET BY MOUTH TWICE A DAY (Patient not taking: Reported on 07/01/2024)    potassium chloride  SA (KLOR-CON  M) 20 MEQ tablet Take 1 tablet (20 mEq total) by mouth daily. (Patient not taking: Reported on 07/01/2024)   No facility-administered encounter medications on file as of 07/01/2024.    ALLERGIES: Allergies  Allergen Reactions   Ceftriaxone Hives, Shortness Of Breath and Itching  Rocephin; Penicillins OK   Lactose Intolerance (Gi) Other (See Comments)    GI upset   Latex Itching and Other (See Comments)    discoloring of skin   Trazodone  And Nefazodone Other (See Comments)    Causes nightmares    VACCINATION STATUS: Immunization History  Administered Date(s) Administered   Influenza-Unspecified 05/18/2013   PNEUMOCOCCAL CONJUGATE-20 09/04/2021   Tdap 03/05/2011, 10/28/2012     HPI  Pamela [name] is 39 y.o. female who presents today with a medical history as above. 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  she is being seen in follow up after being seen in consultation for hyperthyroidism requested by Delon Lewis, NP.  she has been dealing with symptoms of insomnia, anxiety, irritability, dry irritated eyes, blurred vision, decreased appetite, inability to lose weight, dry itchy skin, tremors, shortness of breath, and sense of choking or clearing throat since first diagnosed in 2023.  These symptoms are progressively worsening and troubling to her.  her most recent thyroid  labs revealed suppressed TSH of 0.01 and high FT4 of >5.5 and elevated FT3 of 17.5 on 09/02/21.  She has suppressed TSH dating back to 2014.  She is not currently on any treatment for hyperthyroidism.  She notes she was on Propanolol for symptom management in the past but has not taken in quite some time.  She also notes she may have been on Methimazole  at one point as well.  She does note she has been self-medicating her symptoms with marijuana use.    she does have family history of thyroid  dysfunction in her father Alvia Disease) and an  aunt (Graves Disease), but denies family hx of thyroid  cancer. she denies personal history of goiter. she is not on any anti-thyroid  medications nor on any thyroid  hormone supplements. Denies use of Biotin containing supplements.  she is willing to proceed with appropriate work up and therapy for thyrotoxicosis.  She has a history of CHF, depression, and anxiety.  07/01/24- patient disappeared from care over a year ago.  She recently saw her cardiologist who recommended she re-establish care here for management of her hyperthyroidism.  Her Propanolol was refilled with her cardiologist yesterday.  She had previously had the uptake and scan completed, confirming suspicion of Graves disease.  We even went so far to discuss treatment options and at first she was favoring RAI ablation but then changed her mind to the surgery.      Review of systems  Constitutional: + increasing body weight-despite working out at [employer], current Body mass index is 44.05 kg/m., + fatigue, no subjective hyperthermia, no subjective hypothermia Eyes: + blurry vision, no xerophthalmia, + dry irritated eyes ENT: no sore throat, no nodules palpated in throat, + intermittent dysphagia/odynophagia with frequent throat clearing, no hoarseness Cardiovascular: no chest pain, + shortness of breath, + palpitations Respiratory: no cough, + shortness of breath Gastrointestinal: no nausea/vomiting/diarrhea Musculoskeletal: no muscle/joint aches Skin: no rashes, no hyperemia, + dry itchy skin Neurological: + tremors, no numbness, no tingling, no dizziness Psychiatric: no depression, + anxiety with panic attacks   Objective:    BP 136/78 (BP Location: Right Arm, Patient Position: Sitting)   Pulse 91   Ht 5' 10 (1.778 m)   Wt (!) 307 lb (139.3 kg)   BMI 44.05 kg/m   Wt Readings from Last 3 Encounters:  07/01/24 (!) 307 lb (139.3 kg)  06/30/24 (!) 304 lb 3.2 oz (138 kg)  06/16/23 288  lb (130.6 kg)     BP Readings from  Last 3 Encounters:  07/01/24 136/78  06/30/24 (!) 151/81  06/16/23 (!) 155/94                        Physical Exam- Limited  Constitutional:  Body mass index is 44.05 kg/m. , not in acute distress, + anxious/tearful state of mind Eyes:  EOMI, no exophthalmos Cardiovascular: RRR, no murmurs, rubs, or gallops Respiratory: Adequate breathing efforts, no crackles, rales, rhonchi, or wheezing Musculoskeletal: no gross deformities, strength intact in all four extremities, no gross restriction of joint movements Skin:  no rashes, no hyperemia Neurological: + +tremor with outstretched hands, DTR hyperactive in BLE   CMP     Component Value Date/Time   NA 141 06/30/2024 1047   K 4.4 06/30/2024 1047   CL 106 06/30/2024 1047   CO2 23 06/30/2024 1047   GLUCOSE 96 06/30/2024 1047   GLUCOSE 104 (H) 05/25/2023 0312   BUN 10 06/30/2024 1047   CREATININE 0.35 (L) 06/30/2024 1047   CREATININE 0.68 11/11/2012 1331   CALCIUM  9.2 06/30/2024 1047   PROT 6.2 06/30/2024 1047   ALBUMIN 3.6 (L) 06/30/2024 1047   AST 16 06/30/2024 1047   ALT 18 06/30/2024 1047   ALKPHOS 87 06/30/2024 1047   BILITOT 0.4 06/30/2024 1047   EGFR 136 06/30/2024 1047   GFRNONAA >60 05/25/2023 0312     CBC    Component Value Date/Time   WBC 9.9 05/25/2023 0312   RBC 4.23 05/25/2023 0312   HGB 11.7 (L) 05/25/2023 0312   HGB 13.6 04/07/2019 1031   HCT 36.4 05/25/2023 0312   HCT 39.3 04/07/2019 1031   PLT 304 05/25/2023 0312   PLT 347 04/07/2019 1031   MCV 86.1 05/25/2023 0312   MCV 88 04/07/2019 1031   MCH 27.7 05/25/2023 0312   MCHC 32.1 05/25/2023 0312   RDW 11.6 05/25/2023 0312   RDW 12.5 04/07/2019 1031   LYMPHSABS 1.9 09/03/2021 0910   MONOABS 0.8 09/03/2021 0910   EOSABS 0.3 09/03/2021 0910   BASOSABS 0.0 09/03/2021 0910     Diabetic Labs (most recent): Lab Results  Component Value Date   HGBA1C 6.0 (H) 05/16/2021   HGBA1C 5.7 (H) 04/07/2019    Lipid Panel     Component Value Date/Time    CHOL 142 06/30/2024 1047   TRIG 84 06/30/2024 1047   HDL 38 (L) 06/30/2024 1047   CHOLHDL 3.7 06/30/2024 1047   LDLCALC 88 06/30/2024 1047   LABVLDL 16 06/30/2024 1047     Lab Results  Component Value Date   TSH <0.005 (L) 06/30/2024   TSH <0.010 (L) 05/23/2023   TSH <0.010 (L) 05/22/2023   TSH <0.005 (L) 04/08/2023   TSH 0.001 (L) 09/02/2021   TSH 0.261 (L) 11/11/2012   TSH 0.440 01/09/2012   FREET4 5.96 (H) 06/30/2024   FREET4 >5.50 (H) 05/22/2023   FREET4 2.11 (H) 04/08/2023   FREET4 >5.50 (H) 09/02/2021   FREET4 1.16 01/09/2012    Thyroid  US  from 09/02/21 CLINICAL DATA:  Thyromegaly , low TSH for   EXAM: THYROID  ULTRASOUND   TECHNIQUE: Ultrasound examination of the thyroid  gland and adjacent soft tissues was performed.   COMPARISON:  CT 09/02/2021 and previous   FINDINGS: Parenchymal Echotexture: Markedly heterogenous, hyperemic   Isthmus: 0.8 cm thickness   Right lobe: 7.8 x 3.1 x 3.7 cm   Left lobe: 7.9 x 3.1 x 2.9 cm  _________________________________________________________   Estimated total number of nodules >/= 1 cm: 0   Number of spongiform nodules >/=  2 cm not described below (TR1): 0   Number of mixed cystic and solid nodules >/= 1.5 cm not described below (TR2): 0   _________________________________________________________   No discrete nodules are seen within the thyroid  gland. No regional cervical adenopathy identified.   IMPRESSION: 1. Thyromegaly with heterogenous hyperemic parenchyma. 2. No nodule or other indication for biopsy or further imaging evaluation.   The above is in keeping with the ACR TI-RADS recommendations - J Am Coll Radiol 2017;14:587-595.     Electronically Signed   By: JONETTA Faes M.D.   On: 09/03/2021 07:33   Uptake and Scan results from 04/09/23 CLINICAL DATA:  Hyperthyroidism. Symptoms include neck swelling, anxiety, weight gain, cold intolerance, difficulty sleeping, tremors, 1 skin, hair loss,  courses and palpitations. At site of thalamus and LEFT EG also reported.   TSH equal less than 0.005.   EXAM: THYROID  SCAN AND UPTAKE - 4 AND 24 HOURS   TECHNIQUE: Following oral administration of I-123 capsule, anterior planar imaging was acquired at 24 hours. Thyroid  uptake was calculated with a thyroid  probe at 4-6 hours and 24 hours.   RADIOPHARMACEUTICALS:  452.0 uCi I-123 sodium iodide p.o.   COMPARISON:  None Available.   FINDINGS: Uniform uptake within enlarged thyroid  gland.  No nodularity.   4 hour I-123 uptake = 37.1% (normal 5-20%)   24 hour I-123 uptake = 50.7% (normal 10-30%)   IMPRESSION: Imaging findings, symptomology, laboratory values, and I 123 uptake are most consistent with Graves disease.     Electronically Signed   By: Jackquline Boxer M.D.   On: 04/25/2023 16:57    Latest Reference Range & Units 09/02/21 23:57 04/08/23 09:50 05/22/23 17:32 05/23/23 14:18 06/30/24 10:47  TSH 0.450 - 4.500 uIU/mL  <0.005 (L) <0.010 (L) <0.010 (L) <0.005 (L)  Triiodothyronine,Free,Serum 2.0 - 4.4 pg/mL 17.5 (H) 5.6 (H)   22.0 (HH)  T4,Free(Direct) 0.82 - 1.77 ng/dL  7.88 (H) >4.49 (H)  4.03 (H)  Thyroperoxidase Ab SerPl-aCnc 0 - 34 IU/mL  133 (H)     Thyroglobulin Antibody 0.0 - 0.9 IU/mL  2.1 (H)     (HH): Data is critically high (L): Data is abnormally low (H): Data is abnormally high   Assessment & Plan:   1. Hyperthyroidism- suspect autoimmune related due to positive family history 2. Family history of Graves disease  she is being seen at a kind request of Delon Lewis, NP  her history and most recent labs are reviewed, and she was examined clinically. Subjective and objective findings are consistent with thyrotoxicosis likely from primary hyperthyroidism. The potential risks of untreated thyrotoxicosis and the need for definitive therapy have been discussed in detail with her, and she agrees to proceed with diagnostic workup and treatment plan.   I  will restart her Methimazole  10 mg po twice daily.  Will repeat labs in 7 weeks and follow up in office in 8 weeks to discuss results and next steps.  She is leaning more towards RAI ablation as treatment.  She is aware that we may need to repeat uptake and scan since it has been over 1 year since this was done.    Options of therapy are discussed with her.  We discussed the option of treating it with medications including methimazole  or PTU which may have side effects including rash, transaminitis, and bone marrow suppression.  We also discussed the  option of definitive therapy with RAI ablation of the thyroid . If she is found to have primary hyperthyroidism from Graves' disease , toxic multinodular goiter or toxic nodular goiter the preferred modality of treatment would be I-131 thyroid  ablation. Surgery is another choice of treatment in some cases, in her case surgery may not be a good fit for given her CHF history.  -Patient is made aware of the high likelihood of post ablative hypothyroidism with subsequent need for lifelong thyroid  hormone replacement. she understands this outcome and she is willing to proceed.     She did ask me for refill on her Lasix  today, I will defer that to cardiology.  -Patient is advised to maintain close follow up with Pcp, No for primary care needs.    I spent  29  minutes in the care of the patient today including review of labs from Thyroid  Function, CMP, and other relevant labs ; imaging/biopsy records (current and previous including abstractions from other facilities); face-to-face time discussing  her lab results and symptoms, medications doses, her options of short and long term treatment based on the latest standards of care / guidelines;   and documenting the encounter.  Warren LITTIE Novak  participated in the discussions, expressed understanding, and voiced agreement with the above plans.  All questions were answered to her satisfaction. she is encouraged to contact  clinic should she have any questions or concerns prior to her return visit.   Follow up plan: Return in about 8 weeks (around 08/26/2024) for Thyroid  follow up, Previsit labs.   Thank you for involving me in the care of this pleasant patient, and I will continue to update you with her progress.   Benton Rio, [facility] [address] [place], [place] [address] Phone: [phone] Fax: [phone]  07/01/2024, 11:54 AM  "

## 2024-07-02 ENCOUNTER — Ambulatory Visit: Payer: Self-pay | Admitting: Cardiology

## 2024-07-02 ENCOUNTER — Other Ambulatory Visit: Payer: Self-pay

## 2024-07-02 MED ORDER — FUROSEMIDE 40 MG PO TABS: 40.0000 mg | ORAL_TABLET | Freq: Two times a day (BID) | ORAL | 1 refills | Status: AC

## 2024-07-02 MED ORDER — POTASSIUM CHLORIDE CRYS ER 20 MEQ PO TBCR: 20.0000 meq | EXTENDED_RELEASE_TABLET | Freq: Every day | ORAL | 1 refills | Status: AC

## 2024-07-02 NOTE — Telephone Encounter (Signed)
 Refills for Lasix  and Potassium sent per pt's request and Dr. Ginette approval.

## 2024-07-06 ENCOUNTER — Ambulatory Visit (HOSPITAL_COMMUNITY)
Admission: RE | Admit: 2024-07-06 | Discharge: 2024-07-06 | Disposition: A | Source: Ambulatory Visit | Attending: Internal Medicine | Admitting: Internal Medicine

## 2024-07-06 DIAGNOSIS — R0609 Other forms of dyspnea: Secondary | ICD-10-CM

## 2024-07-06 LAB — ECHOCARDIOGRAM COMPLETE
Area-P 1/2: 3.92 cm2
S' Lateral: 3.7 cm

## 2024-08-27 ENCOUNTER — Ambulatory Visit: Admitting: Nurse Practitioner
